# Patient Record
Sex: Male | Born: 1955
Health system: Southern US, Community
[De-identification: ages and names within clinical notes are randomized; demographics above are authoritative.]

## PROBLEM LIST (undated history)

## (undated) DIAGNOSIS — I10 Essential (primary) hypertension: Secondary | ICD-10-CM

## (undated) DIAGNOSIS — T7840XA Allergy, unspecified, initial encounter: Secondary | ICD-10-CM

## (undated) DIAGNOSIS — J189 Pneumonia, unspecified organism: Secondary | ICD-10-CM

## (undated) DIAGNOSIS — R1011 Right upper quadrant pain: Secondary | ICD-10-CM

## (undated) DIAGNOSIS — E785 Hyperlipidemia, unspecified: Secondary | ICD-10-CM

## (undated) DIAGNOSIS — J309 Allergic rhinitis, unspecified: Secondary | ICD-10-CM

## (undated) DIAGNOSIS — E669 Obesity, unspecified: Secondary | ICD-10-CM

## (undated) DIAGNOSIS — K219 Gastro-esophageal reflux disease without esophagitis: Secondary | ICD-10-CM

## (undated) DIAGNOSIS — M1990 Unspecified osteoarthritis, unspecified site: Secondary | ICD-10-CM

## (undated) DIAGNOSIS — J40 Bronchitis, not specified as acute or chronic: Secondary | ICD-10-CM

## (undated) DIAGNOSIS — E8881 Metabolic syndrome: Secondary | ICD-10-CM

## (undated) HISTORY — DX: Bronchitis, not specified as acute or chronic: J40

## (undated) HISTORY — DX: Obesity, unspecified: E66.9

## (undated) HISTORY — DX: Unspecified osteoarthritis, unspecified site: M19.90

## (undated) HISTORY — PX: ORTHOPEDIC SURGERY: SHX850

## (undated) HISTORY — DX: Gastro-esophageal reflux disease without esophagitis: K21.9

## (undated) HISTORY — DX: Hyperlipidemia, unspecified: E78.5

## (undated) HISTORY — DX: Right upper quadrant pain: R10.11

## (undated) HISTORY — PX: SPINE SURGERY: SHX786

## (undated) HISTORY — DX: Essential (primary) hypertension: I10

## (undated) HISTORY — DX: Metabolic syndrome: E88.81

## (undated) HISTORY — DX: Allergic rhinitis, unspecified: J30.9

## (undated) HISTORY — PX: APPENDECTOMY: SHX54

## (undated) HISTORY — PX: COLONOSCOPY: SHX174

## (undated) HISTORY — DX: Allergy, unspecified, initial encounter: T78.40XA

## (undated) HISTORY — DX: Metabolic syndrome: E88.810

---

## 1975-08-24 HISTORY — PX: ORTHOPEDIC SURGERY: SHX850

## 2006-05-09 ENCOUNTER — Ambulatory Visit: Payer: Self-pay | Admitting: Family Medicine

## 2006-06-21 ENCOUNTER — Ambulatory Visit: Payer: Self-pay | Admitting: Gastroenterology

## 2007-10-18 ENCOUNTER — Ambulatory Visit: Payer: Self-pay | Admitting: Internal Medicine

## 2012-01-05 ENCOUNTER — Ambulatory Visit: Payer: Self-pay | Admitting: Family Medicine

## 2012-04-12 DIAGNOSIS — E8881 Metabolic syndrome: Secondary | ICD-10-CM | POA: Insufficient documentation

## 2012-07-04 ENCOUNTER — Ambulatory Visit: Payer: Self-pay | Admitting: Family Medicine

## 2012-12-25 ENCOUNTER — Encounter: Payer: Self-pay | Admitting: *Deleted

## 2013-01-02 ENCOUNTER — Encounter: Payer: Self-pay | Admitting: Cardiovascular Disease

## 2013-01-02 ENCOUNTER — Ambulatory Visit (INDEPENDENT_AMBULATORY_CARE_PROVIDER_SITE_OTHER): Payer: BC Managed Care – PPO | Admitting: Cardiovascular Disease

## 2013-01-02 VITALS — BP 140/88 | HR 78 | Ht 72.0 in | Wt 214.2 lb

## 2013-01-02 DIAGNOSIS — E785 Hyperlipidemia, unspecified: Secondary | ICD-10-CM

## 2013-01-02 DIAGNOSIS — I10 Essential (primary) hypertension: Secondary | ICD-10-CM

## 2013-01-02 DIAGNOSIS — R079 Chest pain, unspecified: Secondary | ICD-10-CM

## 2013-01-02 NOTE — Progress Notes (Signed)
Primary care physician: Dr. Carlynn Purl  HPI  This is a 57 year old man who was referred for evaluation of chest pain and dyspnea. He has no previous cardiac history. He was diagnosed with hypertension recently and was started on lisinopril. This was switched to losartan due to rash. His blood pressure has improved since then. He reports that some of chest pain over the last few months described as aching and heartburn sensation radiating to the left shoulder. This partially improved with omeprazole. This mostly happens at rest and does not worsen with physical activities. He also reports worsening exertional dyspnea over the same time. No orthopnea, PND or lower extremity edema. He is a previous smoker. He has a strong family history of premature coronary artery disease. His mother had myocardial infarction at the age of 59 and subsequently underwent CABG twice. She died of heart disease at 17. His father had 5 cardiac stents. A younger brother had myocardial infarction at age of 71 recently.  Allergies  Allergen Reactions  . Lisinopril     Rash, pins and needles in arms     Current Outpatient Prescriptions on File Prior to Visit  Medication Sig Dispense Refill  . aspirin 81 MG tablet Take 81 mg by mouth daily.      . cyclobenzaprine (FLEXERIL) 10 MG tablet Take 10 mg by mouth every evening.      . fexofenadine-pseudoephedrine (ALLEGRA-D 12 HOUR) 60-120 MG per tablet Take 1 tablet by mouth every 12 (twelve) hours.      . Multiple Vitamin (MULTIVITAMIN) tablet Take 1 tablet by mouth daily.      Marland Kitchen omeprazole (PRILOSEC) 20 MG capsule Take 20 mg by mouth daily.       No current facility-administered medications on file prior to visit.     Past Medical History  Diagnosis Date  . Obesity, unspecified   . Esophageal reflux   . Allergic rhinitis, cause unspecified   . Osteoarthrosis, unspecified whether generalized or localized, unspecified site   . GERD (gastroesophageal reflux disease)   .  Hypertension      Past Surgical History  Procedure Laterality Date  . Orthopedic surgery       Family History  Problem Relation Age of Onset  . Heart disease Mother   . Heart attack Mother   . Heart disease Father   . Hypertension Father      History   Social History  . Marital Status: Married    Spouse Name: N/A    Number of Children: N/A  . Years of Education: N/A   Occupational History  . Not on file.   Social History Main Topics  . Smoking status: Former Smoker -- 1.00 packs/day for 15 years    Types: Cigarettes  . Smokeless tobacco: Not on file  . Alcohol Use: No  . Drug Use: No  . Sexually Active: Not on file   Other Topics Concern  . Not on file   Social History Narrative  . No narrative on file     ROS 10 point review of system was performed. It is negative other than what is mentioned in the history of present illness.  PHYSICAL EXAM   BP 140/88  Pulse 78  Ht 6' (1.829 m)  Wt 214 lb 4 oz (97.183 kg)  BMI 29.05 kg/m2 Constitutional: He is oriented to person, place, and time. He appears well-developed and well-nourished. No distress.  HENT: No nasal discharge.  Head: Normocephalic and atraumatic.  Eyes: Pupils are equal  and round. Right eye exhibits no discharge. Left eye exhibits no discharge.  Neck: Normal range of motion. Neck supple. No JVD present. No thyromegaly present.  Cardiovascular: Normal rate, regular rhythm, normal heart sounds and. Exam reveals no gallop and no friction rub. No murmur heard.  Pulmonary/Chest: Effort normal and breath sounds normal. No stridor. No respiratory distress. He has no wheezes. He has no rales. He exhibits no tenderness.  Abdominal: Soft. Bowel sounds are normal. He exhibits no distension. There is no tenderness. There is no rebound and no guarding.  Musculoskeletal: Normal range of motion. He exhibits no edema and no tenderness.  Neurological: He is alert and oriented to person, place, and time.  Coordination normal.  Skin: Skin is warm and dry. No rash noted. He is not diaphoretic. No erythema. No pallor.  Psychiatric: He has a normal mood and affect. His behavior is normal. Judgment and thought content normal.       EKG: Normal sinus rhythm with no significant ST or T wave changes.   ASSESSMENT AND PLAN

## 2013-01-02 NOTE — Assessment & Plan Note (Signed)
Some of his chest pain seems to be atypical and could be related to GERD. However, he had significant exertional dyspnea with worsening recently. His risk factors for coronary artery disease include hypertension, strong family history of premature CAD and previous tobacco use. Due to that, I recommend further evaluation with a treadmill nuclear stress test. I discussed with the patient the importance of lifestyle changes in order to decrease the chance of future coronary artery disease and cardiovascular events. We discussed the importance of controlling risk factors, healthy diet as well as regular exercise. I also explained to him that a normal stress test does not rule out atherosclerosis.  I asked him to cut down on carbohydrates and processed sugar especially sweet tea which he consumes large amount of. Given his risk factors and family history of premature CAD, I recommend continuing small dose aspirin. I will also request advanced lipid panel given his family history.

## 2013-01-02 NOTE — Assessment & Plan Note (Signed)
Blood pressure is well controlled on current medications. 

## 2013-01-02 NOTE — Patient Instructions (Addendum)
Your physician has requested that you have en exercise stress myoview. For further information please visit [email or web address] Please follow instruction sheet, as given.  Come back for fasting labs.   Follow up after tests

## 2013-01-03 ENCOUNTER — Ambulatory Visit (INDEPENDENT_AMBULATORY_CARE_PROVIDER_SITE_OTHER): Payer: BC Managed Care – PPO

## 2013-01-03 DIAGNOSIS — E785 Hyperlipidemia, unspecified: Secondary | ICD-10-CM

## 2013-01-03 NOTE — Addendum Note (Signed)
Addended by: Kendrick Fries on: 01/03/2013 09:39 AM   Modules accepted: Orders

## 2013-01-05 ENCOUNTER — Ambulatory Visit: Payer: Self-pay | Admitting: Cardiovascular Disease

## 2013-01-05 ENCOUNTER — Other Ambulatory Visit: Payer: Self-pay

## 2013-01-05 DIAGNOSIS — R079 Chest pain, unspecified: Secondary | ICD-10-CM

## 2013-01-05 LAB — LIPID PANEL
HDL: 43 mg/dL (ref >39)
Triglycerides: 109 mg/dL (ref 0–149)

## 2013-01-06 LAB — NMR, LIPOPROFILE
LDL Particle Number: 1720 nmol/L — ABNORMAL HIGH (ref <1000)
LDL Size: 20.5 nm — ABNORMAL LOW (ref >20.5)
LDLC SERPL CALC-MCNC: 141 mg/dL — ABNORMAL HIGH (ref <100)
LP-IR Score: 51 — ABNORMAL HIGH (ref <=45)

## 2013-01-12 ENCOUNTER — Ambulatory Visit: Payer: Self-pay | Admitting: Cardiovascular Disease

## 2013-01-12 ENCOUNTER — Ambulatory Visit (INDEPENDENT_AMBULATORY_CARE_PROVIDER_SITE_OTHER): Payer: BC Managed Care – PPO | Admitting: Cardiovascular Disease

## 2013-01-12 ENCOUNTER — Encounter: Payer: Self-pay | Admitting: Cardiovascular Disease

## 2013-01-12 VITALS — BP 112/82 | HR 60 | Ht 72.0 in | Wt 215.2 lb

## 2013-01-12 DIAGNOSIS — I10 Essential (primary) hypertension: Secondary | ICD-10-CM

## 2013-01-12 DIAGNOSIS — R079 Chest pain, unspecified: Secondary | ICD-10-CM

## 2013-01-12 DIAGNOSIS — R0789 Other chest pain: Secondary | ICD-10-CM

## 2013-01-12 DIAGNOSIS — E785 Hyperlipidemia, unspecified: Secondary | ICD-10-CM

## 2013-01-12 MED ORDER — LOSARTAN POTASSIUM 50 MG PO TABS: 50.0000 mg | ORAL_TABLET | Freq: Every day | ORAL | Status: DC

## 2013-01-12 MED ORDER — ATORVASTATIN CALCIUM 20 MG PO TABS: 20.0000 mg | ORAL_TABLET | Freq: Every day | ORAL | Status: DC

## 2013-01-12 MED ORDER — OMEPRAZOLE 20 MG PO CPDR: 20.0000 mg | DELAYED_RELEASE_CAPSULE | Freq: Every day | ORAL | Status: DC

## 2013-01-12 NOTE — Patient Instructions (Addendum)
Start Atorvastatin 20 mg at bedtime.  Recheck labs (fasting) in 6 weeks.  Follow up in 3 months.

## 2013-01-12 NOTE — Assessment & Plan Note (Signed)
His blood pressure is well controlled on current dose of losartan.

## 2013-01-12 NOTE — Progress Notes (Signed)
Primary care physician: Dr. Carlynn Purl  HPI  This is a 57 year old man who is here today for a followup visit regarding chest pain, dyspnea and hyperlipidemia. He has no previous cardiac history. He was diagnosed with hypertension recently and was started on lisinopril. This was switched to losartan due to rash. His blood pressure has improved since then. He was seen for atypical chest pain and heartburn. This partially improved with omeprazole.  He also reported worsening exertional dyspnea over the same time. No orthopnea, PND or lower extremity edema. He is a previous smoker. He has a strong family history of premature coronary artery disease. His mother had myocardial infarction at the age of 34 and subsequently underwent CABG twice. She died of heart disease at 43. His father had 5 cardiac stents. A younger brother had myocardial infarction at age of 70 recently. He underwent a treadmill nuclear stress test which showed no evidence of ischemia with normal ejection fraction. He was able to exercise for more than 9 minutes with no reproducible chest pain or EKG changes. He is overall feeling better and reports no recurrent chest pain.  Allergies  Allergen Reactions  . Lisinopril     Rash, pins and needles in arms     Current Outpatient Prescriptions on File Prior to Visit  Medication Sig Dispense Refill  . aspirin 81 MG tablet Take 81 mg by mouth daily.      . cyclobenzaprine (FLEXERIL) 10 MG tablet Take 10 mg by mouth as needed.       . fexofenadine-pseudoephedrine (ALLEGRA-D 12 HOUR) 60-120 MG per tablet Take 1 tablet by mouth every 12 (twelve) hours.      . fish oil-omega-3 fatty acids 1000 MG capsule Take 1 g by mouth daily.       Marland Kitchen glucosamine-chondroitin 500-400 MG tablet Take 1 tablet by mouth daily.       Marland Kitchen losartan (COZAAR) 50 MG tablet Take 50 mg by mouth daily.      . Multiple Vitamin (MULTIVITAMIN) tablet Take 1 tablet by mouth daily.      Marland Kitchen omeprazole (PRILOSEC) 20 MG capsule  Take 20 mg by mouth daily.       No current facility-administered medications on file prior to visit.     Past Medical History  Diagnosis Date  . Obesity, unspecified   . Esophageal reflux   . Allergic rhinitis, cause unspecified   . Osteoarthrosis, unspecified whether generalized or localized, unspecified site   . GERD (gastroesophageal reflux disease)   . Hypertension      Past Surgical History  Procedure Laterality Date  . Orthopedic surgery       Family History  Problem Relation Age of Onset  . Heart disease Mother   . Heart attack Mother   . Heart disease Father   . Hypertension Father      History   Social History  . Marital Status: Married    Spouse Name: N/A    Number of Children: N/A  . Years of Education: N/A   Occupational History  . Not on file.   Social History Main Topics  . Smoking status: Former Smoker -- 1.00 packs/day for 15 years    Types: Cigarettes  . Smokeless tobacco: Not on file  . Alcohol Use: No  . Drug Use: No  . Sexually Active: Not on file   Other Topics Concern  . Not on file   Social History Narrative  . No narrative on file  ROS 10 point review of system was performed. It is negative other than what is mentioned in the history of present illness.  PHYSICAL EXAM   BP 112/82  Pulse 60  Ht 6' (1.829 m)  Wt 215 lb 4 oz (97.637 kg)  BMI 29.19 kg/m2 Constitutional: He is oriented to person, place, and time. He appears well-developed and well-nourished. No distress.  HENT: No nasal discharge.  Head: Normocephalic and atraumatic.  Eyes: Pupils are equal and round. Right eye exhibits no discharge. Left eye exhibits no discharge.  Neck: Normal range of motion. Neck supple. No JVD present. No thyromegaly present.  Cardiovascular: Normal rate, regular rhythm, normal heart sounds and. Exam reveals no gallop and no friction rub. No murmur heard.  Pulmonary/Chest: Effort normal and breath sounds normal. No stridor. No  respiratory distress. He has no wheezes. He has no rales. He exhibits no tenderness.  Abdominal: Soft. Bowel sounds are normal. He exhibits no distension. There is no tenderness. There is no rebound and no guarding.  Musculoskeletal: Normal range of motion. He exhibits no edema and no tenderness.  Neurological: He is alert and oriented to person, place, and time. Coordination normal.  Skin: Skin is warm and dry. No rash noted. He is not diaphoretic. No erythema. No pallor.  Psychiatric: He has a normal mood and affect. His behavior is normal. Judgment and thought content normal.       EKG: Normal sinus rhythm with no significant ST or T wave changes.   ASSESSMENT AND PLAN

## 2013-01-12 NOTE — Assessment & Plan Note (Addendum)
His chest pain was likely due to GERD. Nuclear stress test showed no evidence of ischemia. Continue treatment with omeprazole for another month. Due to his risk factors and family history, I recommend that he stays on aspirin 81 mg once daily.

## 2013-01-12 NOTE — Assessment & Plan Note (Signed)
Lab Results  Component Value Date   CHOL 210* 01/03/2013   HDL 43 01/03/2013   LDLCALC 143* 01/03/2013   TRIG 109 01/03/2013   CHOLHDL 4.8 01/03/2013   I did more detailed cholesterol testing which showed an LDL particle number of 1720 with a small LDL particle number of 939 with a slightly small size LDL. All of this, but high risk for future cardiovascular events. Given his strong family history of premature coronary artery disease and based on this, I recommend starting treatment with atorvastatin 20 mg once. Recheck lipid and liver profile in 6 weeks. We also discussed lifestyle changes.

## 2013-01-25 ENCOUNTER — Ambulatory Visit: Payer: Self-pay | Admitting: Family Medicine

## 2013-02-27 ENCOUNTER — Ambulatory Visit (INDEPENDENT_AMBULATORY_CARE_PROVIDER_SITE_OTHER): Payer: BC Managed Care – PPO

## 2013-02-27 DIAGNOSIS — E785 Hyperlipidemia, unspecified: Secondary | ICD-10-CM

## 2013-02-28 LAB — LIPID PANEL
Chol/HDL Ratio: 3.2 ratio units (ref 0.0–5.0)
Triglycerides: 98 mg/dL (ref 0–149)

## 2013-03-01 LAB — NMR, LIPOPROFILE
Cholesterol: 140 mg/dL (ref <200)
LDL Size: 20.4 nm — ABNORMAL LOW (ref >20.5)
Small LDL Particle Number: 542 nmol/L — ABNORMAL HIGH (ref <=527)
Triglycerides by NMR: 108 mg/dL (ref <150)

## 2013-04-17 ENCOUNTER — Encounter: Payer: Self-pay | Admitting: Cardiovascular Disease

## 2013-04-17 ENCOUNTER — Ambulatory Visit (INDEPENDENT_AMBULATORY_CARE_PROVIDER_SITE_OTHER): Payer: BC Managed Care – PPO | Admitting: Cardiovascular Disease

## 2013-04-17 VITALS — BP 121/84 | HR 80 | Ht 72.0 in | Wt 217.2 lb

## 2013-04-17 DIAGNOSIS — E785 Hyperlipidemia, unspecified: Secondary | ICD-10-CM

## 2013-04-17 DIAGNOSIS — I10 Essential (primary) hypertension: Secondary | ICD-10-CM

## 2013-04-17 DIAGNOSIS — R079 Chest pain, unspecified: Secondary | ICD-10-CM

## 2013-04-17 DIAGNOSIS — R0789 Other chest pain: Secondary | ICD-10-CM

## 2013-04-17 LAB — PSA: PSA: NORMAL

## 2013-04-17 LAB — HEMOGLOBIN A1C: Hgb A1c MFr Bld: 5.9 % (ref 4.0–6.0)

## 2013-04-17 MED ORDER — OMEPRAZOLE 20 MG PO CPDR: 20.0000 mg | DELAYED_RELEASE_CAPSULE | Freq: Every day | ORAL | Status: DC

## 2013-04-17 NOTE — Progress Notes (Signed)
Primary care physician: Dr. Carlynn Purl  HPI  This is a 57 year old man who is here today for a followup visit regarding chest pain, hypertension and hyperlipidemia. He has no previous cardiac history. He does a family history of CAD. He was evaluated few months ago for atypical chest pain. He underwent a treadmill nuclear stress test which showed no evidence of ischemia with normal ejection fraction. He was able to exercise for more than 9 minutes with no reproducible chest pain or EKG changes. Chest pain improved with Omeprazole.  He was started on Atorvastatin for hyperlipidemia given his family history of CAD.  He reports brief episodes of chest pain recently at rest and not with physical activities. He has been under stress test after recent death of his father.      Allergies  Allergen Reactions  . Lisinopril     Rash, pins and needles in arms     Current Outpatient Prescriptions on File Prior to Visit  Medication Sig Dispense Refill  . aspirin 81 MG tablet Take 81 mg by mouth daily.      Marland Kitchen atorvastatin (LIPITOR) 20 MG tablet Take 1 tablet (20 mg total) by mouth daily.  30 tablet  6  . cyclobenzaprine (FLEXERIL) 10 MG tablet Take 10 mg by mouth as needed.       . fexofenadine-pseudoephedrine (ALLEGRA-D 12 HOUR) 60-120 MG per tablet Take 1 tablet by mouth every 12 (twelve) hours.      . fish oil-omega-3 fatty acids 1000 MG capsule Take 1 g by mouth daily.       Marland Kitchen glucosamine-chondroitin 500-400 MG tablet Take 1 tablet by mouth daily.       Marland Kitchen losartan (COZAAR) 50 MG tablet Take 1 tablet (50 mg total) by mouth daily.  30 tablet  6  . Multiple Vitamin (MULTIVITAMIN) tablet Take 1 tablet by mouth daily.      Marland Kitchen omeprazole (PRILOSEC) 20 MG capsule Take 1 capsule (20 mg total) by mouth daily.  30 capsule  0   No current facility-administered medications on file prior to visit.     Past Medical History  Diagnosis Date  . Obesity, unspecified   . Esophageal reflux   . Allergic  rhinitis, cause unspecified   . Osteoarthrosis, unspecified whether generalized or localized, unspecified site   . GERD (gastroesophageal reflux disease)   . Hypertension   . Hyperlipidemia      Past Surgical History  Procedure Laterality Date  . Orthopedic surgery       Family History  Problem Relation Age of Onset  . Heart disease Mother   . Heart attack Mother   . Heart disease Father   . Hypertension Father      History   Social History  . Marital Status: Married    Spouse Name: N/A    Number of Children: N/A  . Years of Education: N/A   Occupational History  . Not on file.   Social History Main Topics  . Smoking status: Former Smoker -- 1.00 packs/day for 15 years    Types: Cigarettes  . Smokeless tobacco: Not on file  . Alcohol Use: No  . Drug Use: No  . Sexual Activity: Not on file   Other Topics Concern  . Not on file   Social History Narrative  . No narrative on file      PHYSICAL EXAM   BP 121/84  Ht 6' (1.829 m)  Wt 217 lb 4 oz (98.544 kg)  BMI  29.46 kg/m2 Constitutional: He is oriented to person, place, and time. He appears well-developed and well-nourished. No distress.  HENT: No nasal discharge.  Head: Normocephalic and atraumatic.  Eyes: Pupils are equal and round. Right eye exhibits no discharge. Left eye exhibits no discharge.  Neck: Normal range of motion. Neck supple. No JVD present. No thyromegaly present.  Cardiovascular: Normal rate, regular rhythm, normal heart sounds and. Exam reveals no gallop and no friction rub. No murmur heard.  Pulmonary/Chest: Effort normal and breath sounds normal. No stridor. No respiratory distress. He has no wheezes. He has no rales. He exhibits no tenderness.  Abdominal: Soft. Bowel sounds are normal. He exhibits no distension. There is no tenderness. There is no rebound and no guarding.  Musculoskeletal: Normal range of motion. He exhibits no edema and no tenderness.  Neurological: He is alert and  oriented to person, place, and time. Coordination normal.  Skin: Skin is warm and dry. No rash noted. He is not diaphoretic. No erythema. No pallor.  Psychiatric: He has a normal mood and affect. His behavior is normal. Judgment and thought content normal.       EKG: Normal sinus rhythm with no significant ST or T wave changes.   ASSESSMENT AND PLAN

## 2013-04-17 NOTE — Assessment & Plan Note (Signed)
Lab Results  Component Value Date   CHOL 140 02/27/2013   HDL 43 02/27/2013   LDLCALC 74 02/27/2013   TRIG 98 02/27/2013   CHOLHDL 3.2 02/27/2013   Significant improvement after adding Atorvastatin which is well tolerated so far.

## 2013-04-17 NOTE — Assessment & Plan Note (Signed)
BP is well controlled on Losartan.

## 2013-04-17 NOTE — Assessment & Plan Note (Signed)
Few recurrent episodes at chest pain at rest. Seems to be triggered by anxiety. Recent negative stress test. I discussed the option of proceeding with cardiac cath for a definitive diagnosis. He will monitor his symptoms and let us know.

## 2013-04-17 NOTE — Patient Instructions (Addendum)
Continue same medications.  Follow up in 6 months.  

## 2013-04-18 ENCOUNTER — Other Ambulatory Visit: Payer: Self-pay

## 2013-04-18 DIAGNOSIS — Z79899 Other long term (current) drug therapy: Secondary | ICD-10-CM

## 2013-04-18 DIAGNOSIS — E785 Hyperlipidemia, unspecified: Secondary | ICD-10-CM

## 2013-04-19 ENCOUNTER — Ambulatory Visit (INDEPENDENT_AMBULATORY_CARE_PROVIDER_SITE_OTHER): Payer: BC Managed Care – PPO

## 2013-04-19 DIAGNOSIS — E785 Hyperlipidemia, unspecified: Secondary | ICD-10-CM

## 2013-04-19 DIAGNOSIS — Z79899 Other long term (current) drug therapy: Secondary | ICD-10-CM

## 2013-04-20 ENCOUNTER — Telehealth: Payer: Self-pay

## 2013-04-20 LAB — HEPATIC FUNCTION PANEL
Albumin: 4.5 g/dL (ref 3.5–5.5)
Alkaline Phosphatase: 67 IU/L (ref 39–117)
Bilirubin, Direct: 0.13 mg/dL (ref 0.00–0.40)
Total Bilirubin: 0.6 mg/dL (ref 0.0–1.2)
Total Protein: 6.3 g/dL (ref 6.0–8.5)

## 2013-04-20 NOTE — Telephone Encounter (Signed)
Message copied by Marilynne Halsted on Fri Apr 20, 2013  9:58 AM ------      Message from: Lorine Bears A      Created: Fri Apr 20, 2013  9:08 AM       Inform patient that liver labs were normal. ------

## 2013-04-20 NOTE — Telephone Encounter (Signed)
Lmom informing pt of normal lab results and to call with any questions or concerns.

## 2013-06-28 ENCOUNTER — Other Ambulatory Visit: Payer: Self-pay

## 2013-07-10 ENCOUNTER — Other Ambulatory Visit: Payer: Self-pay | Admitting: *Deleted

## 2013-07-10 MED ORDER — ATORVASTATIN CALCIUM 20 MG PO TABS: 20.0000 mg | ORAL_TABLET | Freq: Every day | ORAL | Status: DC

## 2013-07-10 NOTE — Telephone Encounter (Signed)
Requested Prescriptions   Signed Prescriptions Disp Refills  . atorvastatin (LIPITOR) 20 MG tablet 30 tablet 6    Sig: Take 1 tablet (20 mg total) by mouth daily.    Authorizing Provider: Antonieta Iba    Ordering User: Kendrick Fries

## 2013-10-18 ENCOUNTER — Ambulatory Visit: Payer: BC Managed Care – PPO | Admitting: Cardiovascular Disease

## 2013-10-22 ENCOUNTER — Encounter: Payer: Self-pay | Admitting: Cardiovascular Disease

## 2013-10-22 ENCOUNTER — Ambulatory Visit (INDEPENDENT_AMBULATORY_CARE_PROVIDER_SITE_OTHER): Payer: BC Managed Care – PPO | Admitting: Cardiovascular Disease

## 2013-10-22 VITALS — BP 143/88 | HR 60 | Ht 71.0 in | Wt 218.8 lb

## 2013-10-22 DIAGNOSIS — E785 Hyperlipidemia, unspecified: Secondary | ICD-10-CM

## 2013-10-22 DIAGNOSIS — I10 Essential (primary) hypertension: Secondary | ICD-10-CM

## 2013-10-22 MED ORDER — ATORVASTATIN CALCIUM 20 MG PO TABS: 20.0000 mg | ORAL_TABLET | Freq: Every day | ORAL | Status: DC

## 2013-10-22 MED ORDER — LOSARTAN POTASSIUM 50 MG PO TABS: 50.0000 mg | ORAL_TABLET | Freq: Every day | ORAL | Status: DC

## 2013-10-22 NOTE — Progress Notes (Signed)
Primary care physician: Dr. Ancil Boozer  HPI  This is a 58 year old man who is here today for a followup visit regarding chest pain, hypertension and hyperlipidemia. He has no previous cardiac history. He does have a family history of CAD. He was evaluated last year for atypical chest pain. He underwent a treadmill nuclear stress test which showed no evidence of ischemia with normal ejection fraction. He was able to exercise for more than 9 minutes with no reproducible chest pain or EKG changes. Chest pain improved with Omeprazole.  He was started on Atorvastatin for hyperlipidemia given his family history of CAD.  He reports no further episodes of chest pain. He is tolerating treatment with atorvastatin.     Allergies  Allergen Reactions  . Lisinopril     Rash, pins and needles in arms     Current Outpatient Prescriptions on File Prior to Visit  Medication Sig Dispense Refill  . aspirin 81 MG tablet Take 81 mg by mouth daily.      . cyclobenzaprine (FLEXERIL) 10 MG tablet Take 10 mg by mouth as needed.       . fexofenadine-pseudoephedrine (ALLEGRA-D 12 HOUR) 60-120 MG per tablet Take 1 tablet by mouth every 12 (twelve) hours.      . fish oil-omega-3 fatty acids 1000 MG capsule Take 1 g by mouth daily.       Marland Kitchen glucosamine-chondroitin 500-400 MG tablet Take 1 tablet by mouth daily.       . Multiple Vitamin (MULTIVITAMIN) tablet Take 1 tablet by mouth daily.      Marland Kitchen omeprazole (PRILOSEC) 20 MG capsule Take 1 capsule (20 mg total) by mouth daily.  30 capsule  6   No current facility-administered medications on file prior to visit.     Past Medical History  Diagnosis Date  . Obesity, unspecified   . Esophageal reflux   . Allergic rhinitis, cause unspecified   . Osteoarthrosis, unspecified whether generalized or localized, unspecified site   . GERD (gastroesophageal reflux disease)   . Hypertension   . Hyperlipidemia      Past Surgical History  Procedure Laterality Date  .  Orthopedic surgery       Family History  Problem Relation Age of Onset  . Heart disease Mother   . Heart attack Mother   . Heart disease Father   . Hypertension Father      History   Social History  . Marital Status: Married    Spouse Name: N/A    Number of Children: N/A  . Years of Education: N/A   Occupational History  . Not on file.   Social History Main Topics  . Smoking status: Former Smoker -- 1.00 packs/day for 15 years    Types: Cigarettes  . Smokeless tobacco: Not on file  . Alcohol Use: Yes     Comment: ocassional  . Drug Use: No  . Sexual Activity: Not on file   Other Topics Concern  . Not on file   Social History Narrative  . No narrative on file      PHYSICAL EXAM   BP 143/88  Pulse 60  Ht 5\' 11"  (1.803 m)  Wt 218 lb 12 oz (99.224 kg)  BMI 30.52 kg/m2 Constitutional: He is oriented to person, place, and time. He appears well-developed and well-nourished. No distress.  HENT: No nasal discharge.  Head: Normocephalic and atraumatic.  Eyes: Pupils are equal and round. Right eye exhibits no discharge. Left eye exhibits no discharge.  Neck:  Normal range of motion. Neck supple. No JVD present. No thyromegaly present.  Cardiovascular: Normal rate, regular rhythm, normal heart sounds and. Exam reveals no gallop and no friction rub. No murmur heard.  Pulmonary/Chest: Effort normal and breath sounds normal. No stridor. No respiratory distress. He has no wheezes. He has no rales. He exhibits no tenderness.  Abdominal: Soft. Bowel sounds are normal. He exhibits no distension. There is no tenderness. There is no rebound and no guarding.  Musculoskeletal: Normal range of motion. He exhibits no edema and no tenderness.  Neurological: He is alert and oriented to person, place, and time. Coordination normal.  Skin: Skin is warm and dry. No rash noted. He is not diaphoretic. No erythema. No pallor.  Psychiatric: He has a normal mood and affect. His behavior is  normal. Judgment and thought content normal.       EKG: Normal sinus rhythm with no significant ST or T wave changes.   ASSESSMENT AND PLAN

## 2013-10-22 NOTE — Assessment & Plan Note (Signed)
Lab Results  Component Value Date   CHOL 140 02/27/2013   HDL 43 02/27/2013   LDLCALC 74 02/27/2013   TRIG 98 02/27/2013   CHOLHDL 3.2 02/27/2013   Continue treatment with atorvastatin. HDL was noted to be low. I advised him to start an aerobic exercise program.

## 2013-10-22 NOTE — Assessment & Plan Note (Signed)
Blood pressure is reasonably controlled on losartan.

## 2013-10-22 NOTE — Patient Instructions (Signed)
Continue same medications.   Your physician wants you to follow-up in: 12 months.  You will receive a reminder letter in the mail two months in advance. If you don't receive a letter, please call our office to schedule the follow-up appointment.

## 2014-05-14 ENCOUNTER — Other Ambulatory Visit: Payer: Self-pay | Admitting: *Deleted

## 2014-05-14 MED ORDER — OMEPRAZOLE 20 MG PO CPDR: 20.0000 mg | DELAYED_RELEASE_CAPSULE | Freq: Every day | ORAL | Status: DC

## 2014-05-14 NOTE — Telephone Encounter (Signed)
Requested Prescriptions   Signed Prescriptions Disp Refills  . omeprazole (PRILOSEC) 20 MG capsule 30 capsule 3    Sig: Take 1 capsule (20 mg total) by mouth daily.    Authorizing Provider: Kathlyn Sacramento A    Ordering User: Britt Bottom

## 2014-09-09 ENCOUNTER — Other Ambulatory Visit: Payer: Self-pay | Admitting: *Deleted

## 2014-09-16 ENCOUNTER — Other Ambulatory Visit: Payer: Self-pay | Admitting: *Deleted

## 2014-09-16 MED ORDER — OMEPRAZOLE 20 MG PO CPDR: 20.0000 mg | DELAYED_RELEASE_CAPSULE | Freq: Every day | ORAL | Status: DC

## 2014-10-17 ENCOUNTER — Ambulatory Visit: Payer: Self-pay | Admitting: Gastroenterology

## 2014-10-17 LAB — HM COLONOSCOPY

## 2014-10-24 ENCOUNTER — Ambulatory Visit: Payer: BC Managed Care – PPO | Admitting: Cardiovascular Disease

## 2014-11-04 ENCOUNTER — Encounter: Payer: Self-pay | Admitting: Cardiovascular Disease

## 2014-11-04 ENCOUNTER — Ambulatory Visit (INDEPENDENT_AMBULATORY_CARE_PROVIDER_SITE_OTHER): Payer: BLUE CROSS/BLUE SHIELD | Admitting: Cardiovascular Disease

## 2014-11-04 VITALS — BP 130/82 | HR 73 | Ht 71.0 in | Wt 224.0 lb

## 2014-11-04 DIAGNOSIS — I159 Secondary hypertension, unspecified: Secondary | ICD-10-CM

## 2014-11-04 DIAGNOSIS — E785 Hyperlipidemia, unspecified: Secondary | ICD-10-CM

## 2014-11-04 MED ORDER — LOSARTAN POTASSIUM 50 MG PO TABS: 50.0000 mg | ORAL_TABLET | Freq: Every day | ORAL | Status: DC

## 2014-11-04 MED ORDER — ATORVASTATIN CALCIUM 20 MG PO TABS: 20.0000 mg | ORAL_TABLET | Freq: Every day | ORAL | Status: DC

## 2014-11-04 NOTE — Assessment & Plan Note (Signed)
Continue treatment with atorvastatin. I requested fasting lipid and liver profile. 

## 2014-11-04 NOTE — Patient Instructions (Signed)
Continue same medications.   Return for fasting labs.   Your physician wants you to follow-up in: 1 year.  You will receive a reminder letter in the mail two months in advance. If you don't receive a letter, please call our office to schedule the follow-up appointment.

## 2014-11-04 NOTE — Assessment & Plan Note (Signed)
Blood pressure is well-controlled on losartan.  

## 2014-11-04 NOTE — Progress Notes (Signed)
Primary care physician: Dr. Ancil Boozer  HPI  This is a 59 year old man who is here today for a followup visit regarding hypertension and hyperlipidemia. He has no previous cardiac history. He does have a family history of CAD. He was evaluated in 2014 for atypical chest pain. He underwent a treadmill nuclear stress test which showed no evidence of ischemia with normal ejection fraction. He was able to exercise for more than 9 minutes with no reproducible chest pain or EKG changes. Chest pain improved with Omeprazole.  He was started on Atorvastatin for hyperlipidemia given his family history of CAD.  He has been doing very well and denies any chest pain or shortness of breath.     Allergies  Allergen Reactions  . Lisinopril     Rash, pins and needles in arms     Current Outpatient Prescriptions on File Prior to Visit  Medication Sig Dispense Refill  . aspirin 81 MG tablet Take 81 mg by mouth daily.    . cyclobenzaprine (FLEXERIL) 10 MG tablet Take 10 mg by mouth as needed.     . etodolac (LODINE) 500 MG tablet Take 500 mg by mouth 2 (two) times daily.    . fexofenadine-pseudoephedrine (ALLEGRA-D 12 HOUR) 60-120 MG per tablet Take 1 tablet by mouth every 12 (twelve) hours.    . fish oil-omega-3 fatty acids 1000 MG capsule Take 1 g by mouth daily.     Marland Kitchen glucosamine-chondroitin 500-400 MG tablet Take 1 tablet by mouth daily.     Marland Kitchen HYDROcodone-acetaminophen (NORCO/VICODIN) 5-325 MG per tablet Take 1 tablet by mouth every 6 (six) hours as needed for moderate pain.    . Multiple Vitamin (MULTIVITAMIN) tablet Take 1 tablet by mouth daily.     No current facility-administered medications on file prior to visit.     Past Medical History  Diagnosis Date  . Obesity, unspecified   . Esophageal reflux   . Allergic rhinitis, cause unspecified   . Osteoarthrosis, unspecified whether generalized or localized, unspecified site   . GERD (gastroesophageal reflux disease)   . Hypertension   .  Hyperlipidemia      Past Surgical History  Procedure Laterality Date  . Orthopedic surgery    . Colonoscopy       Family History  Problem Relation Age of Onset  . Heart disease Mother   . Heart attack Mother   . Heart disease Father   . Hypertension Father      History   Social History  . Marital Status: Married    Spouse Name: N/A  . Number of Children: N/A  . Years of Education: N/A   Occupational History  . Not on file.   Social History Main Topics  . Smoking status: Former Smoker -- 1.00 packs/day for 15 years    Types: Cigarettes  . Smokeless tobacco: Not on file  . Alcohol Use: Yes     Comment: ocassional  . Drug Use: No  . Sexual Activity: Not on file   Other Topics Concern  . Not on file   Social History Narrative      PHYSICAL EXAM   BP 130/82 mmHg  Pulse 73  Ht 5\' 11"  (1.803 m)  Wt 224 lb (101.606 kg)  BMI 31.26 kg/m2 Constitutional: He is oriented to person, place, and time. He appears well-developed and well-nourished. No distress.  HENT: No nasal discharge.  Head: Normocephalic and atraumatic.  Eyes: Pupils are equal and round. Right eye exhibits no discharge. Left eye  exhibits no discharge.  Neck: Normal range of motion. Neck supple. No JVD present. No thyromegaly present.  Cardiovascular: Normal rate, regular rhythm, normal heart sounds and. Exam reveals no gallop and no friction rub. No murmur heard.  Pulmonary/Chest: Effort normal and breath sounds normal. No stridor. No respiratory distress. He has no wheezes. He has no rales. He exhibits no tenderness.  Abdominal: Soft. Bowel sounds are normal. He exhibits no distension. There is no tenderness. There is no rebound and no guarding.  Musculoskeletal: Normal range of motion. He exhibits no edema and no tenderness.  Neurological: He is alert and oriented to person, place, and time. Coordination normal.  Skin: Skin is warm and dry. No rash noted. He is not diaphoretic. No erythema. No  pallor.  Psychiatric: He has a normal mood and affect. His behavior is normal. Judgment and thought content normal.       EKG: Normal sinus rhythm with no significant ST or T wave changes.   ASSESSMENT AND PLAN

## 2014-11-07 ENCOUNTER — Other Ambulatory Visit: Payer: BLUE CROSS/BLUE SHIELD

## 2014-11-11 ENCOUNTER — Other Ambulatory Visit (INDEPENDENT_AMBULATORY_CARE_PROVIDER_SITE_OTHER): Payer: BLUE CROSS/BLUE SHIELD

## 2014-11-11 DIAGNOSIS — E785 Hyperlipidemia, unspecified: Secondary | ICD-10-CM

## 2014-11-11 DIAGNOSIS — I159 Secondary hypertension, unspecified: Secondary | ICD-10-CM

## 2014-11-13 LAB — LIPID PANEL
Chol/HDL Ratio: 3.1 ratio units (ref 0.0–5.0)
Cholesterol, Total: 146 mg/dL (ref 100–199)
HDL: 47 mg/dL (ref >39)
LDL Calculated: 77 mg/dL (ref 0–99)
TRIGLYCERIDES: 112 mg/dL (ref 0–149)
VLDL Cholesterol Cal: 22 mg/dL (ref 5–40)

## 2014-11-13 LAB — BASIC METABOLIC PANEL
BUN/Creatinine Ratio: 15 (ref 9–20)
BUN: 16 mg/dL (ref 6–24)
CO2: 22 mmol/L (ref 18–29)
CREATININE: 1.09 mg/dL (ref 0.76–1.27)
Calcium: 9.1 mg/dL (ref 8.7–10.2)
Chloride: 104 mmol/L (ref 97–108)
GFR, EST AFRICAN AMERICAN: 85 mL/min/{1.73_m2} (ref >59)
GFR, EST NON AFRICAN AMERICAN: 74 mL/min/{1.73_m2} (ref >59)
GLUCOSE: 92 mg/dL (ref 65–99)
Potassium: 5.1 mmol/L (ref 3.5–5.2)
Sodium: 144 mmol/L (ref 134–144)

## 2014-11-13 LAB — HEPATIC FUNCTION PANEL
ALK PHOS: 68 IU/L (ref 39–117)
ALT: 18 IU/L (ref 0–44)
AST: 23 IU/L (ref 0–40)
Albumin: 4.7 g/dL (ref 3.5–5.5)
Bilirubin Total: 0.5 mg/dL (ref 0.0–1.2)
Bilirubin, Direct: 0.15 mg/dL (ref 0.00–0.40)
TOTAL PROTEIN: 6.7 g/dL (ref 6.0–8.5)

## 2015-03-06 HISTORY — PX: SKIN CANCER DESTRUCTION: SHX778

## 2015-03-17 ENCOUNTER — Encounter: Payer: Self-pay | Admitting: Family Medicine

## 2015-03-17 DIAGNOSIS — E669 Obesity, unspecified: Secondary | ICD-10-CM | POA: Insufficient documentation

## 2015-03-17 DIAGNOSIS — M1990 Unspecified osteoarthritis, unspecified site: Secondary | ICD-10-CM | POA: Insufficient documentation

## 2015-03-17 DIAGNOSIS — J302 Other seasonal allergic rhinitis: Secondary | ICD-10-CM | POA: Insufficient documentation

## 2015-03-17 DIAGNOSIS — Z8249 Family history of ischemic heart disease and other diseases of the circulatory system: Secondary | ICD-10-CM | POA: Insufficient documentation

## 2015-03-17 DIAGNOSIS — J3089 Other allergic rhinitis: Secondary | ICD-10-CM

## 2015-03-17 DIAGNOSIS — K219 Gastro-esophageal reflux disease without esophagitis: Secondary | ICD-10-CM | POA: Insufficient documentation

## 2015-03-28 ENCOUNTER — Encounter: Payer: Self-pay | Admitting: Family Medicine

## 2015-05-22 ENCOUNTER — Telehealth: Payer: Self-pay | Admitting: Family Medicine

## 2015-05-22 DIAGNOSIS — E8881 Metabolic syndrome: Secondary | ICD-10-CM

## 2015-05-22 DIAGNOSIS — I10 Essential (primary) hypertension: Secondary | ICD-10-CM

## 2015-05-22 DIAGNOSIS — Z125 Encounter for screening for malignant neoplasm of prostate: Secondary | ICD-10-CM

## 2015-05-22 DIAGNOSIS — E785 Hyperlipidemia, unspecified: Secondary | ICD-10-CM

## 2015-05-22 NOTE — Telephone Encounter (Signed)
Patient has annual cpe scheduled for 05-26-15 @ 11:15. Would like to know if you are able to print him a lab slip before his appointment. Please return call once complete.

## 2015-05-22 NOTE — Telephone Encounter (Signed)
Ready for pick up. Please notify patient

## 2015-05-26 ENCOUNTER — Other Ambulatory Visit: Payer: Self-pay | Admitting: Family Medicine

## 2015-05-26 ENCOUNTER — Encounter: Payer: Self-pay | Admitting: Family Medicine

## 2015-05-26 ENCOUNTER — Ambulatory Visit (INDEPENDENT_AMBULATORY_CARE_PROVIDER_SITE_OTHER): Payer: BLUE CROSS/BLUE SHIELD | Admitting: Family Medicine

## 2015-05-26 VITALS — BP 136/72 | HR 87 | Temp 98.6°F | Resp 14 | Ht 70.0 in | Wt 224.7 lb

## 2015-05-26 DIAGNOSIS — Z85828 Personal history of other malignant neoplasm of skin: Secondary | ICD-10-CM | POA: Insufficient documentation

## 2015-05-26 DIAGNOSIS — Z23 Encounter for immunization: Secondary | ICD-10-CM

## 2015-05-26 DIAGNOSIS — E785 Hyperlipidemia, unspecified: Secondary | ICD-10-CM

## 2015-05-26 DIAGNOSIS — F439 Reaction to severe stress, unspecified: Secondary | ICD-10-CM

## 2015-05-26 DIAGNOSIS — Z0000 Encounter for general adult medical examination without abnormal findings: Secondary | ICD-10-CM | POA: Diagnosis not present

## 2015-05-26 DIAGNOSIS — I10 Essential (primary) hypertension: Secondary | ICD-10-CM

## 2015-05-26 DIAGNOSIS — Z658 Other specified problems related to psychosocial circumstances: Secondary | ICD-10-CM

## 2015-05-26 DIAGNOSIS — K219 Gastro-esophageal reflux disease without esophagitis: Secondary | ICD-10-CM | POA: Insufficient documentation

## 2015-05-26 DIAGNOSIS — E669 Obesity, unspecified: Secondary | ICD-10-CM | POA: Diagnosis not present

## 2015-05-26 NOTE — Progress Notes (Signed)
Name: Richard Mueller   MRN: [id number]    DOB: 12/27/1955   Date:05/26/2015       Progress Note  Subjective  Chief Complaint  Chief Complaint  Patient presents with  . Annual Exam    HPI  Male exam: he has been under stress lately. Work has been more stressful, had cancer removed from his hand, grand-daughters and daughter moved back in with them. During the Summer, he was working outdoors in the heat, and felt weak and shaky, his skin was hot. Wife got fans on him and more fluids and he felt better. Did not go to [facility] - advised to call 911. Explained it could have been hypoglycemia, heat stroke or MI.  He did not have chest pain, but felt short of breath. He felt tired the following day  Hyperlipidemia: taking Atorvastatin, labs done this am and results are pending. He denies myalgia  HTN: taking Losartan, bp was at goal when last seen, but bp is elevated today  Plantar Fascitis: left foot, wearing insoles, but did not get sandals yet, still has severe pain on left foot when he first gets up  GERD: stopped Omeprazole, taking Zantac qhs and is doing well, no episodes of chest pain since.       Patient Active Problem List   Diagnosis Date Noted  . History of squamous cell carcinoma of skin 05/26/2015  . GERD without esophagitis 05/26/2015  . Family history of cardiac disorder 03/17/2015  . Gastro-esophageal reflux disease without esophagitis 03/17/2015  . Obesity (BMI 30-39.9) 03/17/2015  . Osteoarthrosis 03/17/2015  . Perennial allergic rhinitis with seasonal variation 03/17/2015  . Hyperlipidemia   . Hypertension   . Dysmetabolic syndrome 13/24/4010    Past Surgical History  Procedure Laterality Date  . Orthopedic surgery    . Colonoscopy    . Skin cancer destruction Left 03/06/2015    Hand- Dr. Phillip Heal    Family History  Problem Relation Age of Onset  . Heart disease Mother   . Heart attack Mother   . COPD Mother   . Heart disease Father   . Hypertension Father    . COPD Father   . Cancer Father      Prostate and Skin  . COPD Sister   . Depression Daughter     1  . Heart attack Brother 54    2014    Social History   Social History  . Marital Status: Married    Spouse Name: N/A  . Number of Children: N/A  . Years of Education: N/A   Occupational History  . Not on file.   Social History Main Topics  . Smoking status: Former Smoker -- 1.00 packs/day for 15 years    Types: Cigarettes    Start date: 08/24/1987    Quit date: 08/26/2002  . Smokeless tobacco: Never Used  . Alcohol Use: 0.0 oz/week    0 Standard drinks or equivalent per week     Comment: ocassional  . Drug Use: No  . Sexual Activity:    Partners: Female   Other Topics Concern  . Not on file   Social History Narrative     Current outpatient prescriptions:  .  aspirin 81 MG tablet, Take 81 mg by mouth daily., Disp: , Rfl:  .  atorvastatin (LIPITOR) 20 MG tablet, Take 1 tablet (20 mg total) by mouth daily., Disp: 30 tablet, Rfl: 11 .  fexofenadine-pseudoephedrine (ALLEGRA-D 12 HOUR) 60-120 MG per tablet, Take 1 tablet by mouth  every 12 (twelve) hours., Disp: , Rfl:  .  fish oil-omega-3 fatty acids 1000 MG capsule, Take 1 g by mouth daily. , Disp: , Rfl:  .  glucosamine-chondroitin 500-400 MG tablet, Take 1 tablet by mouth daily. , Disp: , Rfl:  .  losartan (COZAAR) 50 MG tablet, Take 1 tablet (50 mg total) by mouth daily., Disp: 30 tablet, Rfl: 11 .  Multiple Vitamin (MULTIVITAMIN) tablet, Take 1 tablet by mouth daily., Disp: , Rfl:  .  ranitidine (ZANTAC) 150 MG tablet, Take 150 mg by mouth at bedtime., Disp: , Rfl:   Allergies  Allergen Reactions  . Ace Inhibitors     rash  . Lisinopril     Rash, pins and needles in arms     ROS  Constitutional: Negative for fever or weight change.  Respiratory: Negative for cough and shortness of breath.   Cardiovascular: Negative for chest pain or palpitations.  Gastrointestinal: Negative for abdominal pain, no bowel  changes.  Musculoskeletal: Negative for gait problem or joint swelling.  Skin: Negative for rash.  Neurological: Negative for dizziness or headache.  No other specific complaints in a complete review of systems (except as listed in HPI above).  Objective  Filed Vitals:   05/26/15 1142  BP: 142/88  Pulse: 87  Temp: 98.6 F (37 C)  TempSrc: Oral  Resp: 14  Height: 5\' 10"  (1.778 m)  Weight: 224 lb 11.2 oz (101.923 kg)  SpO2: 97%    Body mass index is 32.24 kg/(m^2).  Physical Exam   Constitutional: Patient appears well-developed and well-nourished. No distress.  HENT: Head: Normocephalic and atraumatic. Ears: B TMs ok, no erythema or effusion; Nose: Nose normal. Mouth/Throat: Oropharynx is clear and moist. No oropharyngeal exudate.  Eyes: Conjunctivae and EOM are normal. Pupils are equal, round, and reactive to light. No scleral icterus.  Neck: Normal range of motion. Neck supple. No JVD present. No thyromegaly present.  Cardiovascular: Normal rate, regular rhythm and normal heart sounds.  No murmur heard. No BLE edema. Pulmonary/Chest: Effort normal and breath sounds normal. No respiratory distress. Abdominal: Soft. Bowel sounds are normal, no distension. There is no tenderness. no masses MALE GENITALIA: Normal descended testes bilaterally, no masses palpated, no hernias, no lesions, no discharge RECTAL: Prostate normal size and consistency, no rectal masses or hemorrhoids Musculoskeletal: Normal range of motion, no joint effusions. No gross deformities Neurological: he is alert and oriented to person, place, and time. No cranial nerve deficit. Coordination, balance, strength, speech and gait are normal.  Skin: Skin is warm and dry. No rash noted. No erythema.  Psychiatric: Patient has a normal mood and affect. behavior is normal. Judgment and thought content normal.  PHQ2/9: Depression screen PHQ 2/9 05/26/2015  Decreased Interest 0  Down, Depressed, Hopeless 0  PHQ - 2  Score 0   Fall Risk: Fall Risk  05/26/2015  Falls in the past year? No    Functional Status Survey: Is the patient deaf or have difficulty hearing?: Yes (bilateral hearing slightly lose) Does the patient have difficulty seeing, even when wearing glasses/contacts?: Yes (glasses) Does the patient have difficulty concentrating, remembering, or making decisions?: No Does the patient have difficulty walking or climbing stairs?: No Does the patient have difficulty dressing or bathing?: No Does the patient have difficulty doing errands alone such as visiting a doctor's office or shopping?: No    Assessment & Plan  1. Encounter for routine history and physical exam for male  Discussed current USPTF guidelines during CPE -  he had PSA done already, colonoscopy up to date, immunizations up to date  Discussed importance of 150 minutes of physical activity weekly, eat two servings of fish weekly, eat one serving of tree nuts ( cashews, pistachios, pecans, almonds.Marland Kitchen) every other day, eat 6 servings of fruit/vegetables daily and drink plenty of water and avoid sweet beverages.   2. Obesity (BMI 30-39.9)  Discussed all optiosns for weight loss medications including Belviq, Qsymia, Saxenda and Contrave. Discussed risk and benefits of each of them. Discussed with the patient the risk posed by an increased BMI. Discussed importance of portion control, calorie counting and at least 150 minutes of physical activity weekly. Avoid sweet beverages and drink more water. Eat at least 6 servings of fruit and vegetables daily   3. Stress  He states he had a bad year, but does not want medications at this time  4. Dyslipidemia  Lab results pending  5. Essential hypertension  bp elevated when he first got here but improved before he left  6. Gastro-esophageal reflux disease without esophagitis   continue prn medication   7. Needs flu shot  - Flu Vaccine QUAD 36+ mos IM

## 2015-05-27 ENCOUNTER — Encounter: Payer: Self-pay | Admitting: Family Medicine

## 2015-05-27 LAB — CBC WITH DIFFERENTIAL/PLATELET
BASOS ABS: 0 10*3/uL (ref 0.0–0.2)
Basos: 1 %
EOS (ABSOLUTE): 0.1 10*3/uL (ref 0.0–0.4)
Eos: 2 %
Hematocrit: 45.2 % (ref 37.5–51.0)
Hemoglobin: 14.8 g/dL (ref 12.6–17.7)
Immature Grans (Abs): 0 10*3/uL (ref 0.0–0.1)
Immature Granulocytes: 0 %
LYMPHS ABS: 1.6 10*3/uL (ref 0.7–3.1)
Lymphs: 33 %
MCH: 30 pg (ref 26.6–33.0)
MCHC: 32.7 g/dL (ref 31.5–35.7)
MCV: 92 fL (ref 79–97)
MONOCYTES: 11 %
MONOS ABS: 0.5 10*3/uL (ref 0.1–0.9)
Neutrophils Absolute: 2.5 10*3/uL (ref 1.4–7.0)
Neutrophils: 53 %
Platelets: 193 10*3/uL (ref 150–379)
RBC: 4.94 x10E6/uL (ref 4.14–5.80)
RDW: 13.4 % (ref 12.3–15.4)
WBC: 4.7 10*3/uL (ref 3.4–10.8)

## 2015-05-27 LAB — LIPID PANEL
CHOL/HDL RATIO: 3.4 ratio (ref 0.0–5.0)
Cholesterol, Total: 147 mg/dL (ref 100–199)
HDL: 43 mg/dL (ref >39)
LDL CALC: 81 mg/dL (ref 0–99)
TRIGLYCERIDES: 114 mg/dL (ref 0–149)
VLDL Cholesterol Cal: 23 mg/dL (ref 5–40)

## 2015-05-27 LAB — COMPREHENSIVE METABOLIC PANEL
ALBUMIN: 4.7 g/dL (ref 3.5–5.5)
ALK PHOS: 69 IU/L (ref 39–117)
ALT: 18 IU/L (ref 0–44)
AST: 20 IU/L (ref 0–40)
Albumin/Globulin Ratio: 2.2 (ref 1.1–2.5)
BUN / CREAT RATIO: 17 (ref 9–20)
BUN: 19 mg/dL (ref 6–24)
Bilirubin Total: 0.4 mg/dL (ref 0.0–1.2)
CHLORIDE: 100 mmol/L (ref 97–108)
CO2: 24 mmol/L (ref 18–29)
Calcium: 9.6 mg/dL (ref 8.7–10.2)
Creatinine, Ser: 1.15 mg/dL (ref 0.76–1.27)
GFR calc Af Amer: 80 mL/min/{1.73_m2} (ref >59)
GFR calc non Af Amer: 69 mL/min/{1.73_m2} (ref >59)
GLUCOSE: 96 mg/dL (ref 65–99)
Globulin, Total: 2.1 g/dL (ref 1.5–4.5)
Potassium: 5 mmol/L (ref 3.5–5.2)
Sodium: 139 mmol/L (ref 134–144)
Total Protein: 6.8 g/dL (ref 6.0–8.5)

## 2015-05-27 LAB — PSA: Prostate Specific Ag, Serum: 1.3 ng/mL (ref 0.0–4.0)

## 2015-05-27 LAB — TSH: TSH: 1.12 u[IU]/mL (ref 0.450–4.500)

## 2015-05-27 LAB — HEMOGLOBIN A1C
ESTIMATED AVERAGE GLUCOSE: 123 mg/dL
HEMOGLOBIN A1C: 5.9 % — AB (ref 4.8–5.6)

## 2015-05-28 NOTE — Telephone Encounter (Signed)
Pt.notified

## 2015-05-29 NOTE — Progress Notes (Signed)
Patient notified

## 2015-06-05 LAB — SPECIMEN STATUS REPORT

## 2015-10-06 ENCOUNTER — Other Ambulatory Visit: Payer: Self-pay

## 2015-10-06 DIAGNOSIS — E785 Hyperlipidemia, unspecified: Secondary | ICD-10-CM

## 2015-10-06 MED ORDER — ATORVASTATIN CALCIUM 20 MG PO TABS
20.0000 mg | ORAL_TABLET | Freq: Every day | ORAL | Status: DC
Start: 2015-10-06 — End: 2016-02-26

## 2015-10-06 MED ORDER — LOSARTAN POTASSIUM 50 MG PO TABS: 50.0000 mg | ORAL_TABLET | Freq: Every day | ORAL | Status: DC

## 2015-10-06 NOTE — Telephone Encounter (Signed)
Refill sent for Atorvastatin 

## 2015-10-06 NOTE — Telephone Encounter (Signed)
Refill sent for Losartan  

## 2015-10-08 ENCOUNTER — Encounter: Payer: Self-pay | Admitting: Family Medicine

## 2015-10-08 ENCOUNTER — Ambulatory Visit (INDEPENDENT_AMBULATORY_CARE_PROVIDER_SITE_OTHER): Payer: 59 | Admitting: Family Medicine

## 2015-10-08 ENCOUNTER — Telehealth: Payer: Self-pay | Admitting: Family Medicine

## 2015-10-08 ENCOUNTER — Ambulatory Visit
Admission: RE | Admit: 2015-10-08 | Discharge: 2015-10-08 | Disposition: A | Discharge status: Home / Self Care | Payer: 59 | Source: Ambulatory Visit | Attending: Family Medicine | Admitting: Family Medicine

## 2015-10-08 VITALS — BP 128/70 | HR 85 | Temp 98.3°F | Resp 18 | Ht 70.0 in | Wt 220.0 lb

## 2015-10-08 DIAGNOSIS — R059 Cough, unspecified: Secondary | ICD-10-CM

## 2015-10-08 DIAGNOSIS — R0781 Pleurodynia: Secondary | ICD-10-CM

## 2015-10-08 DIAGNOSIS — R05 Cough: Secondary | ICD-10-CM

## 2015-10-08 MED ORDER — AZITHROMYCIN 250 MG PO TABS: ORAL_TABLET | ORAL | Status: DC

## 2015-10-08 MED ORDER — DEXTROSE 5 % IV SOLN: 1.0000 g | INTRAVENOUS | Status: DC

## 2015-10-08 MED ORDER — HYDROCOD POLST-CPM POLST ER 10-8 MG/5ML PO SUER: 5.0000 mL | Freq: Two times a day (BID) | ORAL | Status: DC | PRN

## 2015-10-08 MED ORDER — CEFTRIAXONE SODIUM 1 G IJ SOLR
1.0000 g | Freq: Once | INTRAMUSCULAR | Status: AC
  Administered 2015-10-08: 1 g via INTRAMUSCULAR

## 2015-10-08 MED ORDER — LIDOCAINE HCL (PF) 1 % IJ SOLN
0.9000 mL | Freq: Once | INTRAMUSCULAR | Status: AC
  Administered 2015-10-08: 0.9 mL

## 2015-10-08 NOTE — Progress Notes (Signed)
Name: Richard Mueller   MRN: [id number]    DOB: [birth year]   Date:10/08/2015       Progress Note  Subjective  Chief Complaint  Chief Complaint  Patient presents with  . URI    Onset-yesterday, dry and hacking cough, describes feeling his esophagus having a burning sensation, fever of 101 at home yesterday and symptoms are worsen. Patient has tried Tyelnol and Delysm DM and cough drops with no relief. Patient states when standing in the shower this morning as the water hit his back described it as needles hitting him. Also very fatigue and came home from work early yesterday.     HPI  Cough and Pleuritic Chest pain: he had cold symptoms for the past week and over the past 24 hours he developed severe fatigue, pleuritic chest pain, a dry cough, mild SOB and high fever. He has a history of pneumonia in the past. He used to smoke but quit in 2004. Talking otc medication without much improvement of symptoms. No sick contacts. Mild change in appetite   Patient Active Problem List   Diagnosis Date Noted  . History of squamous cell carcinoma of skin 05/26/2015  . GERD without esophagitis 05/26/2015  . Family history of cardiac disorder 03/17/2015  . Gastro-esophageal reflux disease without esophagitis 03/17/2015  . Obesity (BMI 30-39.9) 03/17/2015  . Osteoarthrosis 03/17/2015  . Perennial allergic rhinitis with seasonal variation 03/17/2015  . Hyperlipidemia   . Hypertension   . Dysmetabolic syndrome [id number]    Past Surgical History  Procedure Laterality Date  . Orthopedic surgery    . Colonoscopy    . Skin cancer destruction Left 03/06/2015    Hand- Dr. Phillip Heal    Family History  Problem Relation Age of Onset  . Heart disease Mother   . Heart attack Mother   . COPD Mother   . Heart disease Father   . Hypertension Father   . COPD Father   . Cancer Father      Prostate and Skin  . COPD Sister   . Depression Daughter     1  . Heart attack Brother 54    2014    Social  History   Social History  . Marital Status: Married    Spouse Name: N/A  . Number of Children: N/A  . Years of Education: N/A   Occupational History  . Not on file.   Social History Main Topics  . Smoking status: Former Smoker -- 1.00 packs/day for 15 years    Types: Cigarettes    Start date: 08/24/1987    Quit date: 08/26/2002  . Smokeless tobacco: Never Used  . Alcohol Use: 0.0 oz/week    0 Standard drinks or equivalent per week     Comment: ocassional  . Drug Use: No  . Sexual Activity:    Partners: Female   Other Topics Concern  . Not on file   Social History Narrative     Current outpatient prescriptions:  .  aspirin 81 MG tablet, Take 81 mg by mouth daily., Disp: , Rfl:  .  atorvastatin (LIPITOR) 20 MG tablet, Take 1 tablet (20 mg total) by mouth daily., Disp: 90 tablet, Rfl: 3 .  fish oil-omega-3 fatty acids 1000 MG capsule, Take 1 g by mouth daily. , Disp: , Rfl:  .  glucosamine-chondroitin 500-400 MG tablet, Take 1 tablet by mouth daily. , Disp: , Rfl:  .  losartan (COZAAR) 50 MG tablet, Take 1 tablet (50 mg total) by mouth  daily., Disp: 90 tablet, Rfl: 3 .  Multiple Vitamin (MULTIVITAMIN) tablet, Take 1 tablet by mouth daily., Disp: , Rfl:  .  ranitidine (ZANTAC) 150 MG tablet, Take 150 mg by mouth at bedtime., Disp: , Rfl:  .  azithromycin (ZITHROMAX) 250 MG tablet, Take 2 first day and one daily after that, Disp: 6 tablet, Rfl: 0 .  chlorpheniramine-HYDROcodone (TUSSIONEX PENNKINETIC ER) 10-8 MG/5ML SUER, Take 5 mLs by mouth every 12 (twelve) hours as needed., Disp: 140 mL, Rfl: 0 .  fexofenadine-pseudoephedrine (ALLEGRA-D 12 HOUR) 60-120 MG per tablet, Take 1 tablet by mouth every 12 (twelve) hours. Reported on 10/08/2015, Disp: , Rfl:   Current facility-administered medications:  .  cefTRIAXone (ROCEPHIN) 1 g in dextrose 5 % 50 mL IVPB, 1 g, Intravenous, Q24H, Steele Sizer, MD  Allergies  Allergen Reactions  . Ace Inhibitors     rash  . Lisinopril      Rash, pins and needles in arms     ROS  Ten systems reviewed and is negative except as mentioned in HPI   Objective  Filed Vitals:   10/08/15 0959  BP: 128/70  Pulse: 85  Temp: 98.3 F (36.8 C)  TempSrc: Oral  Resp: 18  Height: 5\' 10"  (1.778 m)  Weight: 220 lb (99.791 kg)  SpO2: 97%    Body mass index is 31.57 kg/(m^2).  Physical Exam  Constitutional: Patient appears well-developed and well-nourished. Obese.  No distress.  HEENT: head atraumatic, normocephalic, pupils equal and reactive to light, ears TM,  neck supple, throat within normal limits Cardiovascular: Normal rate, regular rhythm and normal heart sounds.  No murmur heard. No BLE edema. Pulmonary/Chest: Effort normal and breath sounds normal. No respiratory distress. Abdominal: Soft.  There is no tenderness. Psychiatric: Patient has a normal mood and affect. behavior is normal. Judgment and thought content normal.  PHQ2/9: Depression screen [facility] 2/9 10/08/2015 05/26/2015  Decreased Interest 0 0  Down, Depressed, Hopeless 0 0  PHQ - 2 Score 0 0    Fall Risk: Fall Risk  10/08/2015 05/26/2015  Falls in the past year? No No     Functional Status Survey: Is the patient deaf or have difficulty hearing?: No Does the patient have difficulty seeing, even when wearing glasses/contacts?: No Does the patient have difficulty concentrating, remembering, or making decisions?: No Does the patient have difficulty walking or climbing stairs?: No Does the patient have difficulty dressing or bathing?: No Does the patient have difficulty doing errands alone such as visiting a doctor's office or shopping?: No    Assessment & Plan  1. Cough  Likely CAP - atypical, we will treat  - cefTRIAXone (ROCEPHIN) 1 g in dextrose 5 % 50 mL IVPB; Inject 1 g into the vein daily. - azithromycin (ZITHROMAX) 250 MG tablet; Take 2 first day and one daily after that  Dispense: 6 tablet; Refill: 0 - CBC with Differential/Platelet - Basic  metabolic panel; Future - DG Chest 2 View; Future - chlorpheniramine-HYDROcodone (TUSSIONEX PENNKINETIC ER) 10-8 MG/5ML SUER; Take 5 mLs by mouth every 12 (twelve) hours as needed.  Dispense: 140 mL; Refill: 0  2. Pleuritic chest pain  - cefTRIAXone (ROCEPHIN) 1 g in dextrose 5 % 50 mL IVPB; Inject 1 g into the vein daily. - azithromycin (ZITHROMAX) 250 MG tablet; Take 2 first day and one daily after that  Dispense: 6 tablet; Refill: 0 - CBC with Differential/Platelet - Basic metabolic panel; Future - DG Chest 2 View; Future

## 2015-10-08 NOTE — Telephone Encounter (Signed)
Z-Pack prescription was sent to wrong pharmacy. Please send to [facility] in [place]

## 2015-10-09 ENCOUNTER — Telehealth: Payer: Self-pay | Admitting: Family Medicine

## 2015-10-09 LAB — CBC WITH DIFFERENTIAL/PLATELET
BASOS ABS: 0 10*3/uL (ref 0.0–0.2)
Basos: 1 %
EOS (ABSOLUTE): 0.1 10*3/uL (ref 0.0–0.4)
EOS: 2 %
HEMOGLOBIN: 14.6 g/dL (ref 12.6–17.7)
Hematocrit: 42.6 % (ref 37.5–51.0)
IMMATURE GRANULOCYTES: 0 %
Immature Grans (Abs): 0 10*3/uL (ref 0.0–0.1)
LYMPHS ABS: 1.6 10*3/uL (ref 0.7–3.1)
Lymphs: 27 %
MCH: 30.6 pg (ref 26.6–33.0)
MCHC: 34.3 g/dL (ref 31.5–35.7)
MCV: 89 fL (ref 79–97)
MONOCYTES: 23 %
Monocytes Absolute: 1.4 10*3/uL — ABNORMAL HIGH (ref 0.1–0.9)
NEUTROS PCT: 47 %
Neutrophils Absolute: 2.8 10*3/uL (ref 1.4–7.0)
Platelets: 184 10*3/uL (ref 150–379)
RBC: 4.77 x10E6/uL (ref 4.14–5.80)
RDW: 13.4 % (ref 12.3–15.4)
WBC: 5.9 10*3/uL (ref 3.4–10.8)

## 2015-10-09 MED ORDER — VALACYCLOVIR HCL 1 G PO TABS: 1000.0000 mg | ORAL_TABLET | Freq: Three times a day (TID) | ORAL | Status: DC

## 2015-10-09 NOTE — Telephone Encounter (Signed)
Likely shingles. I will start him on valtrex now

## 2015-10-09 NOTE — Telephone Encounter (Signed)
Patient's wife was informed of the new rx that was sent in and was encourage to keep his appt tomorrow.

## 2015-10-09 NOTE — Telephone Encounter (Signed)
Patient was seen on 10-08-15. Had mentioned to Dr Ancil Boozer about him feeling like pens and needles sticking him in his back on chest. When he woke up this morning he had developed a rash on chest and back. Patient has follow up appointment for this Friday but would like to know if he need to be seen today to get the prescription or if he should just wait until Friday or if you are able to prescribe something without him coming in

## 2015-10-10 ENCOUNTER — Encounter: Payer: Self-pay | Admitting: Family Medicine

## 2015-10-10 ENCOUNTER — Ambulatory Visit (INDEPENDENT_AMBULATORY_CARE_PROVIDER_SITE_OTHER): Payer: 59 | Admitting: Family Medicine

## 2015-10-10 VITALS — BP 128/70 | HR 71 | Temp 98.0°F | Resp 16 | Ht 70.0 in | Wt 223.1 lb

## 2015-10-10 DIAGNOSIS — R21 Rash and other nonspecific skin eruption: Secondary | ICD-10-CM

## 2015-10-10 DIAGNOSIS — R062 Wheezing: Secondary | ICD-10-CM | POA: Diagnosis not present

## 2015-10-10 DIAGNOSIS — R059 Cough, unspecified: Secondary | ICD-10-CM

## 2015-10-10 DIAGNOSIS — R05 Cough: Secondary | ICD-10-CM | POA: Diagnosis not present

## 2015-10-10 DIAGNOSIS — R202 Paresthesia of skin: Secondary | ICD-10-CM | POA: Diagnosis not present

## 2015-10-10 DIAGNOSIS — J40 Bronchitis, not specified as acute or chronic: Secondary | ICD-10-CM | POA: Diagnosis not present

## 2015-10-10 MED ORDER — FLUTICASONE FUROATE-VILANTEROL 100-25 MCG/INH IN AEPB: 1.0000 | INHALATION_SPRAY | Freq: Every day | RESPIRATORY_TRACT | Status: DC

## 2015-10-10 NOTE — Progress Notes (Signed)
Name: Richard Mueller   MRN: [id number]    DOB: [birth year]   Date:10/10/2015       Progress Note  Subjective  Chief Complaint  Chief Complaint  Patient presents with  . Cough    2 day F/U, Patient states cough medications has helped his cough and sleeps. Patient states now he is wheezing.     HPI  Cough and Pleuritic Chest pain: he had cold symptoms for the past week and over the past 36 hours he developed severe fatigue, pleuritic chest pain, a dry cough, mild SOB and high fever, he also has noticed tingling sensation on upper back and anterior chest  - he called yesterday because of a rash and we thought it was shingles and called in Valtrex.  He has a history of pneumonia in the past. He used to smoke but quit in 2004. Talking otc medication without much improvement of symptoms. No sick contacts. CXR and CBC were normal, appetite has improved.   Patient Active Problem List   Diagnosis Date Noted  . History of squamous cell carcinoma of skin 05/26/2015  . GERD without esophagitis 05/26/2015  . Family history of cardiac disorder 03/17/2015  . Gastro-esophageal reflux disease without esophagitis 03/17/2015  . Obesity (BMI 30-39.9) 03/17/2015  . Osteoarthrosis 03/17/2015  . Perennial allergic rhinitis with seasonal variation 03/17/2015  . Hyperlipidemia   . Hypertension   . Dysmetabolic syndrome [id number]    Past Surgical History  Procedure Laterality Date  . Orthopedic surgery    . Colonoscopy    . Skin cancer destruction Left 03/06/2015    Hand- Dr. Phillip Heal    Family History  Problem Relation Age of Onset  . Heart disease Mother   . Heart attack Mother   . COPD Mother   . Heart disease Father   . Hypertension Father   . COPD Father   . Cancer Father      Prostate and Skin  . COPD Sister   . Depression Daughter     1  . Heart attack Brother 54    2014    Social History   Social History  . Marital Status: Married    Spouse Name: N/A  . Number of Children: N/A   . Years of Education: N/A   Occupational History  . Not on file.   Social History Main Topics  . Smoking status: Former Smoker -- 1.00 packs/day for 15 years    Types: Cigarettes    Start date: 08/24/1987    Quit date: 08/26/2002  . Smokeless tobacco: Never Used  . Alcohol Use: 0.0 oz/week    0 Standard drinks or equivalent per week     Comment: ocassional  . Drug Use: No  . Sexual Activity:    Partners: Female   Other Topics Concern  . Not on file   Social History Narrative     Current outpatient prescriptions:  .  aspirin 81 MG tablet, Take 81 mg by mouth daily., Disp: , Rfl:  .  atorvastatin (LIPITOR) 20 MG tablet, Take 1 tablet (20 mg total) by mouth daily., Disp: 90 tablet, Rfl: 3 .  azithromycin (ZITHROMAX) 250 MG tablet, Take 2 first day and one daily after that, Disp: 6 tablet, Rfl: 0 .  chlorpheniramine-HYDROcodone (TUSSIONEX PENNKINETIC ER) 10-8 MG/5ML SUER, Take 5 mLs by mouth every 12 (twelve) hours as needed., Disp: 140 mL, Rfl: 0 .  fexofenadine-pseudoephedrine (ALLEGRA-D 12 HOUR) 60-120 MG per tablet, Take 1 tablet by mouth every 12 (twelve)  hours. Reported on 10/08/2015, Disp: , Rfl:  .  fish oil-omega-3 fatty acids 1000 MG capsule, Take 1 g by mouth daily. , Disp: , Rfl:  .  glucosamine-chondroitin 500-400 MG tablet, Take 1 tablet by mouth daily. , Disp: , Rfl:  .  losartan (COZAAR) 50 MG tablet, Take 1 tablet (50 mg total) by mouth daily., Disp: 90 tablet, Rfl: 3 .  Multiple Vitamin (MULTIVITAMIN) tablet, Take 1 tablet by mouth daily., Disp: , Rfl:  .  ranitidine (ZANTAC) 150 MG tablet, Take 150 mg by mouth at bedtime., Disp: , Rfl:  .  valACYclovir (VALTREX) 1000 MG tablet, Take 1 tablet (1,000 mg total) by mouth 3 (three) times daily., Disp: 30 tablet, Rfl: 0  Allergies  Allergen Reactions  . Ace Inhibitors     rash  . Lisinopril     Rash, pins and needles in arms     ROS  Ten systems reviewed and is negative except as mentioned in HPI    Objective  Filed Vitals:   10/10/15 1136  BP: 128/70  Pulse: 71  Temp: 98 F (36.7 C)  TempSrc: Oral  Resp: 16  Height: 5\' 10"  (1.778 m)  Weight: 223 lb 1.6 oz (101.197 kg)  SpO2: 95%    Body mass index is 32.01 kg/(m^2).  Physical Exam  Constitutional: Patient appears well-developed and well-nourished. Obese  No distress.  HEENT: head atraumatic, normocephalic, pupils equal and reactive to light,  neck supple, throat within normal limits Cardiovascular: Normal rate, regular rhythm and normal heart sounds.  No murmur heard. No BLE edema. Pulmonary/Chest: Effort normal and breath sounds normal. No respiratory distress. Abdominal: Soft.  There is no tenderness. Psychiatric: Patient has a normal mood and affect. behavior is normal. Judgment and thought content normal. Rash: erythematous small papules on anterior chest, under breast, likely candida. Rash is not like shingles, may stop Valtrex  Recent Results (from the past 2160 hour(s))  CBC with Differential/Platelet     Status: Abnormal   Collection Time: 10/08/15 11:14 AM  Result Value Ref Range   WBC 5.9 3.4 - 10.8 x10E3/uL   RBC 4.77 4.14 - 5.80 x10E6/uL   Hemoglobin 14.6 12.6 - 17.7 g/dL   Hematocrit 42.6 37.5 - 51.0 %   MCV 89 79 - 97 fL   MCH 30.6 26.6 - 33.0 pg   MCHC 34.3 31.5 - 35.7 g/dL   RDW 13.4 12.3 - 15.4 %   Platelets 184 150 - 379 x10E3/uL   Neutrophils 47 %   Lymphs 27 %   Monocytes 23 %   Eos 2 %   Basos 1 %   Neutrophils Absolute 2.8 1.4 - 7.0 x10E3/uL   Lymphocytes Absolute 1.6 0.7 - 3.1 x10E3/uL   Monocytes Absolute 1.4 (H) 0.1 - 0.9 x10E3/uL   EOS (ABSOLUTE) 0.1 0.0 - 0.4 x10E3/uL   Basophils Absolute 0.0 0.0 - 0.2 x10E3/uL   Immature Granulocytes 0 %   Immature Grans (Abs) 0.0 0.0 - 0.1 x10E3/uL   Hematology Comments: Note:     Comment: Verified by microscopic examination.     PHQ2/9: Depression screen [facility] 2/9 10/08/2015 05/26/2015  Decreased Interest 0 0  Down, Depressed, Hopeless 0  0  PHQ - 2 Score 0 0     Fall Risk: Fall Risk  10/08/2015 05/26/2015  Falls in the past year? No No    Assessment & Plan  1. Cough  Continue medication   2. Tingling of skin  Unknown etiology , may be secondary  to virus , call back if no resolution for referral to neurologist.   3. Wheezing  - fluticasone furoate-vilanterol (BREO ELLIPTA) 100-25 MCG/INH AEPB; Inhale 1 puff into the lungs daily.  Dispense: 60 each; Refill: 0  4. Bronchitis  - fluticasone furoate-vilanterol (BREO ELLIPTA) 100-25 MCG/INH AEPB; Inhale 1 puff into the lungs daily.  Dispense: 60 each; Refill: 0  5. Rash  Likely yeast, he will try otc Clotrimazole otc

## 2015-11-28 ENCOUNTER — Encounter: Payer: Self-pay | Admitting: Cardiovascular Disease

## 2015-11-28 ENCOUNTER — Ambulatory Visit (INDEPENDENT_AMBULATORY_CARE_PROVIDER_SITE_OTHER): Payer: 59 | Admitting: Cardiovascular Disease

## 2015-11-28 VITALS — BP 124/68 | HR 72 | Ht 71.0 in | Wt 223.8 lb

## 2015-11-28 DIAGNOSIS — I10 Essential (primary) hypertension: Secondary | ICD-10-CM | POA: Diagnosis not present

## 2015-11-28 NOTE — Progress Notes (Signed)
Cardiology Office Note   Date:  11/28/2015   ID:  Richard Mueller, DOB 05/08/1956, MRN [id number]  PCP:  Loistine Chance, MD  Cardiologist:   Kathlyn Sacramento, MD   Chief Complaint  Patient presents with  . other    "doing well." Meds reviewed by the patient verbally.       History of Present Illness: Richard Mueller is a 60 y.o. male who presents for a followup visit regarding hypertension and hyperlipidemia. He has no previous cardiac history. He does have a family history of CAD. He was evaluated in 2014 for atypical chest pain. He underwent a treadmill nuclear stress test which showed no evidence of ischemia with normal ejection fraction.  He has been doing very well and denies any chest pain or shortness of breath.  He reports no side effects with medications. He stays busy at work but does not exercise on a regular basis.  Past Medical History  Diagnosis Date  . Obesity, unspecified   . Esophageal reflux   . Allergic rhinitis, cause unspecified   . Osteoarthrosis, unspecified whether generalized or localized, unspecified site   . GERD (gastroesophageal reflux disease)   . Hypertension   . Hyperlipidemia   . Metabolic syndrome   . Bronchitis   . Osteoarthritis   . Allergy   . Right upper quadrant pain     Past Surgical History  Procedure Laterality Date  . Orthopedic surgery    . Colonoscopy    . Skin cancer destruction Left 03/06/2015    Hand- Dr. Phillip Heal     Current Outpatient Prescriptions  Medication Sig Dispense Refill  . aspirin 81 MG tablet Take 81 mg by mouth daily.    Marland Kitchen atorvastatin (LIPITOR) 20 MG tablet Take 1 tablet (20 mg total) by mouth daily. 90 tablet 3  . fexofenadine-pseudoephedrine (ALLEGRA-D 12 HOUR) 60-120 MG per tablet Take 1 tablet by mouth every 12 (twelve) hours. Reported on 10/08/2015    . fish oil-omega-3 fatty acids 1000 MG capsule Take 1 g by mouth daily.     Marland Kitchen glucosamine-chondroitin 500-400 MG tablet Take 1 tablet by mouth daily.      Marland Kitchen losartan (COZAAR) 50 MG tablet Take 1 tablet (50 mg total) by mouth daily. 90 tablet 3  . Multiple Vitamin (MULTIVITAMIN) tablet Take 1 tablet by mouth daily.    . ranitidine (ZANTAC) 150 MG tablet Take 150 mg by mouth at bedtime.     No current facility-administered medications for this visit.    Allergies:   Ace inhibitors and Lisinopril    Social History:  The patient  reports that he quit smoking about 13 years ago. His smoking use included Cigarettes. He started smoking about 28 years ago. He has a 15 pack-year smoking history. He has never used smokeless tobacco. He reports that he drinks alcohol. He reports that he does not use illicit drugs.   Family History:  The patient's family history includes COPD in his father, mother, and sister; Cancer in his father; Depression in his daughter; Heart attack in his mother; Heart attack (age of onset: 87) in his brother; Heart disease in his father and mother; Hypertension in his father.    ROS:  Please see the history of present illness.   Otherwise, review of systems are positive for none.   All other systems are reviewed and negative.    PHYSICAL EXAM: VS:  BP 124/68 mmHg  Pulse 72  Ht 5\' 11"  (1.803 m)  Wt 223  lb 12 oz (101.492 kg)  BMI 31.22 kg/m2  SpO2 97% , BMI Body mass index is 31.22 kg/(m^2). GEN: Well nourished, well developed, in no acute distress HEENT: normal Neck: no JVD, carotid bruits, or masses Cardiac: RRR; no murmurs, rubs, or gallops,no edema  Respiratory:  clear to auscultation bilaterally, normal work of breathing GI: soft, nontender, nondistended, + BS MS: no deformity or atrophy Skin: warm and dry, no rash Neuro:  Strength and sensation are intact Psych: euthymic mood, full affect   EKG:  EKG is ordered today. The ekg ordered today demonstrates normal sinus rhythm with nonspecific T wave changes with motion artifact.   Recent Labs: 05/26/2015: ALT 18; BUN 19; Creatinine, Ser 1.15; Potassium 5.0;  Sodium 139; TSH 1.120 10/08/2015: Platelets 184    Lipid Panel    Component Value Date/Time   CHOL 147 05/26/2015 0816   CHOL 140 02/27/2013 1609   TRIG 114 05/26/2015 0816   TRIG 108 02/27/2013 1609   HDL 43 05/26/2015 0816   HDL 42 02/27/2013 1609   CHOLHDL 3.4 05/26/2015 0816   LDLCALC 81 05/26/2015 0816   LDLCALC 76 02/27/2013 1609      Wt Readings from Last 3 Encounters:  11/28/15 223 lb 12 oz (101.492 kg)  10/10/15 223 lb 1.6 oz (101.197 kg)  10/08/15 220 lb (99.791 kg)      Other studies Reviewed: Additional studies/ records that were reviewed today include: Recent labs. Review of the above records demonstrates: Hemoglobin A1c was 5.9   ASSESSMENT AND PLAN:  1.  Hyperlipidemia: Continue treatment with atorvastatin. Most recent LDL was 81. I discussed with him the importance of starting on an exercise program.  2. Essential hypertension: Blood pressure is well controlled on current medications.  3. Family history of coronary artery disease: The patient currently has no anginal symptoms. EKG today is with motion artifact but no significant ischemia. Continue to monitor.    Disposition:   FU with me in 1 year  Signed,  Kathlyn Sacramento, MD  11/28/2015 2:03 PM    [place]

## 2015-11-28 NOTE — Patient Instructions (Signed)
Medication Instructions: Continue same medications.   Labwork: None.   Procedures/Testing: None.   Follow-Up: 1 year with Dr. Deshanda Molitor.   Any Additional Special Instructions Will Be Listed Below (If Applicable).     If you need a refill on your cardiac medications before your next appointment, please call your pharmacy.   

## 2015-12-16 ENCOUNTER — Ambulatory Visit (INDEPENDENT_AMBULATORY_CARE_PROVIDER_SITE_OTHER): Payer: 59

## 2015-12-16 DIAGNOSIS — Z23 Encounter for immunization: Secondary | ICD-10-CM

## 2016-02-04 ENCOUNTER — Emergency Department
Admission: EM | Admit: 2016-02-04 | Discharge: 2016-02-04 | Disposition: A | Discharge status: Home / Self Care | Payer: 59 | Attending: Emergency Medicine | Admitting: Emergency Medicine

## 2016-02-04 ENCOUNTER — Encounter: Payer: Self-pay | Admitting: Emergency Medicine

## 2016-02-04 ENCOUNTER — Telehealth: Payer: Self-pay

## 2016-02-04 ENCOUNTER — Emergency Department: Payer: 59

## 2016-02-04 DIAGNOSIS — Z7982 Long term (current) use of aspirin: Secondary | ICD-10-CM | POA: Diagnosis not present

## 2016-02-04 DIAGNOSIS — Z79899 Other long term (current) drug therapy: Secondary | ICD-10-CM | POA: Diagnosis not present

## 2016-02-04 DIAGNOSIS — R609 Edema, unspecified: Secondary | ICD-10-CM

## 2016-02-04 DIAGNOSIS — Z86711 Personal history of pulmonary embolism: Secondary | ICD-10-CM | POA: Diagnosis not present

## 2016-02-04 DIAGNOSIS — I10 Essential (primary) hypertension: Secondary | ICD-10-CM | POA: Diagnosis not present

## 2016-02-04 DIAGNOSIS — R600 Localized edema: Secondary | ICD-10-CM | POA: Insufficient documentation

## 2016-02-04 DIAGNOSIS — Z8679 Personal history of other diseases of the circulatory system: Secondary | ICD-10-CM | POA: Diagnosis not present

## 2016-02-04 DIAGNOSIS — E785 Hyperlipidemia, unspecified: Secondary | ICD-10-CM | POA: Insufficient documentation

## 2016-02-04 DIAGNOSIS — Z87891 Personal history of nicotine dependence: Secondary | ICD-10-CM | POA: Diagnosis not present

## 2016-02-04 DIAGNOSIS — R2241 Localized swelling, mass and lump, right lower limb: Secondary | ICD-10-CM | POA: Diagnosis present

## 2016-02-04 DIAGNOSIS — M1990 Unspecified osteoarthritis, unspecified site: Secondary | ICD-10-CM | POA: Insufficient documentation

## 2016-02-04 LAB — CBC WITH DIFFERENTIAL/PLATELET
BASOS ABS: 0 10*3/uL (ref 0–0.1)
Basophils Relative: 1 %
EOS PCT: 3 %
Eosinophils Absolute: 0.1 10*3/uL (ref 0–0.7)
HCT: 41.2 % (ref 40.0–52.0)
HEMOGLOBIN: 13.9 g/dL (ref 13.0–18.0)
Lymphocytes Relative: 30 %
Lymphs Abs: 1.6 10*3/uL (ref 1.0–3.6)
MCH: 30.3 pg (ref 26.0–34.0)
MCHC: 33.7 g/dL (ref 32.0–36.0)
MCV: 89.7 fL (ref 80.0–100.0)
Monocytes Absolute: 0.6 10*3/uL (ref 0.2–1.0)
Monocytes Relative: 11 %
NEUTROS PCT: 55 %
Neutro Abs: 3 10*3/uL (ref 1.4–6.5)
PLATELETS: 178 10*3/uL (ref 150–440)
RBC: 4.6 MIL/uL (ref 4.40–5.90)
RDW: 13.8 % (ref 11.5–14.5)
WBC: 5.4 10*3/uL (ref 3.8–10.6)

## 2016-02-04 LAB — BASIC METABOLIC PANEL
ANION GAP: 8 (ref 5–15)
BUN: 16 mg/dL (ref 6–20)
CALCIUM: 9 mg/dL (ref 8.9–10.3)
CO2: 25 mmol/L (ref 22–32)
CREATININE: 1.03 mg/dL (ref 0.61–1.24)
Chloride: 106 mmol/L (ref 101–111)
GFR calc non Af Amer: >60 mL/min (ref >60)
Glucose, Bld: 101 mg/dL — ABNORMAL HIGH (ref 65–99)
Potassium: 4.2 mmol/L (ref 3.5–5.1)
SODIUM: 139 mmol/L (ref 135–145)

## 2016-02-04 LAB — BRAIN NATRIURETIC PEPTIDE: B Natriuretic Peptide: 16 pg/mL (ref 0.0–100.0)

## 2016-02-04 MED ORDER — COLCHICINE 0.6 MG PO TABS: 1.2000 mg | ORAL_TABLET | Freq: Once | ORAL | Status: DC

## 2016-02-04 MED ORDER — PREDNISONE 20 MG PO TABS: 60.0000 mg | ORAL_TABLET | Freq: Once | ORAL | Status: DC

## 2016-02-04 MED ORDER — FUROSEMIDE 20 MG PO TABS
20.0000 mg | ORAL_TABLET | Freq: Every day | ORAL | Status: DC
Start: 2016-02-04 — End: 2016-02-09

## 2016-02-04 MED ORDER — CLINDAMYCIN HCL 150 MG PO CAPS: 300.0000 mg | ORAL_CAPSULE | Freq: Once | ORAL | Status: DC

## 2016-02-04 NOTE — Telephone Encounter (Signed)
This patient's wife called stating they just got back from a very long trip and his leg was very painful. When asked about his sx she stated that his lower leg was red, warm to the touch and swollen from the knee down to his foot. I strongly encouraged her to go to the nearest ER to r/o DVT based upon his sx and then i said I would inform Dr. Ancil Boozer.  She said ok and thanks.

## 2016-02-04 NOTE — Telephone Encounter (Signed)
Please review ED notes.  I tried to contact this patient to see how she was doing but there was no answer. A message was left for for him to give [place] a call when he got the chance.

## 2016-02-04 NOTE — Discharge Instructions (Signed)
You were evaluated for right leg swelling, and although no certain cause was found, and your exam and evaluation are reassuring in the emergency department today.  I have recommended trying Lasix for 3 days to see if this helps relieve the swelling. Follow up with your primary care doctor next week for reevaluation.  Return to the emergency department for any worsening condition quitting worsening pain, skin rash, fever, or any other symptoms concerning to you.   Peripheral Edema You have swelling in your legs (peripheral edema). This swelling is due to excess accumulation of salt and water in your body. Edema may be a sign of heart, kidney or liver disease, or a side effect of a medication. It may also be due to problems in the leg veins. Elevating your legs and using special support stockings may be very helpful, if the cause of the swelling is due to poor venous circulation. Avoid long periods of standing, whatever the cause. Treatment of edema depends on identifying the cause. Chips, pretzels, pickles and other salty foods should be avoided. Restricting salt in your diet is almost always needed. Water pills (diuretics) are often used to remove the excess salt and water from your body via urine. These medicines prevent the kidney from reabsorbing sodium. This increases urine flow. Diuretic treatment may also result in lowering of potassium levels in your body. Potassium supplements may be needed if you have to use diuretics daily. Daily weights can help you keep track of your progress in clearing your edema. You should call your caregiver for follow up care as recommended. SEEK IMMEDIATE MEDICAL CARE IF:   You have increased swelling, pain, redness, or heat in your legs.  You develop shortness of breath, especially when lying down.  You develop chest or abdominal pain, weakness, or fainting.  You have a fever.   This information is not intended to replace advice given to you by your health  care provider. Make sure you discuss any questions you have with your health care provider.   Document Released: 09/16/2004 Document Revised: 11/01/2011 Document Reviewed: 02/19/2015 Elsevier Interactive Patient Education [employer].

## 2016-02-04 NOTE — Telephone Encounter (Signed)
Agree. Please call them later today to see how he is doing. Thank you

## 2016-02-04 NOTE — ED Provider Notes (Signed)
[facility] Emergency Department Provider Note   ____________________________________________  Time seen:  I have reviewed the triage vital signs and the triage nursing note.  HISTORY  Chief Complaint Claudication   Historian Patient  HPI Richard Mueller is a 60 y.o. male without any history of cardiac disease, pulmonary embolism, is here for evaluation of right lower sure he swelling since Friday. He noticed it in his foot and ankle up to his mid calf. He is having some tightness and pain which is mild to moderate in his right calf. No skin redness. No fevers. He did recently fly to [place] and have significant amount driving as well.  Denies chest pain or shortness of breath or palpitations.    Past Medical History  Diagnosis Date  . Obesity, unspecified   . Esophageal reflux   . Allergic rhinitis, cause unspecified   . Osteoarthrosis, unspecified whether generalized or localized, unspecified site   . GERD (gastroesophageal reflux disease)   . Hypertension   . Hyperlipidemia   . Metabolic syndrome   . Bronchitis   . Osteoarthritis   . Allergy   . Right upper quadrant pain     Patient Active Problem List   Diagnosis Date Noted  . History of squamous cell carcinoma of skin 05/26/2015  . GERD without esophagitis 05/26/2015  . Family history of cardiac disorder 03/17/2015  . Gastro-esophageal reflux disease without esophagitis 03/17/2015  . Obesity (BMI 30-39.9) 03/17/2015  . Osteoarthrosis 03/17/2015  . Perennial allergic rhinitis with seasonal variation 03/17/2015  . Hyperlipidemia   . Hypertension   . Dysmetabolic syndrome [id number]    Past Surgical History  Procedure Laterality Date  . Orthopedic surgery    . Colonoscopy    . Skin cancer destruction Left 03/06/2015    Hand- Dr. Phillip Heal    Current Outpatient Rx  Name  Route  Sig  Dispense  Refill  . aspirin 81 MG tablet   Oral   Take 81 mg by mouth daily.         Marland Kitchen  atorvastatin (LIPITOR) 20 MG tablet   Oral   Take 1 tablet (20 mg total) by mouth daily.   90 tablet   3   . fexofenadine-pseudoephedrine (ALLEGRA-D 12 HOUR) 60-120 MG per tablet   Oral   Take 1 tablet by mouth every 12 (twelve) hours. Reported on 10/08/2015         . fish oil-omega-3 fatty acids 1000 MG capsule   Oral   Take 1 g by mouth daily.          . furosemide (LASIX) 20 MG tablet   Oral   Take 1 tablet (20 mg total) by mouth daily.   3 tablet   0   . glucosamine-chondroitin 500-400 MG tablet   Oral   Take 1 tablet by mouth daily.          Marland Kitchen losartan (COZAAR) 50 MG tablet   Oral   Take 1 tablet (50 mg total) by mouth daily.   90 tablet   3   . Multiple Vitamin (MULTIVITAMIN) tablet   Oral   Take 1 tablet by mouth daily.         . ranitidine (ZANTAC) 150 MG tablet   Oral   Take 150 mg by mouth at bedtime.           Allergies Ace inhibitors and Lisinopril  Family History  Problem Relation Age of Onset  . Heart disease Mother   . Heart  attack Mother   . COPD Mother   . Heart disease Father   . Hypertension Father   . COPD Father   . Cancer Father      Prostate and Skin  . COPD Sister   . Depression Daughter     1  . Heart attack Brother 54    2014    Social History Social History  Substance Use Topics  . Smoking status: Former Smoker -- 1.00 packs/day for 15 years    Types: Cigarettes    Start date: 08/24/1987    Quit date: 08/26/2002  . Smokeless tobacco: Never Used  . Alcohol Use: 0.0 oz/week    0 Standard drinks or equivalent per week     Comment: ocassional    Review of Systems  Constitutional: Negative for fever. Eyes: Negative for visual changes. ENT: Negative for sore throat. Cardiovascular: Negative for chest pain. Respiratory: Negative for shortness of breath. Gastrointestinal: Negative for abdominal pain, vomiting and diarrhea. Genitourinary: Negative for dysuria. Musculoskeletal: Negative for back pain. Skin:  Negative for rash. Neurological: Negative for headache. 10 point Review of Systems otherwise negative ____________________________________________   PHYSICAL EXAM:  VITAL SIGNS: ED Triage Vitals  Enc Vitals Group     BP 02/04/16 0901 111/61 mmHg     Pulse Rate 02/04/16 0901 72     Resp 02/04/16 0901 18     Temp 02/04/16 0901 97.9 F (36.6 C)     Temp Source 02/04/16 0901 Oral     SpO2 02/04/16 0901 96 %     Weight 02/04/16 0901 223 lb (101.152 kg)     Height 02/04/16 0901 5\' 11"  (1.803 m)     Head Cir --      Peak Flow --      Pain Score 02/04/16 0902 7     Pain Loc --      Pain Edu? --      Excl. in [place]? --      Constitutional: Alert and oriented. Well appearing and in no distress. HEENT   Head: Normocephalic and atraumatic.      Eyes: Conjunctivae are normal. PERRL. Normal extraocular movements.      Ears:         Nose: No congestion/rhinnorhea.   Mouth/Throat: Mucous membranes are moist.   Neck: No stridor. Cardiovascular/Chest: Normal rate, regular rhythm.  No murmurs, rubs, or gallops. Respiratory: Normal respiratory effort without tachypnea nor retractions. Breath sounds are clear and equal bilaterally. No wheezes/rales/rhonchi. Gastrointestinal:  Genitourinary/rectal:Deferred Musculoskeletal: Right lower extremity 1+ pitting edema midshin to foot, some tenderness to palpation. Left lower extremity is normal. A cellulitis or skin rash patient. Neurologic:  Normal speech and language. No gross or focal neurologic deficits are appreciated. Skin:  Skin is warm, dry and intact. No rash noted. Psychiatric: Mood and affect are normal. Speech and behavior are normal. Patient exhibits appropriate insight and judgment.  ____________________________________________   EKG I, Lisa Roca, MD, the attending physician have personally viewed and interpreted all ECGs.  None ____________________________________________  LABS (pertinent positives/negatives)  Labs  Reviewed  BASIC METABOLIC PANEL - Abnormal; Notable for the following:    Glucose, Bld 101 (*)    All other components within normal limits  CBC WITH DIFFERENTIAL/PLATELET  BRAIN NATRIURETIC PEPTIDE    ____________________________________________  RADIOLOGY All Xrays were viewed by me. Imaging interpreted by Radiologist.  Ultrasound right lower shotty: No evidence of right lower DVT __________________________________________  PROCEDURES  Procedure(s) performed: None  Critical Care performed: None  ____________________________________________   ED COURSE / ASSESSMENT AND PLAN  Pertinent labs & imaging results that were available during my care of the patient were reviewed by me and considered in my medical decision making (see chart for details).   Patient has traveling risk factor for possible DVT with a complaint of right lower sure he swelling and pain, however his ultrasound is negative.  He's not having symptoms of overt congestive heart failure. I did discuss a few days of Lasix to help with the lower extremity edema, after checking laboratory studies and his kidney function is good.  Primary clinic follow-up with his primary care physician.  Skin is not consistent with cellulitis.     CONSULTATIONS:   None   Patient / Family / Caregiver informed of clinical course, medical decision-making process, and agree with plan.   I discussed return precautions, follow-up instructions, and discharged instructions with patient and/or family.   ___________________________________________   FINAL CLINICAL IMPRESSION(S) / ED DIAGNOSES   Final diagnoses:  Peripheral edema              Note: This dictation was prepared with Dragon dictation. Any transcriptional errors that result from this process are unintentional   Lisa Roca, MD 02/05/16 1112

## 2016-02-04 NOTE — ED Notes (Signed)
Pt presents to ED with reports of right lower leg swelling and pain when walking. Pt denies injury. Pt reports recent travel to [place], pt states flew to [place], drove 3000 miles while on vacation and flew back to [place] this past Saturday. Pt with strong pedal pulses to right foot.

## 2016-02-04 NOTE — ED Notes (Signed)
Patient transported to Ultrasound 

## 2016-02-09 ENCOUNTER — Ambulatory Visit (INDEPENDENT_AMBULATORY_CARE_PROVIDER_SITE_OTHER): Payer: 59 | Admitting: Family Medicine

## 2016-02-09 ENCOUNTER — Encounter: Payer: Self-pay | Admitting: Family Medicine

## 2016-02-09 VITALS — BP 118/72 | HR 76 | Temp 97.9°F | Resp 16 | Wt 226.6 lb

## 2016-02-09 DIAGNOSIS — R600 Localized edema: Secondary | ICD-10-CM

## 2016-02-09 DIAGNOSIS — M6588 Other synovitis and tenosynovitis, other site: Secondary | ICD-10-CM

## 2016-02-09 DIAGNOSIS — M7750 Other enthesopathy of unspecified foot: Secondary | ICD-10-CM

## 2016-02-09 DIAGNOSIS — M25571 Pain in right ankle and joints of right foot: Secondary | ICD-10-CM

## 2016-02-09 MED ORDER — DICLOFENAC SODIUM 75 MG PO TBEC: 75.0000 mg | DELAYED_RELEASE_TABLET | Freq: Two times a day (BID) | ORAL | Status: DC

## 2016-02-09 NOTE — Progress Notes (Signed)
Name: Richard Mueller   MRN: [id number]    DOB: [birth year]   Date:02/09/2016       Progress Note  Subjective  Chief Complaint  Chief Complaint  Patient presents with  . Follow-up    patient is here for his ER f/u. patient recently took a very long trip that resulted in right lower leg pain with tightness and redness.    HPI  Right foot pain: they were travelling in [place] and he drove daily to a total of 3000 miles in 16 days days. He states he develop pain when he started to fly home. He did not use cruise control much. He went to [facility] for evaluation of DVT and it was negative.    Patient Active Problem List   Diagnosis Date Noted  . History of squamous cell carcinoma of skin 05/26/2015  . GERD without esophagitis 05/26/2015  . Family history of cardiac disorder 03/17/2015  . Gastro-esophageal reflux disease without esophagitis 03/17/2015  . Obesity (BMI 30-39.9) 03/17/2015  . Osteoarthrosis 03/17/2015  . Perennial allergic rhinitis with seasonal variation 03/17/2015  . Hyperlipidemia   . Hypertension   . Dysmetabolic syndrome 87/56/4332    Past Surgical History  Procedure Laterality Date  . Orthopedic surgery    . Colonoscopy    . Skin cancer destruction Left 03/06/2015    Hand- Dr. Phillip Heal    Family History  Problem Relation Age of Onset  . Heart disease Mother   . Heart attack Mother   . COPD Mother   . Heart disease Father   . Hypertension Father   . COPD Father   . Cancer Father      Prostate and Skin  . COPD Sister   . Depression Daughter     1  . Heart attack Brother 54    2014    Social History   Social History  . Marital Status: Married    Spouse Name: N/A  . Number of Children: N/A  . Years of Education: N/A   Occupational History  . Not on file.   Social History Main Topics  . Smoking status: Former Smoker -- 1.00 packs/day for 15 years    Types: Cigarettes    Start date: 08/24/1987    Quit date: 08/26/2002  . Smokeless tobacco: Never  Used  . Alcohol Use: 0.0 oz/week    0 Standard drinks or equivalent per week     Comment: ocassional  . Drug Use: No  . Sexual Activity:    Partners: Female   Other Topics Concern  . Not on file   Social History Narrative     Current outpatient prescriptions:  .  aspirin 81 MG tablet, Take 81 mg by mouth daily., Disp: , Rfl:  .  atorvastatin (LIPITOR) 20 MG tablet, Take 1 tablet (20 mg total) by mouth daily., Disp: 90 tablet, Rfl: 3 .  fexofenadine (ALLEGRA) 180 MG tablet, Take 180 mg by mouth daily., Disp: , Rfl:  .  glucosamine-chondroitin 500-400 MG tablet, Take 1 tablet by mouth daily. , Disp: , Rfl:  .  losartan (COZAAR) 50 MG tablet, Take 1 tablet (50 mg total) by mouth daily., Disp: 90 tablet, Rfl: 3 .  Multiple Vitamin (MULTIVITAMIN) tablet, Take 1 tablet by mouth daily., Disp: , Rfl:  .  ranitidine (ZANTAC) 150 MG tablet, Take 150 mg by mouth at bedtime., Disp: , Rfl:  .  diclofenac (VOLTAREN) 75 MG EC tablet, Take 1 tablet (75 mg total) by mouth 2 (two) times  daily., Disp: 60 tablet, Rfl: 0 .  fish oil-omega-3 fatty acids 1000 MG capsule, Take 1 g by mouth daily. , Disp: , Rfl:   Allergies  Allergen Reactions  . Lisinopril     Rash, pins and needles in arms     ROS  Ten systems reviewed and is negative except as mentioned in HPI   Objective  Filed Vitals:   02/09/16 1346  BP: 118/72  Pulse: 76  Temp: 97.9 F (36.6 C)  TempSrc: Oral  Resp: 16  Weight: 226 lb 9.6 oz (102.785 kg)  SpO2: 96%    Body mass index is 31.62 kg/(m^2).  Physical Exam  Constitutional: Patient appears well-developed and well-nourished.  No distress.  HEENT: head atraumatic, normocephalic, pupils equal and reactive to light,  neck supple, throat within normal limits Cardiovascular: Normal rate, regular rhythm and normal heart sounds.  No murmur heard. No BLE edema. Pulmonary/Chest: Effort normal and breath sounds normal. No respiratory distress. Abdominal: Soft.  There is no  tenderness. Psychiatric: Patient has a normal mood and affect. behavior is normal. Judgment and thought content normal. Muscular Skeletal: pain during dorsiflex of the right foot, edema, normal plantar flexion, no redness or increase in warmth  Recent Results (from the past 2160 hour(s))  Brain natriuretic peptide     Status: None   Collection Time: 02/04/16 10:35 AM  Result Value Ref Range   B Natriuretic Peptide 16.0 0.0 - 100.0 pg/mL  Basic metabolic panel     Status: Abnormal   Collection Time: 02/04/16 10:35 AM  Result Value Ref Range   Sodium 139 135 - 145 mmol/L   Potassium 4.2 3.5 - 5.1 mmol/L   Chloride 106 101 - 111 mmol/L   CO2 25 22 - 32 mmol/L   Glucose, Bld 101 (H) 65 - 99 mg/dL   BUN 16 6 - 20 mg/dL   Creatinine, Ser 8.20 0.61 - 1.24 mg/dL   Calcium 9.0 8.9 - 22.7 mg/dL   GFR calc non Af Amer >60 >60 mL/min   GFR calc Af Amer >60 >60 mL/min    Comment: (NOTE) The eGFR has been calculated using the CKD EPI equation. This calculation has not been validated in all clinical situations. eGFR's persistently <60 mL/min signify possible Chronic Kidney Disease.    Anion gap 8 5 - 15  CBC with Differential     Status: None   Collection Time: 02/04/16 10:35 AM  Result Value Ref Range   WBC 5.4 3.8 - 10.6 K/uL   RBC 4.60 4.40 - 5.90 MIL/uL   Hemoglobin 13.9 13.0 - 18.0 g/dL   HCT 45.8 90.1 - 08.9 %   MCV 89.7 80.0 - 100.0 fL   MCH 30.3 26.0 - 34.0 pg   MCHC 33.7 32.0 - 36.0 g/dL   RDW 93.2 21.9 - 09.1 %   Platelets 178 150 - 440 K/uL   Neutrophils Relative % 55 %   Neutro Abs 3.0 1.4 - 6.5 K/uL   Lymphocytes Relative 30 %   Lymphs Abs 1.6 1.0 - 3.6 K/uL   Monocytes Relative 11 %   Monocytes Absolute 0.6 0.2 - 1.0 K/uL   Eosinophils Relative 3 %   Eosinophils Absolute 0.1 0 - 0.7 K/uL   Basophils Relative 1 %   Basophils Absolute 0.0 0 - 0.1 K/uL    PHQ2/9: Depression screen [facility] 2/9 02/09/2016 10/08/2015 05/26/2015  Decreased Interest 0 0 0  Down, Depressed,  Hopeless 0 0 0  PHQ - 2 Score  0 0 0    Fall Risk: Fall Risk  02/09/2016 10/08/2015 05/26/2015  Falls in the past year? No No No    Functional Status Survey: Is the patient deaf or have difficulty hearing?: Yes (patient stated that he cannot hear from behind him.) Does the patient have difficulty seeing, even when wearing glasses/contacts?: Yes (sometimes with up close items) Does the patient have difficulty concentrating, remembering, or making decisions?: No Does the patient have difficulty walking or climbing stairs?: Yes (due to leg discomfort) Does the patient have difficulty dressing or bathing?: No Does the patient have difficulty doing errands alone such as visiting a doctor's office or shopping?: No   Assessment & Plan  1. Right ankle pain  - diclofenac (VOLTAREN) 75 MG EC tablet; Take 1 tablet (75 mg total) by mouth 2 (two) times daily.  Dispense: 60 tablet; Refill: 0  2. Leg edema, right  - diclofenac (VOLTAREN) 75 MG EC tablet; Take 1 tablet (75 mg total) by mouth 2 (two) times daily.  Dispense: 60 tablet; Refill: 0  3. Tendinitis of ankle or foot  - diclofenac (VOLTAREN) 75 MG EC tablet; Take 1 tablet (75 mg total) by mouth 2 (two) times daily.  Dispense: 60 tablet; Refill: 0

## 2016-02-26 ENCOUNTER — Other Ambulatory Visit: Payer: Self-pay | Admitting: Family Medicine

## 2016-02-26 DIAGNOSIS — E785 Hyperlipidemia, unspecified: Secondary | ICD-10-CM

## 2016-02-26 NOTE — Telephone Encounter (Signed)
Butch Penny (wife) is asking that you change husband pharmacy to [facility]. She is asking that you please send his atorvastatin and losartan to employee pharmacy. She will call the mail order and cancel the order so she will not be charged. Thank you

## 2016-02-26 NOTE — Telephone Encounter (Signed)
Refill request was sent to Dr. Krichna Sowles for approval and submission.  

## 2016-02-27 MED ORDER — LOSARTAN POTASSIUM 50 MG PO TABS: 50.0000 mg | ORAL_TABLET | Freq: Every day | ORAL | Status: DC

## 2016-02-27 MED ORDER — ATORVASTATIN CALCIUM 20 MG PO TABS: 20.0000 mg | ORAL_TABLET | Freq: Every day | ORAL | Status: DC

## 2016-06-16 ENCOUNTER — Telehealth: Payer: Self-pay | Admitting: Family Medicine

## 2016-06-16 DIAGNOSIS — E785 Hyperlipidemia, unspecified: Secondary | ICD-10-CM

## 2016-06-16 NOTE — Telephone Encounter (Signed)
PT DAUGHTER GIVING HIM THE MESSAGE.

## 2016-06-16 NOTE — Telephone Encounter (Signed)
Patient requesting refill of Atorvastatin and Losartan to [facility].

## 2016-06-22 ENCOUNTER — Encounter: Payer: Self-pay | Admitting: Family Medicine

## 2016-06-22 ENCOUNTER — Ambulatory Visit (INDEPENDENT_AMBULATORY_CARE_PROVIDER_SITE_OTHER): Payer: 59 | Admitting: Family Medicine

## 2016-06-22 VITALS — BP 118/72 | HR 76 | Temp 97.9°F | Ht 71.0 in | Wt 219.4 lb

## 2016-06-22 DIAGNOSIS — Z125 Encounter for screening for malignant neoplasm of prostate: Secondary | ICD-10-CM | POA: Diagnosis not present

## 2016-06-22 DIAGNOSIS — E8881 Metabolic syndrome: Secondary | ICD-10-CM | POA: Diagnosis not present

## 2016-06-22 DIAGNOSIS — Z23 Encounter for immunization: Secondary | ICD-10-CM | POA: Diagnosis not present

## 2016-06-22 DIAGNOSIS — K219 Gastro-esophageal reflux disease without esophagitis: Secondary | ICD-10-CM | POA: Diagnosis not present

## 2016-06-22 DIAGNOSIS — E782 Mixed hyperlipidemia: Secondary | ICD-10-CM

## 2016-06-22 DIAGNOSIS — I10 Essential (primary) hypertension: Secondary | ICD-10-CM | POA: Diagnosis not present

## 2016-06-22 DIAGNOSIS — Z79899 Other long term (current) drug therapy: Secondary | ICD-10-CM

## 2016-06-22 LAB — POCT GLYCOSYLATED HEMOGLOBIN (HGB A1C): Hemoglobin A1C: 5.9

## 2016-06-22 MED ORDER — ATORVASTATIN CALCIUM 20 MG PO TABS: 20.0000 mg | ORAL_TABLET | Freq: Every day | ORAL | 1 refills | Status: DC

## 2016-06-22 MED ORDER — LOSARTAN POTASSIUM 50 MG PO TABS: 50.0000 mg | ORAL_TABLET | Freq: Every day | ORAL | 1 refills | Status: DC

## 2016-06-22 NOTE — Progress Notes (Signed)
Name: Richard Mueller   MRN: [id number]    DOB: 1956-02-28   Date:06/22/2016       Progress Note  Subjective  Chief Complaint  Chief Complaint  Patient presents with  . Medication Refill    atorvastatin and losartan    HPI  Hyperlipidemia: taking Atorvastatin, due for labs.  He denies myalgia or chest pain   HTN: taking Losartan, bp was at goal when last seen, but is at goal, no chest pain or palpitation   Plantar Fascitis: left foot, wearing insoles, band is doing well since seen by podiatrist.   GERD: stopped medications, he denies heartburn, he has decreased caffeine intake, no indigestion  Metabolic Syndrome: he has been eating healthier, cutting down on sweet tea, down to one glass daily, he is active outdoors during the summer and now that is cold he is exercising on his treadmill three times a week for 45 minutes. No polyphagia, polydipsia or polyuria  Patient Active Problem List   Diagnosis Date Noted  . History of squamous cell carcinoma of skin 05/26/2015  . GERD without esophagitis 05/26/2015  . Family history of cardiac disorder 03/17/2015  . Gastro-esophageal reflux disease without esophagitis 03/17/2015  . Obesity (BMI 30-39.9) 03/17/2015  . Osteoarthrosis 03/17/2015  . Perennial allergic rhinitis with seasonal variation 03/17/2015  . Hyperlipidemia   . Hypertension   . Dysmetabolic syndrome [id number]    Past Surgical History:  Procedure Laterality Date  . COLONOSCOPY    . ORTHOPEDIC SURGERY    . SKIN CANCER DESTRUCTION Left 03/06/2015   Hand- Dr. Phillip Heal    Family History  Problem Relation Age of Onset  . Heart disease Mother   . Heart attack Mother   . COPD Mother   . Heart disease Father   . Hypertension Father   . COPD Father   . Cancer Father      Prostate and Skin  . COPD Sister   . Depression Daughter     1  . Heart attack Brother 54    2014    Social History   Social History  . Marital status: Married    Spouse name: N/A  .  Number of children: N/A  . Years of education: N/A   Occupational History  . Not on file.   Social History Main Topics  . Smoking status: Former Smoker    Packs/day: 1.00    Years: 15.00    Types: Cigarettes    Start date: 08/24/1987    Quit date: 08/26/2002  . Smokeless tobacco: Never Used  . Alcohol use 0.0 oz/week     Comment: ocassional  . Drug use: No  . Sexual activity: Yes    Partners: Female   Other Topics Concern  . Not on file   Social History Narrative  . No narrative on file     Current Outpatient Prescriptions:  .  aspirin 81 MG tablet, Take 81 mg by mouth daily., Disp: , Rfl:  .  atorvastatin (LIPITOR) 20 MG tablet, Take 1 tablet (20 mg total) by mouth daily., Disp: 90 tablet, Rfl: 1 .  fexofenadine (ALLEGRA) 180 MG tablet, Take 180 mg by mouth daily., Disp: , Rfl:  .  fish oil-omega-3 fatty acids 1000 MG capsule, Take 1 g by mouth daily. , Disp: , Rfl:  .  losartan (COZAAR) 50 MG tablet, Take 1 tablet (50 mg total) by mouth daily., Disp: 90 tablet, Rfl: 1 .  Multiple Vitamin (MULTIVITAMIN) tablet, Take 1 tablet by mouth daily.,  Disp: , Rfl:   Allergies  Allergen Reactions  . Lisinopril     Rash, pins and needles in arms     ROS  Constitutional: Negative for fever or significant  weight change.  Respiratory: Negative for cough and shortness of breath.   Cardiovascular: Negative for chest pain or palpitations.  Gastrointestinal: Negative for abdominal pain, no bowel changes.  Musculoskeletal: Negative for gait problem or joint swelling.  Skin: Negative for rash.  Neurological: Negative for dizziness or headache.  No other specific complaints in a complete review of systems (except as listed in HPI above).  Objective  Vitals:   06/22/16 1531  BP: 118/72  Pulse: 76  Temp: 97.9 F (36.6 C)  SpO2: 98%  Weight: 219 lb 6.4 oz (99.5 kg)  Height: 5\' 11"  (1.803 m)    Body mass index is 30.6 kg/m.  Physical Exam  Constitutional: Patient appears  well-developed and well-nourished. Obese No distress.  HEENT: head atraumatic, normocephalic, pupils equal and reactive to light,  neck supple, throat within normal limits Cardiovascular: Normal rate, regular rhythm and normal heart sounds.  No murmur heard. No BLE edema. Pulmonary/Chest: Effort normal and breath sounds normal. No respiratory distress. Abdominal: Soft.  There is no tenderness. Psychiatric: Patient has a normal mood and affect. behavior is normal. Judgment and thought content normal.  PHQ2/9: Depression screen [facility] 2/9 06/22/2016 02/09/2016 10/08/2015 05/26/2015  Decreased Interest 0 0 0 0  Down, Depressed, Hopeless 0 0 0 0  PHQ - 2 Score 0 0 0 0    Fall Risk: Fall Risk  06/22/2016 02/09/2016 10/08/2015 05/26/2015  Falls in the past year? No No No No    Functional Status Survey: Is the patient deaf or have difficulty hearing?: No Does the patient have difficulty seeing, even when wearing glasses/contacts?: Yes (glasses) Does the patient have difficulty concentrating, remembering, or making decisions?: No Does the patient have difficulty walking or climbing stairs?: No Does the patient have difficulty dressing or bathing?: No Does the patient have difficulty doing errands alone such as visiting a doctor's office or shopping?: No    Assessment & Plan  1. Essential hypertension  At goal - losartan (COZAAR) 50 MG tablet; Take 1 tablet (50 mg total) by mouth daily.  Dispense: 90 tablet; Refill: 1  2. Dysmetabolic syndrome  He is going to dietician and is learning about diet  - Insulin, Fasting  3. GERD without esophagitis  Doing well, off medication   4. Mixed hyperlipidemia  - atorvastatin (LIPITOR) 20 MG tablet; Take 1 tablet (20 mg total) by mouth daily.  Dispense: 90 tablet; Refill: 1 - Lipid panel  5. Long-term use of high-risk medication  - COMPLETE METABOLIC PANEL WITH GFR  6. Prostate cancer screening  - PSA   7. Needs flu shot  -flu shot

## 2016-07-02 ENCOUNTER — Encounter: Payer: Self-pay | Admitting: Family Medicine

## 2016-07-02 LAB — COMPLETE METABOLIC PANEL WITH GFR
ALBUMIN: 4.5 g/dL (ref 3.6–5.1)
ALK PHOS: 55 U/L (ref 40–115)
ALT: 21 U/L (ref 9–46)
AST: 20 U/L (ref 10–35)
BILIRUBIN TOTAL: 0.5 mg/dL (ref 0.2–1.2)
BUN: 18 mg/dL (ref 7–25)
CALCIUM: 9.3 mg/dL (ref 8.6–10.3)
CO2: 26 mmol/L (ref 20–31)
CREATININE: 1.11 mg/dL (ref 0.70–1.25)
Chloride: 101 mmol/L (ref 98–110)
GFR, Est African American: 83 mL/min (ref >=60)
GFR, Est Non African American: 72 mL/min (ref >=60)
GLUCOSE: 95 mg/dL (ref 65–99)
Potassium: 5 mmol/L (ref 3.5–5.3)
SODIUM: 136 mmol/L (ref 135–146)
TOTAL PROTEIN: 6.9 g/dL (ref 6.1–8.1)

## 2016-07-02 LAB — INSULIN, FASTING: Insulin fasting, serum: 6.3 u[IU]/mL (ref 2.0–19.6)

## 2016-07-02 LAB — LIPID PANEL
CHOL/HDL RATIO: 3.2 ratio (ref <5.0)
CHOLESTEROL: 151 mg/dL (ref <200)
HDL: 47 mg/dL (ref >40)
LDL Cholesterol: 85 mg/dL
Triglycerides: 93 mg/dL (ref <150)
VLDL: 19 mg/dL (ref <30)

## 2016-07-02 LAB — PSA: PSA: 1.2 ng/mL (ref <=4.0)

## 2016-07-05 ENCOUNTER — Other Ambulatory Visit: Payer: Self-pay | Admitting: Family Medicine

## 2016-07-05 MED ORDER — AZITHROMYCIN 250 MG PO TABS: ORAL_TABLET | ORAL | 0 refills | Status: DC

## 2016-07-05 NOTE — Telephone Encounter (Signed)
Rx request was sent to Dr. Steele Sizer for approval and submission.

## 2016-07-05 NOTE — Telephone Encounter (Signed)
Patient stated that he would like a Z-pack sent into the [facility].  Pt stated that he is having issues with his sinus.  Please call once complete.

## 2016-08-31 DIAGNOSIS — L570 Actinic keratosis: Secondary | ICD-10-CM | POA: Diagnosis not present

## 2016-09-21 ENCOUNTER — Ambulatory Visit (INDEPENDENT_AMBULATORY_CARE_PROVIDER_SITE_OTHER): Payer: 59 | Admitting: Family Medicine

## 2016-09-21 ENCOUNTER — Encounter: Payer: Self-pay | Admitting: Family Medicine

## 2016-09-21 VITALS — BP 122/70 | HR 70 | Temp 98.2°F | Resp 16 | Ht 68.25 in | Wt 211.6 lb

## 2016-09-21 DIAGNOSIS — E8881 Metabolic syndrome: Secondary | ICD-10-CM

## 2016-09-21 DIAGNOSIS — I10 Essential (primary) hypertension: Secondary | ICD-10-CM

## 2016-09-21 DIAGNOSIS — Z23 Encounter for immunization: Secondary | ICD-10-CM | POA: Diagnosis not present

## 2016-09-21 DIAGNOSIS — E782 Mixed hyperlipidemia: Secondary | ICD-10-CM | POA: Diagnosis not present

## 2016-09-21 DIAGNOSIS — Z0000 Encounter for general adult medical examination without abnormal findings: Secondary | ICD-10-CM | POA: Diagnosis not present

## 2016-09-21 MED ORDER — LOSARTAN POTASSIUM 50 MG PO TABS
50.0000 mg | ORAL_TABLET | Freq: Every day | ORAL | 1 refills | Status: DC
Start: 2016-09-21 — End: 2016-12-20

## 2016-09-21 MED ORDER — ATORVASTATIN CALCIUM 20 MG PO TABS: 20.0000 mg | ORAL_TABLET | Freq: Every day | ORAL | 1 refills | Status: DC

## 2016-09-21 NOTE — Progress Notes (Signed)
Name: Richard Mueller   MRN: [id number]    DOB: February 10, 1956   Date:09/21/2016       Progress Note  Subjective  Chief Complaint  Chief Complaint  Patient presents with  . Annual Exam    patient is active. He does the treadmill 30 mins/ 3 times per week. patient sleeps about 7-7.5 hrs per day. patient has changed his lifestyle after attending a diabetic class. He has decreased his snacks & has eliminated eating out a lot.    HPI  Male Exam: he is sexually active, denies ED or lack of libido. He has changed his life style and is losing weight. He denies chest pain or decrease in exercise tolerance.   Hyperlipidemia: taking Atorvastatin, reviewed labs with patient.   He denies myalgia or chest pain   HTN: taking Losartan, bp was at goal , no chest pain or palpitation   Metabolic Syndrome: he has been [facility] the [facility] style center, lost 8 lbs since last visit, no longer skipping breakfast, eating lunch, counting calories - max of 2200 daily, and exercising. He started it Nov 2017. He denies polyphagia, polydipsia and polyuria  IPSS Questionnaire (AUA-7): Over the past month.   1)  How often have you had a sensation of not emptying your bladder completely after you finish urinating?  0 - Not at all  2)  How often have you had to urinate again less than two hours after you finished urinating? 1 - Less than 1 time in 5  3)  How often have you found you stopped and started again several times when you urinated?  0 - Not at all  4) How difficult have you found it to postpone urination?  0 - Not at all  5) How often have you had a weak urinary stream?  0 - Not at all  6) How often have you had to push or strain to begin urination?  0 - Not at all  7) How many times did you most typically get up to urinate from the time you went to bed until the time you got up in the morning?  1 - 1 time  Total score:  0-7 mildly symptomatic   8-19 moderately symptomatic   20-35 severely symptomatic       Patient Active Problem List   Diagnosis Date Noted  . History of squamous cell carcinoma of skin 05/26/2015  . GERD without esophagitis 05/26/2015  . Family history of cardiac disorder 03/17/2015  . Gastro-esophageal reflux disease without esophagitis 03/17/2015  . Obesity (BMI 30-39.9) 03/17/2015  . Osteoarthrosis 03/17/2015  . Perennial allergic rhinitis with seasonal variation 03/17/2015  . Hyperlipidemia   . Hypertension   . Dysmetabolic syndrome [id number]    Past Surgical History:  Procedure Laterality Date  . COLONOSCOPY    . ORTHOPEDIC SURGERY    . SKIN CANCER DESTRUCTION Left 03/06/2015   Hand- Dr. Phillip Heal    Family History  Problem Relation Age of Onset  . Heart disease Mother   . Heart attack Mother   . COPD Mother   . Heart disease Father   . Hypertension Father   . COPD Father   . Cancer Father      Prostate and Skin  . COPD Sister   . Depression Daughter     1  . Heart attack Brother 54    2014    Social History   Social History  . Marital status: Married    Spouse name: N/A  .  Number of children: N/A  . Years of education: N/A   Occupational History  . Not on file.   Social History Main Topics  . Smoking status: Former Smoker    Packs/day: 1.00    Years: 15.00    Types: Cigarettes    Start date: 08/24/1987    Quit date: 08/26/2002  . Smokeless tobacco: Never Used  . Alcohol use 0.0 oz/week     Comment: ocassional  . Drug use: No  . Sexual activity: Yes    Partners: Female   Other Topics Concern  . Not on file   Social History Narrative  . No narrative on file     Current Outpatient Prescriptions:  .  Calcium 200 MG TABS, Take by mouth., Disp: , Rfl:  .  aspirin 81 MG tablet, Take 81 mg by mouth daily., Disp: , Rfl:  .  atorvastatin (LIPITOR) 20 MG tablet, Take 1 tablet (20 mg total) by mouth daily., Disp: 90 tablet, Rfl: 1 .  fexofenadine (ALLEGRA) 180 MG tablet, Take 180 mg by mouth daily., Disp: , Rfl:  .  fish  oil-omega-3 fatty acids 1000 MG capsule, Take 1 g by mouth daily. , Disp: , Rfl:  .  losartan (COZAAR) 50 MG tablet, Take 1 tablet (50 mg total) by mouth daily., Disp: 90 tablet, Rfl: 1 .  Multiple Vitamin (MULTIVITAMIN) tablet, Take 1 tablet by mouth daily., Disp: , Rfl:   Allergies  Allergen Reactions  . Lisinopril     Rash, pins and needles in arms     ROS  Constitutional: Negative for fever, positive for  weight change.  Respiratory: Negative for cough and shortness of breath.   Cardiovascular: Negative for chest pain or palpitations.  Gastrointestinal: Negative for abdominal pain, no bowel changes.  Musculoskeletal: Negative for gait problem or joint swelling.  Skin: Negative for rash.  Neurological: Negative for dizziness or headache.  No other specific complaints in a complete review of systems (except as listed in HPI above).  Objective  Vitals:   09/21/16 0836  BP: 122/70  Pulse: 70  Resp: 16  Temp: 98.2 F (36.8 C)  TempSrc: Oral  SpO2: 98%  Weight: 211 lb 9.6 oz (96 kg)  Height: 5' 8.25" (1.734 m)    Body mass index is 31.94 kg/m.  Physical Exam  Constitutional: Patient appears well-developed and obese No distress.  HENT: Head: Normocephalic and atraumatic. Ears: B TMs ok, no erythema or effusion; Nose: Nose normal. Mouth/Throat: Oropharynx is clear and moist. No oropharyngeal exudate.  Eyes: Conjunctivae and EOM are normal. Pupils are equal, round, and reactive to light. No scleral icterus.  Neck: Normal range of motion. Neck supple. No JVD present. No thyromegaly present.  Cardiovascular: Normal rate, regular rhythm and normal heart sounds.  No murmur heard. No BLE edema. Pulmonary/Chest: Effort normal and breath sounds normal. No respiratory distress. Abdominal: Soft. Bowel sounds are normal, no distension. There is no tenderness. no masses MALE GENITALIA: Normal descended testes bilaterally, no masses palpated, no hernias, no lesions, no  discharge RECTAL: Prostate normal size and consistency, no rectal masses or hemorrhoids Musculoskeletal: Normal range of motion, no joint effusions. No gross deformities Neurological: he is alert and oriented to person, place, and time. No cranial nerve deficit. Coordination, balance, strength, speech and gait are normal.  Skin: Skin is warm and dry. No rash noted. No erythema.  Psychiatric: Patient has a normal mood and affect. behavior is normal. Judgment and thought content normal.  Recent Results (from  the past 2160 hour(s))  Lipid panel     Status: None   Collection Time: 07/01/16  8:04 AM  Result Value Ref Range   Cholesterol 151 <200 mg/dL    Comment: ** Please note change in reference range(s). **      Triglycerides 93 <150 mg/dL    Comment: ** Please note change in reference range(s). **      HDL 47 >40 mg/dL    Comment: ** Please note change in reference range(s). **      Total CHOL/HDL Ratio 3.2 <5.0 Ratio   VLDL 19 <30 mg/dL   LDL Cholesterol 85 mg/dL    Comment: ** Please note change in reference range(s). **     COMPLETE METABOLIC PANEL WITH GFR     Status: None   Collection Time: 07/01/16  8:04 AM  Result Value Ref Range   Sodium 136 135 - 146 mmol/L   Potassium 5.0 3.5 - 5.3 mmol/L   Chloride 101 98 - 110 mmol/L   CO2 26 20 - 31 mmol/L   Glucose, Bld 95 65 - 99 mg/dL   BUN 18 7 - 25 mg/dL   Creat 1.11 0.70 - 1.25 mg/dL    Comment:   For patients > or = 61 years of age: The upper reference limit for Creatinine is approximately 13% higher for people identified as African-American.      Total Bilirubin 0.5 0.2 - 1.2 mg/dL   Alkaline Phosphatase 55 40 - 115 U/L   AST 20 10 - 35 U/L   ALT 21 9 - 46 U/L   Total Protein 6.9 6.1 - 8.1 g/dL   Albumin 4.5 3.6 - 5.1 g/dL   Calcium 9.3 8.6 - 10.3 mg/dL   GFR, Est African American 83 >=60 mL/min   GFR, Est Non African American 72 >=60 mL/min  PSA     Status: None   Collection Time: 07/01/16  8:04 AM  Result  Value Ref Range   PSA 1.2 <=4.0 ng/mL    Comment:   The total PSA value from this assay system is standardized against the WHO standard. The test result will be approximately 20% lower when compared to the equimolar-standardized total PSA ([employer] Coul[employer]). Comparison of serial PSA results should be interpreted with this fact in mind.   This test was performed using the Siemens chemiluminescent method. Values obtained from different assay methods cannot be used interchangeably. PSA levels, regardless of value, should not be interpreted as absolute evidence of the presence or absence of disease.     Insulin, Fasting     Status: None   Collection Time: 07/01/16  8:04 AM  Result Value Ref Range   Insulin fasting, serum 6.3 2.0 - 19.6 uIU/mL    Comment:   This insulin assay shows strong cross-reactivity for some insulin analogs (lispro, aspart, and glargine) and much lower cross-reactivity with others (detemir, glulisine).   Stimulated Insulin reference intervals were established using the Siemens Immulite assay. These values are provided for general guidance only.       PHQ2/9: Depression screen [facility] 2/9 06/22/2016 02/09/2016 10/08/2015 05/26/2015  Decreased Interest 0 0 0 0  Down, Depressed, Hopeless 0 0 0 0  PHQ - 2 Score 0 0 0 0     Fall Risk: Fall Risk  06/22/2016 02/09/2016 10/08/2015 05/26/2015  Falls in the past year? No No No No      Assessment & Plan  1. Encounter for routine history and physical exam for male  Discussed importance of 150 minutes of physical activity weekly, eat two servings of fish weekly, eat one serving of tree nuts ( cashews, pistachios, pecans, almonds.Marland Kitchen) every other day, eat 6 servings of fruit/vegetables daily and drink plenty of water and avoid sweet beverages.   2. Mixed hyperlipidemia  - atorvastatin (LIPITOR) 20 MG tablet; Take 1 tablet (20 mg total) by mouth daily.  Dispense: 90 tablet; Refill: 1  3. Essential hypertension  -  losartan (COZAAR) 50 MG tablet; Take 1 tablet (50 mg total) by mouth daily.  Dispense: 90 tablet; Refill: 1  4. Dysmetabolic syndrome  He has been [facility] the [facility] center and is doing well, losing weight, eating three meals a day.   5. Need for Streptococcus pneumoniae vaccination  - Pneumococcal polysaccharide vaccine 23-valent greater than or equal to 2yo subcutaneous/IM

## 2016-11-30 ENCOUNTER — Ambulatory Visit: Payer: 59 | Admitting: Cardiovascular Disease

## 2016-12-15 DIAGNOSIS — Z872 Personal history of diseases of the skin and subcutaneous tissue: Secondary | ICD-10-CM | POA: Diagnosis not present

## 2016-12-15 DIAGNOSIS — Z859 Personal history of malignant neoplasm, unspecified: Secondary | ICD-10-CM | POA: Diagnosis not present

## 2016-12-15 DIAGNOSIS — L570 Actinic keratosis: Secondary | ICD-10-CM | POA: Diagnosis not present

## 2016-12-15 DIAGNOSIS — Z85828 Personal history of other malignant neoplasm of skin: Secondary | ICD-10-CM | POA: Diagnosis not present

## 2016-12-16 ENCOUNTER — Ambulatory Visit (INDEPENDENT_AMBULATORY_CARE_PROVIDER_SITE_OTHER): Payer: 59 | Admitting: Cardiovascular Disease

## 2016-12-16 ENCOUNTER — Encounter: Payer: Self-pay | Admitting: Cardiovascular Disease

## 2016-12-16 VITALS — BP 120/72 | HR 66 | Ht 68.0 in | Wt 205.0 lb

## 2016-12-16 DIAGNOSIS — I10 Essential (primary) hypertension: Secondary | ICD-10-CM | POA: Diagnosis not present

## 2016-12-16 DIAGNOSIS — E782 Mixed hyperlipidemia: Secondary | ICD-10-CM | POA: Diagnosis not present

## 2016-12-16 NOTE — Progress Notes (Signed)
Cardiology Office Note   Date:  12/16/2016   ID:  Richard Mueller, DOB Jun 02, 1956, MRN [id number]  PCP:  Loistine Chance, MD  Cardiologist:   Kathlyn Sacramento, MD   Chief Complaint  Patient presents with  . other    23mo f/u. Pt states he is doing well.  Reviewed meds with pt verbally.      History of Present Illness: Richard Mueller is a 61 y.o. male who presents for a followup visit regarding hypertension and hyperlipidemia. He has no previous cardiac history. He does have a family history of CAD. He was evaluated in 2014 for atypical chest pain. He underwent a treadmill nuclear stress test which showed no evidence of ischemia with normal ejection fraction.  He has been doing very well and denies any chest pain or shortness of breath.  He has improved his diet significantly over the last year and lost 20 pounds. He is feeling much better overall with improved stamina.  Past Medical History:  Diagnosis Date  . Allergic rhinitis, cause unspecified   . Allergy   . Bronchitis   . Esophageal reflux   . GERD (gastroesophageal reflux disease)   . Hyperlipidemia   . Hypertension   . Metabolic syndrome   . Obesity, unspecified   . Osteoarthritis   . Osteoarthrosis, unspecified whether generalized or localized, unspecified site   . Right upper quadrant pain     Past Surgical History:  Procedure Laterality Date  . COLONOSCOPY    . ORTHOPEDIC SURGERY    . SKIN CANCER DESTRUCTION Left 03/06/2015   Hand- Dr. Phillip Heal     Current Outpatient Prescriptions  Medication Sig Dispense Refill  . aspirin 81 MG tablet Take 81 mg by mouth daily.    Marland Kitchen atorvastatin (LIPITOR) 20 MG tablet Take 1 tablet (20 mg total) by mouth daily. 90 tablet 1  . Calcium 200 MG TABS Take by mouth.    . fexofenadine (ALLEGRA) 180 MG tablet Take 180 mg by mouth daily.    Marland Kitchen losartan (COZAAR) 50 MG tablet Take 1 tablet (50 mg total) by mouth daily. 90 tablet 1  . Multiple Vitamin (MULTIVITAMIN) tablet Take 1  tablet by mouth daily.     No current facility-administered medications for this visit.     Allergies:   Lisinopril    Social History:  The patient  reports that he quit smoking about 14 years ago. His smoking use included Cigarettes. He started smoking about 29 years ago. He has a 15.00 pack-year smoking history. He has never used smokeless tobacco. He reports that he drinks alcohol. He reports that he does not use drugs.   Family History:  The patient's family history includes COPD in his father, mother, and sister; Cancer in his father; Depression in his daughter; Heart attack in his mother; Heart attack (age of onset: 69) in his brother; Heart disease in his father and mother; Hypertension in his father.    ROS:  Please see the history of present illness.   Otherwise, review of systems are positive for none.   All other systems are reviewed and negative.    PHYSICAL EXAM: VS:  BP 120/72 (BP Location: Left Arm, Patient Position: Sitting, Cuff Size: Normal)   Pulse 66   Ht 5\' 8"  (1.727 m)   Wt 205 lb (93 kg)   BMI 31.17 kg/m  , BMI Body mass index is 31.17 kg/m. GEN: Well nourished, well developed, in no acute distress  HEENT: normal  Neck:  no JVD, carotid bruits, or masses Cardiac: RRR; no murmurs, rubs, or gallops,no edema  Respiratory:  clear to auscultation bilaterally, normal work of breathing GI: soft, nontender, nondistended, + BS MS: no deformity or atrophy  Skin: warm and dry, no rash Neuro:  Strength and sensation are intact Psych: euthymic mood, full affect   EKG:  EKG is ordered today. The ekg ordered today demonstrates normal sinus rhythm with nonspecific T wave changes with motion artifact. One PVC.   Recent Labs: 02/04/2016: B Natriuretic Peptide 16.0; Hemoglobin 13.9; Platelets 178 07/01/2016: ALT 21; BUN 18; Creat 1.11; Potassium 5.0; Sodium 136    Lipid Panel    Component Value Date/Time   CHOL 151 07/01/2016 0804   CHOL 147 05/26/2015 0816   TRIG  93 07/01/2016 0804   TRIG 108 02/27/2013 1609   HDL 47 07/01/2016 0804   HDL 43 05/26/2015 0816   HDL 42 02/27/2013 1609   CHOLHDL 3.2 07/01/2016 0804   VLDL 19 07/01/2016 0804   LDLCALC 85 07/01/2016 0804   LDLCALC 81 05/26/2015 0816   LDLCALC 76 02/27/2013 1609      Wt Readings from Last 3 Encounters:  12/16/16 205 lb (93 kg)  09/21/16 211 lb 9.6 oz (96 kg)  06/22/16 219 lb 6.4 oz (99.5 kg)      Other studies Reviewed: Additional studies/ records that were reviewed today include: Recent labs. Review of the above records demonstrates: Listed above.   ASSESSMENT AND PLAN:  1.  Hyperlipidemia: Continue treatment with atorvastatin.  I reviewed most recent lipid profile in November which showed an LDL of 85, HDL of 47 and triglycerides 93.  I discussed with him the option of proceeding with coronary calcium score to determine the intensity of statin treatment. He wants to think about this.  2. Essential hypertension: Blood pressure is well controlled on current medications.   Disposition:   FU with me in 1 year  Signed,  Kathlyn Sacramento, MD  12/16/2016 12:20 PM    [facility]

## 2016-12-16 NOTE — Patient Instructions (Signed)
Medication Instructions: Continue same medications.   Labwork: None.   Procedures/Testing: None.   Follow-Up: 1 year with Dr. Kari Montero.   Any Additional Special Instructions Will Be Listed Below (If Applicable).     If you need a refill on your cardiac medications before your next appointment, please call your pharmacy.   

## 2016-12-20 ENCOUNTER — Ambulatory Visit (INDEPENDENT_AMBULATORY_CARE_PROVIDER_SITE_OTHER): Payer: 59 | Admitting: Family Medicine

## 2016-12-20 ENCOUNTER — Encounter: Payer: Self-pay | Admitting: Family Medicine

## 2016-12-20 VITALS — BP 122/68 | HR 68 | Temp 97.6°F | Resp 14 | Ht 68.0 in | Wt 204.9 lb

## 2016-12-20 DIAGNOSIS — I493 Ventricular premature depolarization: Secondary | ICD-10-CM | POA: Diagnosis not present

## 2016-12-20 DIAGNOSIS — E669 Obesity, unspecified: Secondary | ICD-10-CM

## 2016-12-20 DIAGNOSIS — K219 Gastro-esophageal reflux disease without esophagitis: Secondary | ICD-10-CM | POA: Diagnosis not present

## 2016-12-20 DIAGNOSIS — M7541 Impingement syndrome of right shoulder: Secondary | ICD-10-CM

## 2016-12-20 DIAGNOSIS — E782 Mixed hyperlipidemia: Secondary | ICD-10-CM | POA: Diagnosis not present

## 2016-12-20 DIAGNOSIS — E8881 Metabolic syndrome: Secondary | ICD-10-CM

## 2016-12-20 DIAGNOSIS — R634 Abnormal weight loss: Secondary | ICD-10-CM | POA: Diagnosis not present

## 2016-12-20 DIAGNOSIS — I10 Essential (primary) hypertension: Secondary | ICD-10-CM

## 2016-12-20 MED ORDER — ATORVASTATIN CALCIUM 20 MG PO TABS: 20.0000 mg | ORAL_TABLET | Freq: Every day | ORAL | 1 refills | Status: DC

## 2016-12-20 MED ORDER — LOSARTAN POTASSIUM 50 MG PO TABS: 50.0000 mg | ORAL_TABLET | Freq: Every day | ORAL | 1 refills | Status: DC

## 2016-12-20 NOTE — Progress Notes (Signed)
Name: Richard Mueller   MRN: [id number]    DOB: Jan 23, 1956   Date:12/20/2016       Progress Note  Subjective  Chief Complaint  Chief Complaint  Patient presents with  . Follow-up    patient is here for his 6 month f/u  . Hyperlipidemia    well controlled  . Hypertension    well controlled  . Gastroesophageal Reflux    well controlled  . metabolic syndrome    patient has lost about 20lbs since last visit.  . Shoulder Pain    patient thinks he has done something to his right rotator cuff. intermittent pain for weeks. hard to raise his arm at times.    HPI  Hyperlipidemia: taking Atorvastatin, reviewed labs with patient.  He denies myalgia or chest pain. Feeling well  HTN: taking Losartan, bp is at goal , no chest pain or palpitation. Sees Dr. Fletcher Anon - had stress test in 2014 because of atypical chest pain and it was normal, on EKG done by Dr. Fletcher Anon there was PVC sinus rhythm   Metabolic Syndrome: he has been going to the life style center, lost 20 lbs over the past 6 months. He went to diabetic teaching class, he is no longer skipping breakfast, eating lunch, counting calories - max of 2200 daily, and exercising. He started it Nov 2017. He denies polyphagia, polydipsia and polyuria. He is feeling well.   Right shoulder: he has intermittent pain on right shoulder, that causes difficulty raising his right shoulder. He states that last week he picked up a rock that weight just a couple of pounds and threw it to the side of his property and it caused the pain to flare. Pain is only with certain movements. Pain when present it is sharp, it affects his sleep when he rolls over to his right shoulder during the night.   Patient Active Problem List   Diagnosis Date Noted  . History of squamous cell carcinoma of skin 05/26/2015  . GERD without esophagitis 05/26/2015  . Family history of cardiac disorder 03/17/2015  . Gastro-esophageal reflux disease without esophagitis 03/17/2015  . Obesity  (BMI 30-39.9) 03/17/2015  . Osteoarthrosis 03/17/2015  . Perennial allergic rhinitis with seasonal variation 03/17/2015  . Hyperlipidemia   . Hypertension   . Dysmetabolic syndrome 17/61/6073    Past Surgical History:  Procedure Laterality Date  . COLONOSCOPY    . ORTHOPEDIC SURGERY    . SKIN CANCER DESTRUCTION Left 03/06/2015   Hand- Dr. Phillip Heal    Family History  Problem Relation Age of Onset  . Heart disease Mother   . Heart attack Mother   . COPD Mother   . Heart disease Father   . Hypertension Father   . COPD Father   . Cancer Father      Prostate and Skin  . COPD Sister   . Depression Daughter     1  . Heart attack Brother 54    2014    Social History   Social History  . Marital status: Married    Spouse name: N/A  . Number of children: N/A  . Years of education: N/A   Occupational History  . Not on file.   Social History Main Topics  . Smoking status: Former Smoker    Packs/day: 1.00    Years: 15.00    Types: Cigarettes    Start date: 08/24/1987    Quit date: 08/26/2002  . Smokeless tobacco: Never Used  . Alcohol use 0.0 oz/week  Comment: ocassional  . Drug use: No  . Sexual activity: Yes    Partners: Female    Birth control/ protection: None   Other Topics Concern  . Not on file   Social History Narrative  . No narrative on file     Current Outpatient Prescriptions:  .  Ascorbic Acid (VITAMIN C PO), Take by mouth., Disp: , Rfl:  .  aspirin 81 MG tablet, Take 81 mg by mouth daily., Disp: , Rfl:  .  atorvastatin (LIPITOR) 20 MG tablet, Take 1 tablet (20 mg total) by mouth daily., Disp: 90 tablet, Rfl: 1 .  Calcium 200 MG TABS, Take by mouth., Disp: , Rfl:  .  fexofenadine (ALLEGRA) 180 MG tablet, Take 180 mg by mouth daily., Disp: , Rfl:  .  IRON PO, Take by mouth., Disp: , Rfl:  .  losartan (COZAAR) 50 MG tablet, Take 1 tablet (50 mg total) by mouth daily., Disp: 90 tablet, Rfl: 1 .  Multiple Vitamin (MULTIVITAMIN) tablet, Take 1 tablet  by mouth daily., Disp: , Rfl:   Allergies  Allergen Reactions  . Lisinopril     Rash, pins and needles in arms     ROS  Constitutional: Negative for fever, positive for  weight change.  Respiratory: Negative for cough and shortness of breath.   Cardiovascular: Negative for chest pain or palpitations.  Gastrointestinal: Negative for abdominal pain, no bowel changes.  Musculoskeletal: Negative for gait problem or joint swelling.  Skin: Negative for rash.  Neurological: Negative for dizziness or headache.  No other specific complaints in a complete review of systems (except as listed in HPI above).  Objective  Vitals:   12/20/16 0752  BP: 122/68  Pulse: 68  Resp: 14  Temp: 97.6 F (36.4 C)  TempSrc: Oral  SpO2: 97%  Weight: 204 lb 14.4 oz (92.9 kg)  Height: 5\' 8"  (1.727 m)    Body mass index is 31.15 kg/m.  Physical Exam  Constitutional: Patient appears well-developed and well-nourished. Obese  No distress.  HEENT: head atraumatic, normocephalic, pupils equal and reactive to light,  neck supple, throat within normal limits Cardiovascular: Normal rate, regular rhythm and normal heart sounds.  No murmur heard. No BLE edema. Pulmonary/Chest: Effort normal and breath sounds normal. No respiratory distress. Abdominal: Soft.  There is no tenderness. Psychiatric: Patient has a normal mood and affect. behavior is normal. Judgment and thought content normal. Muscular Skeletal: pain during abduction or right shoulder, normal empty can sign, he has pain during palpation of lateral right shoulder.    PHQ2/9: Depression screen [facility] 2/9 12/20/2016 06/22/2016 02/09/2016 10/08/2015 05/26/2015  Decreased Interest 0 0 0 0 0  Down, Depressed, Hopeless 0 0 0 0 0  PHQ - 2 Score 0 0 0 0 0     Fall Risk: Fall Risk  12/20/2016 06/22/2016 02/09/2016 10/08/2015 05/26/2015  Falls in the past year? No No No No No     Functional Status Survey: Is the patient deaf or have difficulty hearing?:  No Does the patient have difficulty seeing, even when wearing glasses/contacts?: No Does the patient have difficulty concentrating, remembering, or making decisions?: No Does the patient have difficulty walking or climbing stairs?: No Does the patient have difficulty dressing or bathing?: No Does the patient have difficulty doing errands alone such as visiting a doctor's office or shopping?: No    Assessment & Plan  1. Mixed hyperlipidemia  - atorvastatin (LIPITOR) 20 MG tablet; Take 1 tablet (20 mg total) by mouth  daily.  Dispense: 90 tablet; Refill: 1  2. Essential hypertension  - losartan (COZAAR) 50 MG tablet; Take 1 tablet (50 mg total) by mouth daily.  Dispense: 90 tablet; Refill: 1  3. Dysmetabolic syndrome  Doing well , losing weight, changed his diet  4. GERD without esophagitis  Doing well without medication   5. Obesity (BMI 30-39.9)  Lost 20 lbs, with life style modification   6. Shoulder impingement, right  He does not want injection or NSAID's discussed PT, but he prefers home exercises. I will print out a form for him.   7. Weight loss  Doing well, secondary to life style modification

## 2016-12-20 NOTE — Patient Instructions (Signed)
Shoulder Exercises Ask your health care provider which exercises are safe for you. Do exercises exactly as told by your health care provider and adjust them as directed. It is normal to feel mild stretching, pulling, tightness, or discomfort as you do these exercises, but you should stop right away if you feel sudden pain or your pain gets worse.Do not begin these exercises until told by your health care provider. RANGE OF MOTION EXERCISES  These exercises warm up your muscles and joints and improve the movement and flexibility of your shoulder. These exercises also help to relieve pain, numbness, and tingling. These exercises involve stretching your injured shoulder directly. Exercise A: Pendulum   1. Stand near a wall or a surface that you can hold onto for balance. 2. Bend at the waist and let your left / right arm hang straight down. Use your other arm to support you. Keep your back straight and do not lock your knees. 3. Relax your left / right arm and shoulder muscles, and move your hips and your trunk so your left / right arm swings freely. Your arm should swing because of the motion of your body, not because you are using your arm or shoulder muscles. 4. Keep moving your body so your arm swings in the following directions, as told by your health care provider:  Side to side.  Forward and backward.  In clockwise and counterclockwise circles. 5. Continue each motion for __________ seconds, or for as long as told by your health care provider. 6. Slowly return to the starting position. Repeat __________ times. Complete this exercise __________ times a day. Exercise B:Flexion, Standing   1. Stand and hold a broomstick, a cane, or a similar object. Place your hands a little more than shoulder-width apart on the object. Your left / right hand should be palm-up, and your other hand should be palm-down. 2. Keep your elbow straight and keep your shoulder muscles relaxed. Push the stick down  with your healthy arm to raise your left / right arm in front of your body, and then over your head until you feel a stretch in your shoulder.  Avoid shrugging your shoulder while you raise your arm. Keep your shoulder blade tucked down toward the middle of your back. 3. Hold for __________ seconds. 4. Slowly return to the starting position. Repeat __________ times. Complete this exercise __________ times a day. Exercise C: Abduction, Standing  1. Stand and hold a broomstick, a cane, or a similar object. Place your hands a little more than shoulder-width apart on the object. Your left / right hand should be palm-up, and your other hand should be palm-down. 2. While keeping your elbow straight and your shoulder muscles relaxed, push the stick across your body toward your left / right side. Raise your left / right arm to the side of your body and then over your head until you feel a stretch in your shoulder.  Do not raise your arm above shoulder height, unless your health care provider tells you to do that.  Avoid shrugging your shoulder while you raise your arm. Keep your shoulder blade tucked down toward the middle of your back. 3. Hold for __________ seconds. 4. Slowly return to the starting position. Repeat __________ times. Complete this exercise __________ times a day. Exercise D:Internal Rotation   1. Place your left / right hand behind your back, palm-up. 2. Use your other hand to dangle an exercise band, a towel, or a similar object over your   shoulder. Grasp the band with your left / right hand so you are holding onto both ends. 3. Gently pull up on the band until you feel a stretch in the front of your left / right shoulder.  Avoid shrugging your shoulder while you raise your arm. Keep your shoulder blade tucked down toward the middle of your back. 4. Hold for __________ seconds. 5. Release the stretch by letting go of the band and lowering your hands. Repeat __________ times.  Complete this exercise __________ times a day. STRETCHING EXERCISES  These exercises warm up your muscles and joints and improve the movement and flexibility of your shoulder. These exercises also help to relieve pain, numbness, and tingling. These exercises are done using your healthy shoulder to help stretch the muscles of your injured shoulder. Exercise E: Corner Stretch (External Rotation and Abduction)   1. Stand in a doorway with one of your feet slightly in front of the other. This is called a staggered stance. If you cannot reach your forearms to the door frame, stand facing a corner of a room. 2. Choose one of the following positions as told by your health care provider:  Place your hands and forearms on the door frame above your head.  Place your hands and forearms on the door frame at the height of your head.  Place your hands on the door frame at the height of your elbows. 3. Slowly move your weight onto your front foot until you feel a stretch across your chest and in the front of your shoulders. Keep your head and chest upright and keep your abdominal muscles tight. 4. Hold for __________ seconds. 5. To release the stretch, shift your weight to your back foot. Repeat __________ times. Complete this stretch __________ times a day. Exercise F:Extension, Standing  1. Stand and hold a broomstick, a cane, or a similar object behind your back.  Your hands should be a little wider than shoulder-width apart.  Your palms should face away from your back. 2. Keeping your elbows straight and keeping your shoulder muscles relaxed, move the stick away from your body until you feel a stretch in your shoulder.  Avoid shrugging your shoulders while you move the stick. Keep your shoulder blade tucked down toward the middle of your back. 3. Hold for __________ seconds. 4. Slowly return to the starting position. Repeat __________ times. Complete this exercise __________ times a  day. STRENGTHENING EXERCISES  These exercises build strength and endurance in your shoulder. Endurance is the ability to use your muscles for a long time, even after they get tired. Exercise G:External Rotation   1. Sit in a stable chair without armrests. 2. Secure an exercise band at elbow height on your left / right side. 3. Place a soft object, such as a folded towel or a small pillow, between your left / right upper arm and your body to move your elbow a few inches away (about 10 cm) from your side. 4. Hold the end of the band so it is tight and there is no slack. 5. Keeping your elbow pressed against the soft object, move your left / right forearm out, away from your abdomen. Keep your body steady so only your forearm moves. 6. Hold for __________ seconds. 7. Slowly return to the starting position. Repeat __________ times. Complete this exercise __________ times a day. Exercise H:Shoulder Abduction   1. Sit in a stable chair without armrests, or stand. 2. Hold a __________ weight in your left /   right hand, or hold an exercise band with both hands. 3. Start with your arms straight down and your left / right palm facing in, toward your body. 4. Slowly lift your left / right hand out to your side. Do not lift your hand above shoulder height unless your health care provider tells you that this is safe.  Keep your arms straight.  Avoid shrugging your shoulder while you do this movement. Keep your shoulder blade tucked down toward the middle of your back. 5. Hold for __________ seconds. 6. Slowly lower your arm, and return to the starting position. Repeat __________ times. Complete this exercise __________ times a day. Exercise I:Shoulder Extension  1. Sit in a stable chair without armrests, or stand. 2. Secure an exercise band to a stable object in front of you where it is at shoulder height. 3. Hold one end of the exercise band in each hand. Your palms should face each  other. 4. Straighten your elbows and lift your hands up to shoulder height. 5. Step back, away from the secured end of the exercise band, until the band is tight and there is no slack. 6. Squeeze your shoulder blades together as you pull your hands down to the sides of your thighs. Stop when your hands are straight down by your sides. Do not let your hands go behind your body. 7. Hold for __________ seconds. 8. Slowly return to the starting position. Repeat __________ times. Complete this exercise __________ times a day. Exercise J:Standing Shoulder Row  1. Sit in a stable chair without armrests, or stand. 2. Secure an exercise band to a stable object in front of you so it is at waist height. 3. Hold one end of the exercise band in each hand. Your palms should be in a thumbs-up position. 4. Bend each of your elbows to an "L" shape (about 90 degrees) and keep your upper arms at your sides. 5. Step back until the band is tight and there is no slack. 6. Slowly pull your elbows back behind you. 7. Hold for __________ seconds. 8. Slowly return to the starting position. Repeat __________ times. Complete this exercise __________ times a day. Exercise K:Shoulder Press-Ups   1. Sit in a stable chair that has armrests. Sit upright, with your feet flat on the floor. 2. Put your hands on the armrests so your elbows are bent and your fingers are pointing forward. Your hands should be about even with the sides of your body. 3. Push down on the armrests and use your arms to lift yourself off of the chair. Straighten your elbows and lift yourself up as much as you comfortably can.  Move your shoulder blades down, and avoid letting your shoulders move up toward your ears.  Keep your feet on the ground. As you get stronger, your feet should support less of your body weight as you lift yourself up. 4. Hold for __________ seconds. 5. Slowly lower yourself back into the chair. Repeat __________ times.  Complete this exercise __________ times a day. Exercise L: Wall Push-Ups   1. Stand so you are facing a stable wall. Your feet should be about one arm-length away from the wall. 2. Lean forward and place your palms on the wall at shoulder height. 3. Keep your feet flat on the floor as you bend your elbows and lean forward toward the wall. 4. Hold for __________ seconds. 5. Straighten your elbows to push yourself back to the starting position. Repeat __________ times. Complete   this exercise __________ times a day. This information is not intended to replace advice given to you by your health care provider. Make sure you discuss any questions you have with your health care provider. Document Released: 06/23/2005 Document Revised: 05/03/2016 Document Reviewed: 04/20/2015 [employer] Interactive Patient Education  2017 [employer]  

## 2017-04-05 ENCOUNTER — Ambulatory Visit (INDEPENDENT_AMBULATORY_CARE_PROVIDER_SITE_OTHER): Payer: 59 | Admitting: Family Medicine

## 2017-04-05 ENCOUNTER — Encounter: Payer: Self-pay | Admitting: Family Medicine

## 2017-04-05 VITALS — BP 118/68 | HR 65 | Temp 97.5°F | Resp 16 | Ht 68.0 in | Wt 204.4 lb

## 2017-04-05 DIAGNOSIS — C44629 Squamous cell carcinoma of skin of left upper limb, including shoulder: Secondary | ICD-10-CM | POA: Diagnosis not present

## 2017-04-05 DIAGNOSIS — M7541 Impingement syndrome of right shoulder: Secondary | ICD-10-CM

## 2017-04-05 DIAGNOSIS — L821 Other seborrheic keratosis: Secondary | ICD-10-CM | POA: Diagnosis not present

## 2017-04-05 DIAGNOSIS — D485 Neoplasm of uncertain behavior of skin: Secondary | ICD-10-CM | POA: Diagnosis not present

## 2017-04-05 DIAGNOSIS — L570 Actinic keratosis: Secondary | ICD-10-CM | POA: Diagnosis not present

## 2017-04-05 NOTE — Progress Notes (Signed)
Name: Richard Mueller   MRN: [id number]    DOB: 03-20-1956   Date:04/05/2017       Progress Note  Subjective  Chief Complaint  Chief Complaint  Patient presents with  . Shoulder Pain    rt pt stated that it feels like metal on metal    HPI  Right shoulder: he has intermittent pain on right shoulder for the past year, however a few months ago he picked up a rock that weight just a couple of pounds and threw it to the side of his property and it caused the pain to flare. Pain is only with certain movements. Pain is usually aching, triggered by certain movements and around 6/10, but at times it can be sharp and more intense,it affects his sleep when he rolls over to his right shoulder during the night. He tried some home exercises and symptoms improved some, however during Fall he will be cutting wood and is afraid that it will making it much worse.    Patient Active Problem List   Diagnosis Date Noted  . PVC (premature ventricular contraction) 12/20/2016  . History of squamous cell carcinoma of skin 05/26/2015  . GERD without esophagitis 05/26/2015  . Family history of cardiac disorder 03/17/2015  . Gastro-esophageal reflux disease without esophagitis 03/17/2015  . Obesity (BMI 30-39.9) 03/17/2015  . Osteoarthrosis 03/17/2015  . Perennial allergic rhinitis with seasonal variation 03/17/2015  . Hyperlipidemia   . Hypertension   . Dysmetabolic syndrome 62/95/2841    Past Surgical History:  Procedure Laterality Date  . COLONOSCOPY    . ORTHOPEDIC SURGERY    . SKIN CANCER DESTRUCTION Left 03/06/2015   Hand- Dr. Phillip Heal    Family History  Problem Relation Age of Onset  . Heart disease Mother   . Heart attack Mother   . COPD Mother   . Heart disease Father   . Hypertension Father   . COPD Father   . Cancer Father         Prostate and Skin  . COPD Sister   . Depression Daughter        1  . Heart attack Brother 54       2014    Social History   Social History  . Marital  status: Married    Spouse name: N/A  . Number of children: N/A  . Years of education: N/A   Occupational History  . Not on file.   Social History Main Topics  . Smoking status: Former Smoker    Packs/day: 1.00    Years: 15.00    Types: Cigarettes    Start date: 08/24/1987    Quit date: 08/26/2002  . Smokeless tobacco: Never Used  . Alcohol use 0.0 oz/week     Comment: ocassional  . Drug use: No  . Sexual activity: Yes    Partners: Female    Birth control/ protection: None   Other Topics Concern  . Not on file   Social History Narrative  . No narrative on file     Current Outpatient Prescriptions:  .  Ascorbic Acid (VITAMIN C PO), Take by mouth., Disp: , Rfl:  .  aspirin 81 MG tablet, Take 81 mg by mouth daily., Disp: , Rfl:  .  atorvastatin (LIPITOR) 20 MG tablet, Take 1 tablet (20 mg total) by mouth daily., Disp: 90 tablet, Rfl: 1 .  Calcium 200 MG TABS, Take by mouth., Disp: , Rfl:  .  fexofenadine (ALLEGRA) 180 MG tablet, Take 180 mg  by mouth daily., Disp: , Rfl:  .  IRON PO, Take by mouth., Disp: , Rfl:  .  losartan (COZAAR) 50 MG tablet, Take 1 tablet (50 mg total) by mouth daily., Disp: 90 tablet, Rfl: 1 .  Multiple Vitamin (MULTIVITAMIN) tablet, Take 1 tablet by mouth daily., Disp: , Rfl:   Allergies  Allergen Reactions  . Lisinopril     Rash, pins and needles in arms     ROS  Constitutional: Negative for fever or weight change.  Respiratory: Negative for cough and shortness of breath.   Cardiovascular: Negative for chest pain or palpitations.  Gastrointestinal: Negative for abdominal pain, no bowel changes.  Musculoskeletal: Negative for gait problem or joint swelling.  Skin: Negative for rash.  Neurological: Negative for dizziness or headache.  No other specific complaints in a complete review of systems (except as listed in HPI above).  Objective  Vitals:   04/05/17 1033  BP: 118/68  Pulse: 65  Resp: 16  Temp: (!) 97.5 F (36.4 C)  SpO2: 99%   Weight: 204 lb 6 oz (92.7 kg)  Height: 5\' 8"  (1.727 m)    Body mass index is 31.08 kg/m.  Physical Exam  Constitutional: Patient appears well-developed and well-nourished. Obese  No distress.  HEENT: head atraumatic, normocephalic, pupils equal and reactive to light,neck supple, throat within normal limits Cardiovascular: Normal rate, regular rhythm and normal heart sounds.  No murmur heard. No BLE edema. Pulmonary/Chest: Effort normal and breath sounds normal. No respiratory distress. Abdominal: Soft.  There is no tenderness. Psychiatric: Patient has a normal mood and affect. behavior is normal. Judgment and thought content normal. Muscular Skeletal; pain with abduction and internal rotation of right shoulder, pain during palpation of right acromio-clavicular joint  PHQ2/9: Depression screen [employer] 2/9 12/20/2016 06/22/2016 02/09/2016 10/08/2015 05/26/2015  Decreased Interest 0 0 0 0 0  Down, Depressed, Hopeless 0 0 0 0 0  PHQ - 2 Score 0 0 0 0 0     Fall Risk: Fall Risk  12/20/2016 06/22/2016 02/09/2016 10/08/2015 05/26/2015  Falls in the past year? No No No No No    Assessment & Plan  1. Shoulder impingement, right  - Ambulatory referral to Orthopedic Surgery

## 2017-04-06 DIAGNOSIS — M7541 Impingement syndrome of right shoulder: Secondary | ICD-10-CM | POA: Diagnosis not present

## 2017-04-21 DIAGNOSIS — C44629 Squamous cell carcinoma of skin of left upper limb, including shoulder: Secondary | ICD-10-CM | POA: Diagnosis not present

## 2017-04-26 DIAGNOSIS — L0390 Cellulitis, unspecified: Secondary | ICD-10-CM | POA: Diagnosis not present

## 2017-04-28 DIAGNOSIS — L570 Actinic keratosis: Secondary | ICD-10-CM | POA: Diagnosis not present

## 2017-04-28 DIAGNOSIS — L0390 Cellulitis, unspecified: Secondary | ICD-10-CM | POA: Diagnosis not present

## 2017-04-28 DIAGNOSIS — L821 Other seborrheic keratosis: Secondary | ICD-10-CM | POA: Diagnosis not present

## 2017-05-19 DIAGNOSIS — M7541 Impingement syndrome of right shoulder: Secondary | ICD-10-CM | POA: Diagnosis not present

## 2017-06-01 DIAGNOSIS — R898 Other abnormal findings in specimens from other organs, systems and tissues: Secondary | ICD-10-CM | POA: Diagnosis not present

## 2017-06-01 DIAGNOSIS — L570 Actinic keratosis: Secondary | ICD-10-CM | POA: Diagnosis not present

## 2017-06-14 DIAGNOSIS — M7541 Impingement syndrome of right shoulder: Secondary | ICD-10-CM | POA: Diagnosis not present

## 2017-06-15 ENCOUNTER — Other Ambulatory Visit: Payer: Self-pay | Admitting: Family Medicine

## 2017-06-15 DIAGNOSIS — I10 Essential (primary) hypertension: Secondary | ICD-10-CM

## 2017-06-15 DIAGNOSIS — E782 Mixed hyperlipidemia: Secondary | ICD-10-CM

## 2017-06-21 ENCOUNTER — Ambulatory Visit: Payer: 59 | Admitting: Family Medicine

## 2017-06-21 ENCOUNTER — Ambulatory Visit (INDEPENDENT_AMBULATORY_CARE_PROVIDER_SITE_OTHER): Payer: 59

## 2017-06-21 DIAGNOSIS — Z23 Encounter for immunization: Secondary | ICD-10-CM

## 2017-08-24 DIAGNOSIS — L570 Actinic keratosis: Secondary | ICD-10-CM | POA: Diagnosis not present

## 2017-09-05 DIAGNOSIS — H9209 Otalgia, unspecified ear: Secondary | ICD-10-CM | POA: Diagnosis not present

## 2017-09-05 DIAGNOSIS — H6123 Impacted cerumen, bilateral: Secondary | ICD-10-CM | POA: Diagnosis not present

## 2017-10-19 ENCOUNTER — Telehealth: Payer: Self-pay | Admitting: Family Medicine

## 2017-10-19 NOTE — Telephone Encounter (Unsigned)
Copied from [place] [id number] [id number]. Topic: Quick Communication - See Telephone Encounter >> Oct 19, 2017  8:35 AM Hewitt Shorts wrote: CRM for notification. See Telephone encounter for: pt is calling to check on the status of a insurance for from [place] and to see if it is completed and ready for the patient   10/19/17.  Best number [phone]

## 2017-10-21 NOTE — Telephone Encounter (Signed)
Patient states the forms are for [place] for [employer] and have been received by [place]. They have received all of our notes and Dr. Mack Guise. Christella Scheuermann is just awaiting Dr. Fletcher Anon notes and wondered could you helped speed them up on this process any? I suggest to call them to check on the status.

## 2017-10-21 NOTE — Telephone Encounter (Signed)
Where is the form? Not sure what he needs. I have not seen anything with his name on it

## 2017-10-26 ENCOUNTER — Telehealth: Payer: Self-pay | Admitting: Cardiovascular Disease

## 2017-10-26 NOTE — Telephone Encounter (Signed)
Received medical record request from patient for [employer]   Sent to [employer] via interoffice

## 2017-11-15 ENCOUNTER — Ambulatory Visit: Payer: 59 | Admitting: Family Medicine

## 2017-11-15 ENCOUNTER — Encounter: Payer: Self-pay | Admitting: Family Medicine

## 2017-11-15 VITALS — BP 124/68 | HR 68 | Temp 98.1°F | Resp 18 | Ht 68.0 in | Wt 209.7 lb

## 2017-11-15 DIAGNOSIS — M21932 Unspecified acquired deformity of left forearm: Secondary | ICD-10-CM

## 2017-11-15 DIAGNOSIS — M25522 Pain in left elbow: Secondary | ICD-10-CM | POA: Diagnosis not present

## 2017-11-15 DIAGNOSIS — M79632 Pain in left forearm: Secondary | ICD-10-CM

## 2017-11-15 NOTE — Patient Instructions (Signed)
Please go to [facility] at 3pm today 11/15/2017 for further evaluation.

## 2017-11-15 NOTE — Progress Notes (Signed)
Name: Richard Mueller   MRN: [id number]    DOB: 10/08/1955   Date:11/15/2017       Progress Note  Subjective  Chief Complaint  Chief Complaint  Patient presents with  . Recurrent Skin Infections    on left arm for 1 week, pain that radiates to hand    HPI  Pt presents with concern for lump on the LEFT anterior forearm just distal to the antecubital foss x1 week.  Was taking down some fence wire and reaching up for about 4-5 hours last week, that night/next morning he noticed a lump had formed. He notes pain with supination, pronation, flexion, and worst pain on extension against resistance.  No redness or heat, no exudate or bleeding, no acute trauma aside from possible overuse as described above, no numbness or tingling.  He notes as the day goes on the area becomes more swollen.  Does endorse weakened grip and flexion.  Patient Active Problem List   Diagnosis Date Noted  . PVC (premature ventricular contraction) 12/20/2016  . History of squamous cell carcinoma of skin 05/26/2015  . GERD without esophagitis 05/26/2015  . Family history of cardiac disorder 03/17/2015  . Gastro-esophageal reflux disease without esophagitis 03/17/2015  . Obesity (BMI 30-39.9) 03/17/2015  . Osteoarthrosis 03/17/2015  . Perennial allergic rhinitis with seasonal variation 03/17/2015  . Hyperlipidemia   . Hypertension   . Dysmetabolic syndrome 75/64/3329    Social History   Tobacco Use  . Smoking status: Former Smoker    Packs/day: 1.00    Years: 15.00    Pack years: 15.00    Types: Cigarettes    Start date: 08/24/1987    Last attempt to quit: 08/26/2002    Years since quitting: 15.2  . Smokeless tobacco: Never Used  Substance Use Topics  . Alcohol use: Yes    Alcohol/week: 0.0 oz    Comment: ocassional     Current Outpatient Medications:  .  Ascorbic Acid (VITAMIN C PO), Take by mouth., Disp: , Rfl:  .  aspirin 81 MG tablet, Take 81 mg by mouth daily., Disp: , Rfl:  .  atorvastatin (LIPITOR)  20 MG tablet, TAKE 1 TABLET BY MOUTH EVERY DAY, Disp: 90 tablet, Rfl: 1 .  Calcium 200 MG TABS, Take by mouth., Disp: , Rfl:  .  fexofenadine (ALLEGRA) 180 MG tablet, Take 180 mg by mouth daily., Disp: , Rfl:  .  IRON PO, Take by mouth., Disp: , Rfl:  .  losartan (COZAAR) 50 MG tablet, TAKE 1 TABLET BY MOUTH EVERY DAY, Disp: 90 tablet, Rfl: 1 .  Multiple Vitamin (MULTIVITAMIN) tablet, Take 1 tablet by mouth daily., Disp: , Rfl:   Allergies  Allergen Reactions  . Lisinopril     Rash, pins and needles in arms    ROS  Constitutional: Negative for fever or weight change.  Respiratory: Negative for cough and shortness of breath.   Cardiovascular: Negative for chest pain or palpitations.  Gastrointestinal: Negative for abdominal pain, no bowel changes.  Musculoskeletal: See HPI Skin: Negative for rash.  Neurological: Negative for dizziness or headache.  No other specific complaints in a complete review of systems (except as listed in HPI above).  Objective  Vitals:   11/15/17 0800  BP: 124/68  Pulse: 68  Resp: 18  Temp: 98.1 F (36.7 C)  TempSrc: Oral  SpO2: 98%  Weight: 209 lb 11.2 oz (95.1 kg)  Height: 5\' 8"  (1.727 m)   Body mass index is 31.88 kg/m.  Nursing  Note and Vital Signs reviewed.  Physical Exam  Constitutional: Patient appears well-developed and well-nourished. Obese No distress.  HEENT: head atraumatic, normocephalic Cardiovascular: Normal rate, regular rhythm, S1/S2 present.  No murmur or rub heard. No BLE edema. Pulmonary/Chest: Effort normal and breath sounds clear. No respiratory distress or retractions. Psychiatric: Patient has a normal mood and affect. behavior is normal. Judgment and thought content normal. Musculoskeletal: 3in diameter soft raised lesion just below the LEFT antecubital fossa is present and tender, non-erythematous.  Normal range of motion - however, there is pain with pronation, supination, flexion, and extension - pain with extension  radiated into the distal forearm and the proximal bicep. Weakened grip, weakened strength on extension/flexion against resistance present on LEFT upper extremity.  No other weakness or gross deformities present. Neurological: he is alert and oriented to person, place, and time. No cranial nerve deficit. Coordination, balance, speech and gait are normal.  Strength as above, otherwise WNL. Skin: Skin is warm and dry. No rash noted. No erythema.  Psychiatric: Patient has a normal mood and affect. behavior is normal. Judgment and thought content normal.   No results found for this or any previous visit (from the past 72 hour(s)).  Assessment & Plan  1. Forearm deformity, left - Ambulatory referral to Orthopedic Surgery 2. Left forearm pain - Ambulatory referral to Orthopedic Surgery  [facility] appointment made for 3pm today. Advised may take advil/tylenol PRN until appointment to help with pain.

## 2017-12-10 ENCOUNTER — Other Ambulatory Visit: Payer: Self-pay | Admitting: Family Medicine

## 2017-12-10 DIAGNOSIS — I10 Essential (primary) hypertension: Secondary | ICD-10-CM

## 2017-12-10 DIAGNOSIS — E782 Mixed hyperlipidemia: Secondary | ICD-10-CM

## 2017-12-14 DIAGNOSIS — D1722 Benign lipomatous neoplasm of skin and subcutaneous tissue of left arm: Secondary | ICD-10-CM | POA: Diagnosis not present

## 2017-12-20 ENCOUNTER — Encounter: Payer: Self-pay | Admitting: Cardiovascular Disease

## 2017-12-20 ENCOUNTER — Ambulatory Visit: Payer: 59 | Admitting: Cardiovascular Disease

## 2017-12-20 VITALS — BP 112/74 | HR 75 | Ht 70.0 in | Wt 205.5 lb

## 2017-12-20 DIAGNOSIS — Z0000 Encounter for general adult medical examination without abnormal findings: Secondary | ICD-10-CM

## 2017-12-20 DIAGNOSIS — I10 Essential (primary) hypertension: Secondary | ICD-10-CM | POA: Diagnosis not present

## 2017-12-20 DIAGNOSIS — E782 Mixed hyperlipidemia: Secondary | ICD-10-CM

## 2017-12-20 DIAGNOSIS — E785 Hyperlipidemia, unspecified: Secondary | ICD-10-CM | POA: Diagnosis not present

## 2017-12-20 MED ORDER — ATORVASTATIN CALCIUM 20 MG PO TABS: 20.0000 mg | ORAL_TABLET | Freq: Every day | ORAL | 1 refills | Status: DC

## 2017-12-20 MED ORDER — LOSARTAN POTASSIUM 50 MG PO TABS: 50.0000 mg | ORAL_TABLET | Freq: Every day | ORAL | 1 refills | Status: DC

## 2017-12-20 NOTE — Patient Instructions (Addendum)
Medication Instructions:  Please continue your current medications.  Labwork: CBC, BMET, liver, lipids, PSA  Testing/Procedures: Your doctor has ordered a CT Calcium Score. This test is performed in our [place] office and the cost is $150. Please call [phone] to schedule this at your convenience. [address], [place].  Follow-Up: Your physician wants you to follow-up in: 1 year.  You will receive a reminder letter in the mail two months in advance. If you don't receive a letter, please call our office to schedule the follow-up appointment.  If you need a refill on your cardiac medications before your next appointment, please call your pharmacy.   Coronary Calcium Scan A coronary calcium scan is an imaging test used to look for deposits of calcium and other fatty materials (plaques) in the inner lining of the blood vessels of the heart (coronary arteries). These deposits of calcium and plaques can partly clog and narrow the coronary arteries without producing any symptoms or warning signs. This puts a person at risk for a heart attack. This test can detect these deposits before symptoms develop. Tell a health care provider about:  Any allergies you have.  All medicines you are taking, including vitamins, herbs, eye drops, creams, and over-the-counter medicines.  Any problems you or family members have had with anesthetic medicines.  Any blood disorders you have.  Any surgeries you have had.  Any medical conditions you have.  Whether you are pregnant or may be pregnant. What are the risks? Generally, this is a safe procedure. However, problems may occur, including:  Harm to a pregnant woman and her unborn baby. This test involves the use of radiation. Radiation exposure can be dangerous to a pregnant woman and her unborn baby. If you are pregnant, you generally should not have this procedure done.  Slight increase in the risk of cancer. This is  because of the radiation involved in the test.  What happens before the procedure? No preparation is needed for this procedure. What happens during the procedure?  You will undress and remove any jewelry around your neck or chest.  You will put on a hospital gown.  Sticky electrodes will be placed on your chest. The electrodes will be connected to an electrocardiogram (ECG) machine to record a tracing of the electrical activity of your heart.  A CT scanner will take pictures of your heart. During this time, you will be asked to lie still and hold your breath for 2-3 seconds while a picture of your heart is being taken. The procedure may vary among health care providers and hospitals. What happens after the procedure?  You can get dressed.  You can return to your normal activities.  It is up to you to get the results of your test. Ask your health care provider, or the department that is doing the test, when your results will be ready. Summary  A coronary calcium scan is an imaging test used to look for deposits of calcium and other fatty materials (plaques) in the inner lining of the blood vessels of the heart (coronary arteries).  Generally, this is a safe procedure. Tell your health care provider if you are pregnant or may be pregnant.  No preparation is needed for this procedure.  A CT scanner will take pictures of your heart.  You can return to your normal activities after the scan is done. This information is not intended to replace advice given to you by your health care provider. Make sure  you discuss any questions you have with your health care provider. Document Released: 02/05/2008 Document Revised: 06/28/2016 Document Reviewed: 06/28/2016 Elsevier Interactive Patient Education  2017 [employer].

## 2017-12-20 NOTE — Progress Notes (Signed)
Cardiology Office Note   Date:  12/20/2017   ID:  Richard Mueller, DOB 1955/10/25, MRN [id number]  PCP:  Steele Sizer, MD  Cardiologist:   Kathlyn Sacramento, MD   Chief Complaint  Patient presents with  . Other    12 month follow up. patient denies chest pain and SOB at this time. Meds reviewed verbally with patient.      History of Present Illness: Richard Mueller is a 62 y.o. male who presents for a followup visit regarding hypertension and hyperlipidemia. He has no previous cardiac history. He does have a family history of CAD. He was evaluated in 2014 for atypical chest pain. He underwent a treadmill nuclear stress test which showed no evidence of ischemia with normal ejection fraction.   he has been taking his medications regularly and denies any chest pain, shortness of breath or palpitations.  No side effects.   Past Medical History:  Diagnosis Date  . Allergic rhinitis, cause unspecified   . Allergy   . Bronchitis   . Esophageal reflux   . GERD (gastroesophageal reflux disease)   . Hyperlipidemia   . Hypertension   . Metabolic syndrome   . Obesity, unspecified   . Osteoarthritis   . Osteoarthrosis, unspecified whether generalized or localized, unspecified site   . Right upper quadrant pain     Past Surgical History:  Procedure Laterality Date  . COLONOSCOPY    . ORTHOPEDIC SURGERY    . SKIN CANCER DESTRUCTION Left 03/06/2015   Hand- Dr. Phillip Heal     Current Outpatient Medications  Medication Sig Dispense Refill  . aspirin 81 MG tablet Take 81 mg by mouth daily.    Marland Kitchen atorvastatin (LIPITOR) 20 MG tablet TAKE 1 TABLET BY MOUTH EVERY DAY 90 tablet 1  . Calcium 200 MG TABS Take by mouth.    . fexofenadine (ALLEGRA) 180 MG tablet Take 180 mg by mouth daily.    . IRON PO Take by mouth.    . losartan (COZAAR) 50 MG tablet TAKE 1 TABLET BY MOUTH EVERY DAY 90 tablet 1  . Multiple Vitamin (MULTIVITAMIN) tablet Take 1 tablet by mouth daily.    . Omega-3 Fatty Acids  (FISH OIL) 1000 MG CAPS Take by mouth.     No current facility-administered medications for this visit.     Allergies:   Lisinopril    Social History:  The patient  reports that he quit smoking about 15 years ago. His smoking use included cigarettes. He started smoking about 30 years ago. He has a 15.00 pack-year smoking history. He has never used smokeless tobacco. He reports that he drinks alcohol. He reports that he does not use drugs.   Family History:  The patient's family history includes COPD in his father, mother, and sister; Cancer in his father; Depression in his daughter; Heart attack in his mother; Heart attack (age of onset: 2) in his brother; Heart disease in his father and mother; Hypertension in his father.    ROS:  Please see the history of present illness.   Otherwise, review of systems are positive for none.   All other systems are reviewed and negative.    PHYSICAL EXAM: VS:  BP 112/74 (BP Location: Left Arm, Patient Position: Sitting, Cuff Size: Normal)   Pulse 75   Ht 5\' 10"  (1.778 m)   Wt 205 lb 8 oz (93.2 kg)   BMI 29.49 kg/m  , BMI Body mass index is 29.49 kg/m. GEN: Well nourished, well developed,  in no acute distress  HEENT: normal  Neck: no JVD, carotid bruits, or masses Cardiac: RRR; no murmurs, rubs, or gallops,no edema  Respiratory:  clear to auscultation bilaterally, normal work of breathing GI: soft, nontender, nondistended, + BS MS: no deformity or atrophy  Skin: warm and dry, no rash Neuro:  Strength and sensation are intact Psych: euthymic mood, full affect   EKG:  EKG is ordered today. The ekg ordered today demonstrates normal sinus rhythm with no significant ST or T wave changes.   Recent Labs: No results found for requested labs within last 8760 hours.    Lipid Panel    Component Value Date/Time   CHOL 151 07/01/2016 0804   CHOL 147 05/26/2015 0816   TRIG 93 07/01/2016 0804   TRIG 108 02/27/2013 1609   HDL 47 07/01/2016 0804     HDL 43 05/26/2015 0816   HDL 42 02/27/2013 1609   CHOLHDL 3.2 07/01/2016 0804   VLDL 19 07/01/2016 0804   LDLCALC 85 07/01/2016 0804   LDLCALC 81 05/26/2015 0816   LDLCALC 76 02/27/2013 1609      Wt Readings from Last 3 Encounters:  12/20/17 205 lb 8 oz (93.2 kg)  11/15/17 209 lb 11.2 oz (95.1 kg)  04/05/17 204 lb 6 oz (92.7 kg)      Other studies Reviewed: Additional studies/ records that were reviewed today include: Recent labs. Review of the above records demonstrates: Listed above.   ASSESSMENT AND PLAN:  1.  Hyperlipidemia: Continue treatment with atorvastatin.  No recent labs.  I requested lipid and liver profile.  He does have family history of premature coronary artery disease and thus I recommended proceeding with CT calcium scoring to see if we need to be more aggressive with management of hyperlipidemia.  2. Essential hypertension: Blood pressure is well controlled on current medications.  3.  Health maintenance: The patient is due for routine labs and these were ordered today.   Disposition:   FU with me in 1 year  Signed,  Kathlyn Sacramento, MD  12/20/2017 2:55 PM    [place]

## 2017-12-21 LAB — CBC WITH DIFFERENTIAL/PLATELET
Basophils Absolute: 0 10*3/uL (ref 0.0–0.2)
Basos: 0 %
EOS (ABSOLUTE): 0.1 10*3/uL (ref 0.0–0.4)
EOS: 1 %
HEMATOCRIT: 39.9 % (ref 37.5–51.0)
Hemoglobin: 13.5 g/dL (ref 13.0–17.7)
Immature Grans (Abs): 0 10*3/uL (ref 0.0–0.1)
Immature Granulocytes: 0 %
LYMPHS ABS: 2.2 10*3/uL (ref 0.7–3.1)
Lymphs: 38 %
MCH: 29.8 pg (ref 26.6–33.0)
MCHC: 33.8 g/dL (ref 31.5–35.7)
MCV: 88 fL (ref 79–97)
Monocytes Absolute: 0.8 10*3/uL (ref 0.1–0.9)
Monocytes: 14 %
NEUTROS ABS: 2.8 10*3/uL (ref 1.4–7.0)
Neutrophils: 47 %
Platelets: 243 10*3/uL (ref 150–379)
RBC: 4.53 x10E6/uL (ref 4.14–5.80)
RDW: 14.7 % (ref 12.3–15.4)
WBC: 5.8 10*3/uL (ref 3.4–10.8)

## 2017-12-21 LAB — BASIC METABOLIC PANEL
BUN/Creatinine Ratio: 13 (ref 10–24)
BUN: 13 mg/dL (ref 8–27)
CO2: 23 mmol/L (ref 20–29)
CREATININE: 1.02 mg/dL (ref 0.76–1.27)
Calcium: 9.1 mg/dL (ref 8.6–10.2)
Chloride: 99 mmol/L (ref 96–106)
GFR calc Af Amer: 91 mL/min/{1.73_m2} (ref >59)
GFR, EST NON AFRICAN AMERICAN: 78 mL/min/{1.73_m2} (ref >59)
Glucose: 94 mg/dL (ref 65–99)
POTASSIUM: 4.4 mmol/L (ref 3.5–5.2)
Sodium: 138 mmol/L (ref 134–144)

## 2017-12-21 LAB — LIPID PANEL
Chol/HDL Ratio: 2.6 ratio (ref 0.0–5.0)
Cholesterol, Total: 147 mg/dL (ref 100–199)
HDL: 56 mg/dL (ref >39)
LDL Calculated: 74 mg/dL (ref 0–99)
TRIGLYCERIDES: 86 mg/dL (ref 0–149)
VLDL Cholesterol Cal: 17 mg/dL (ref 5–40)

## 2017-12-21 LAB — PSA: PROSTATE SPECIFIC AG, SERUM: 1.6 ng/mL (ref 0.0–4.0)

## 2017-12-21 LAB — HEPATIC FUNCTION PANEL
ALK PHOS: 59 IU/L (ref 39–117)
ALT: 17 IU/L (ref 0–44)
AST: 20 IU/L (ref 0–40)
Albumin: 4.5 g/dL (ref 3.6–4.8)
Bilirubin Total: 0.4 mg/dL (ref 0.0–1.2)
Bilirubin, Direct: 0.13 mg/dL (ref 0.00–0.40)
Total Protein: 6.9 g/dL (ref 6.0–8.5)

## 2018-01-02 DIAGNOSIS — Z859 Personal history of malignant neoplasm, unspecified: Secondary | ICD-10-CM | POA: Diagnosis not present

## 2018-01-02 DIAGNOSIS — L578 Other skin changes due to chronic exposure to nonionizing radiation: Secondary | ICD-10-CM | POA: Diagnosis not present

## 2018-01-02 DIAGNOSIS — Z872 Personal history of diseases of the skin and subcutaneous tissue: Secondary | ICD-10-CM | POA: Diagnosis not present

## 2018-01-02 DIAGNOSIS — L570 Actinic keratosis: Secondary | ICD-10-CM | POA: Diagnosis not present

## 2018-03-08 DIAGNOSIS — H6010 Cellulitis of external ear, unspecified ear: Secondary | ICD-10-CM | POA: Diagnosis not present

## 2018-03-08 DIAGNOSIS — H60339 Swimmer's ear, unspecified ear: Secondary | ICD-10-CM | POA: Diagnosis not present

## 2018-03-08 DIAGNOSIS — H6121 Impacted cerumen, right ear: Secondary | ICD-10-CM | POA: Diagnosis not present

## 2018-03-10 DIAGNOSIS — H60339 Swimmer's ear, unspecified ear: Secondary | ICD-10-CM | POA: Diagnosis not present

## 2018-03-10 DIAGNOSIS — H6010 Cellulitis of external ear, unspecified ear: Secondary | ICD-10-CM | POA: Diagnosis not present

## 2018-03-21 DIAGNOSIS — H60339 Swimmer's ear, unspecified ear: Secondary | ICD-10-CM | POA: Diagnosis not present

## 2018-03-21 DIAGNOSIS — H6010 Cellulitis of external ear, unspecified ear: Secondary | ICD-10-CM | POA: Diagnosis not present

## 2018-04-03 DIAGNOSIS — L570 Actinic keratosis: Secondary | ICD-10-CM | POA: Diagnosis not present

## 2018-08-10 DIAGNOSIS — M7061 Trochanteric bursitis, right hip: Secondary | ICD-10-CM | POA: Diagnosis not present

## 2018-08-10 DIAGNOSIS — M25551 Pain in right hip: Secondary | ICD-10-CM | POA: Diagnosis not present

## 2018-08-23 DIAGNOSIS — C801 Malignant (primary) neoplasm, unspecified: Secondary | ICD-10-CM

## 2018-08-23 HISTORY — DX: Malignant (primary) neoplasm, unspecified: C80.1

## 2018-08-23 HISTORY — PX: SKIN CANCER EXCISION: SHX779

## 2018-08-30 DIAGNOSIS — D485 Neoplasm of uncertain behavior of skin: Secondary | ICD-10-CM | POA: Diagnosis not present

## 2018-08-30 DIAGNOSIS — C44629 Squamous cell carcinoma of skin of left upper limb, including shoulder: Secondary | ICD-10-CM | POA: Diagnosis not present

## 2018-08-30 DIAGNOSIS — L821 Other seborrheic keratosis: Secondary | ICD-10-CM | POA: Diagnosis not present

## 2018-08-30 DIAGNOSIS — L814 Other melanin hyperpigmentation: Secondary | ICD-10-CM | POA: Diagnosis not present

## 2018-08-30 DIAGNOSIS — L570 Actinic keratosis: Secondary | ICD-10-CM | POA: Diagnosis not present

## 2018-10-04 ENCOUNTER — Other Ambulatory Visit: Payer: Self-pay | Admitting: Cardiovascular Disease

## 2018-10-04 DIAGNOSIS — I10 Essential (primary) hypertension: Secondary | ICD-10-CM

## 2018-10-05 DIAGNOSIS — C44629 Squamous cell carcinoma of skin of left upper limb, including shoulder: Secondary | ICD-10-CM | POA: Diagnosis not present

## 2018-10-30 ENCOUNTER — Telehealth: Payer: Self-pay | Admitting: Family Medicine

## 2018-10-30 ENCOUNTER — Other Ambulatory Visit: Payer: Self-pay | Admitting: Family Medicine

## 2018-10-30 MED ORDER — AZITHROMYCIN 250 MG PO TABS: ORAL_TABLET | ORAL | 0 refills | Status: DC

## 2018-10-30 NOTE — Telephone Encounter (Unsigned)
Copied from [place]. Topic: Quick Communication - Rx Refill/Question >> Oct 30, 2018 12:17 PM Alanda Slim E wrote: Medication: azithromycin (ZITHROMAX) 250 MG tablet - leaving for [place] this Sunday and would like a prescription in case we have problems with the food and or water. The Rx was already sent in for the Pt wife but not for him / please advise   Has the patient contacted their pharmacy? Yes   Preferred Pharmacy (with phone number or street name): [place], [place] [phone] (Phone) [phone] (Fax)    Agent: Please be advised that RX refills may take up to 3 business days. We ask that you follow-up with your pharmacy.

## 2018-10-31 ENCOUNTER — Encounter: Payer: Self-pay | Admitting: Family Medicine

## 2018-11-01 ENCOUNTER — Other Ambulatory Visit: Payer: Self-pay | Admitting: Cardiovascular Disease

## 2018-11-01 ENCOUNTER — Telehealth: Payer: Self-pay | Admitting: Cardiovascular Disease

## 2018-11-01 DIAGNOSIS — E782 Mixed hyperlipidemia: Secondary | ICD-10-CM

## 2018-11-01 NOTE — Telephone Encounter (Signed)
lmov to schedule  °

## 2018-11-01 NOTE — Telephone Encounter (Signed)
-----   Message from Anselm Pancoast, [place] sent at 11/01/2018  2:58 PM EDT ----- Please contact patient for a follow up appointment with Dr. Fletcher Anon.  He is due to come in April 2020.  Thanks, Ivin Booty

## 2018-11-22 HISTORY — PX: APPENDECTOMY: SHX54

## 2018-11-27 ENCOUNTER — Ambulatory Visit: Payer: 59 | Admitting: Family Medicine

## 2018-11-27 ENCOUNTER — Other Ambulatory Visit: Payer: Self-pay

## 2018-11-27 ENCOUNTER — Other Ambulatory Visit
Admission: RE | Admit: 2018-11-27 | Discharge: 2018-11-27 | Disposition: A | Discharge status: Home / Self Care | Payer: 59 | Source: Ambulatory Visit | Attending: Family Medicine | Admitting: Family Medicine

## 2018-11-27 ENCOUNTER — Encounter: Payer: Self-pay | Admitting: Family Medicine

## 2018-11-27 ENCOUNTER — Ambulatory Visit: Payer: Self-pay | Admitting: Family Medicine

## 2018-11-27 VITALS — BP 130/74 | HR 73 | Temp 97.8°F | Resp 16 | Ht 68.0 in | Wt 211.0 lb

## 2018-11-27 DIAGNOSIS — R1031 Right lower quadrant pain: Secondary | ICD-10-CM | POA: Insufficient documentation

## 2018-11-27 DIAGNOSIS — E782 Mixed hyperlipidemia: Secondary | ICD-10-CM | POA: Diagnosis not present

## 2018-11-27 DIAGNOSIS — E8881 Metabolic syndrome: Secondary | ICD-10-CM | POA: Insufficient documentation

## 2018-11-27 LAB — COMPREHENSIVE METABOLIC PANEL
ALT: 18 U/L (ref 0–44)
AST: 19 U/L (ref 15–41)
Albumin: 4.3 g/dL (ref 3.5–5.0)
Alkaline Phosphatase: 57 U/L (ref 38–126)
Anion gap: 10 (ref 5–15)
BUN: 15 mg/dL (ref 8–23)
CO2: 24 mmol/L (ref 22–32)
Calcium: 8.7 mg/dL — ABNORMAL LOW (ref 8.9–10.3)
Chloride: 103 mmol/L (ref 98–111)
Creatinine, Ser: 0.98 mg/dL (ref 0.61–1.24)
GFR calc Af Amer: >60 mL/min (ref >60)
GFR calc non Af Amer: >60 mL/min (ref >60)
Glucose, Bld: 90 mg/dL (ref 70–99)
Potassium: 4 mmol/L (ref 3.5–5.1)
Sodium: 137 mmol/L (ref 135–145)
Total Bilirubin: 0.7 mg/dL (ref 0.3–1.2)
Total Protein: 6.9 g/dL (ref 6.5–8.1)

## 2018-11-27 LAB — LIPID PANEL
Cholesterol: 129 mg/dL (ref 0–200)
HDL: 45 mg/dL (ref >40)
LDL Cholesterol: 70 mg/dL (ref 0–99)
Total CHOL/HDL Ratio: 2.9 RATIO
Triglycerides: 70 mg/dL (ref <150)
VLDL: 14 mg/dL (ref 0–40)

## 2018-11-27 LAB — CBC WITH DIFFERENTIAL/PLATELET
Abs Immature Granulocytes: 0.01 10*3/uL (ref 0.00–0.07)
Basophils Absolute: 0 10*3/uL (ref 0.0–0.1)
Basophils Relative: 1 %
Eosinophils Absolute: 0.2 10*3/uL (ref 0.0–0.5)
Eosinophils Relative: 4 %
HCT: 40.9 % (ref 39.0–52.0)
Hemoglobin: 13.6 g/dL (ref 13.0–17.0)
Immature Granulocytes: 0 %
Lymphocytes Relative: 38 %
Lymphs Abs: 2 10*3/uL (ref 0.7–4.0)
MCH: 30.1 pg (ref 26.0–34.0)
MCHC: 33.3 g/dL (ref 30.0–36.0)
MCV: 90.5 fL (ref 80.0–100.0)
Monocytes Absolute: 0.5 10*3/uL (ref 0.1–1.0)
Monocytes Relative: 10 %
Neutro Abs: 2.5 10*3/uL (ref 1.7–7.7)
Neutrophils Relative %: 47 %
Platelets: 181 10*3/uL (ref 150–400)
RBC: 4.52 MIL/uL (ref 4.22–5.81)
RDW: 12.9 % (ref 11.5–15.5)
WBC: 5.3 10*3/uL (ref 4.0–10.5)
nRBC: 0 % (ref 0.0–0.2)

## 2018-11-27 LAB — HEMOGLOBIN A1C
Hgb A1c MFr Bld: 5.5 % (ref 4.8–5.6)
Mean Plasma Glucose: 111.15 mg/dL

## 2018-11-27 NOTE — Progress Notes (Signed)
Name: Richard Mueller   MRN: [id number]    DOB: 08/08/1956   Date:11/27/2018       Progress Note  Subjective  Chief Complaint  Chief Complaint  Patient presents with  . Abdominal Pain    Onset-yesterday since mid day, Lower Right Quandrant Pain-Constant worst when bending over. Tender and sore when pressure.    HPI  RLQ pain: he states yesterday after lunch he noticed a nagging pain on RUQ. Pain is constant but mild except when applying pressure, it makes it sharp in intensifies the pain. Right now pain at rest is 5-6 . No change in bowel movements, last one this am. No nausea, vomiting. Appetite is normal. No fever or chills. No bulging in the area. No previous hernia surgery or appendectomy. He denies any urinary symptoms.  Pain does not radiate.   Patient Active Problem List   Diagnosis Date Noted  . PVC (premature ventricular contraction) 12/20/2016  . History of squamous cell carcinoma of skin 05/26/2015  . GERD without esophagitis 05/26/2015  . Family history of cardiac disorder 03/17/2015  . Gastro-esophageal reflux disease without esophagitis 03/17/2015  . Obesity (BMI 30-39.9) 03/17/2015  . Osteoarthrosis 03/17/2015  . Perennial allergic rhinitis with seasonal variation 03/17/2015  . Hyperlipidemia   . Hypertension   . Dysmetabolic syndrome 40/03/6760    Past Surgical History:  Procedure Laterality Date  . COLONOSCOPY    . ORTHOPEDIC SURGERY    . SKIN CANCER DESTRUCTION Left 03/06/2015   Hand- Dr. Phillip Heal  . SKIN CANCER EXCISION Left 08/2018   Dr. Phillip Heal on Left Arm     Family History  Problem Relation Age of Onset  . Heart disease Mother   . Heart attack Mother   . COPD Mother   . Heart disease Father   . Hypertension Father   . COPD Father   . Cancer Father         Prostate and Skin  . COPD Sister   . Hypertension Daughter   . Heart attack Brother 54       2014  . Depression Daughter     Social History   Socioeconomic History  . Marital status:  Married    Spouse name: Butch Penny  . Number of children: 2  . Years of education: Not on file  . Highest education level: Some college, no degree  Occupational History  . Not on file  Social Needs  . Financial resource strain: Not hard at all  . Food insecurity:    Worry: Never true    Inability: Never true  . Transportation needs:    Medical: No    Non-medical: No  Tobacco Use  . Smoking status: Former Smoker    Packs/day: 1.00    Years: 15.00    Pack years: 15.00    Types: Cigarettes    Start date: 08/24/1987    Last attempt to quit: 08/26/2002    Years since quitting: 16.2  . Smokeless tobacco: Never Used  Substance and Sexual Activity  . Alcohol use: Yes    Alcohol/week: 0.0 standard drinks    Comment: ocassional  . Drug use: No  . Sexual activity: Yes    Partners: Female    Birth control/protection: None  Lifestyle  . Physical activity:    Days per week: 0 days    Minutes per session: 0 min  . Stress: Only a little  Relationships  . Social connections:    Talks on phone: More than three times a week  Gets together: Once a week    Attends religious service: More than 4 times per year    Active member of club or organization: Yes    Attends meetings of clubs or organizations: More than 4 times per year    Relationship status: Married  . Intimate partner violence:    Fear of current or ex partner: No    Emotionally abused: No    Physically abused: No    Forced sexual activity: No  Other Topics Concern  . Not on file  Social History Narrative  . Not on file     Current Outpatient Medications:  .  Ascorbic Acid (VITAMIN C) 1000 MG tablet, Take 1,000 mg by mouth daily., Disp: , Rfl:  .  aspirin 81 MG tablet, Take 81 mg by mouth daily., Disp: , Rfl:  .  atorvastatin (LIPITOR) 20 MG tablet, TAKE 1 TABLET BY MOUTH DAILY., Disp: 90 tablet, Rfl: 0 .  Calcium 200 MG TABS, Take by mouth., Disp: , Rfl:  .  fexofenadine (ALLEGRA) 180 MG tablet, Take 180 mg by mouth as  needed. , Disp: , Rfl:  .  losartan (COZAAR) 50 MG tablet, TAKE 1 TABLET BY MOUTH DAILY., Disp: 90 tablet, Rfl: 3 .  Multiple Vitamin (MULTIVITAMIN) tablet, Take 1 tablet by mouth daily., Disp: , Rfl:  .  Omega-3 Fatty Acids (FISH OIL) 1000 MG CAPS, Take by mouth., Disp: , Rfl:  .  azithromycin (ZITHROMAX) 250 MG tablet, Take as directed (Patient not taking: Reported on 11/27/2018), Disp: 6 tablet, Rfl: 0 .  IRON PO, Take by mouth., Disp: , Rfl:   Allergies  Allergen Reactions  . Lisinopril     Rash, pins and needles in arms    I personally reviewed active problem list, medication list, allergies, family history, social history with the patient/caregiver today.   ROS  Ten systems reviewed and is negative except as mentioned in HPI   Objective  Vitals:   11/27/18 1520  BP: 130/74  Pulse: 73  Resp: 16  Temp: 97.8 F (36.6 C)  TempSrc: Oral  SpO2: 99%  Weight: 211 lb (95.7 kg)  Height: 5\' 8"  (1.727 m)    Body mass index is 32.08 kg/m.  Physical Exam  Constitutional: Patient appears well-developed and well-nourished. Obese No distress.  HEENT: head atraumatic, normocephalic, pupils equal and reactive to light,  neck supple Cardiovascular: Normal rate, regular rhythm and normal heart sounds.  No murmur heard. No BLE edema. Pulmonary/Chest: Effort normal and breath sounds normal. No respiratory distress. Abdominal: Soft.  There is voluntary guarding, increase bowel sounds - he just ate -  Tenderness during palpation of RLQ, also pain on RLQ with palpation of left side of abdomen  Psychiatric: Patient has a normal mood and affect. behavior is normal. Judgment and thought content normal.  PHQ2/9: Depression screen [facility] 2/9 11/27/2018 12/20/2016 06/22/2016 02/09/2016 10/08/2015  Decreased Interest 0 0 0 0 0  Down, Depressed, Hopeless 0 0 0 0 0  PHQ - 2 Score 0 0 0 0 0  Altered sleeping 0 - - - -  Tired, decreased energy 0 - - - -  Change in appetite 0 - - - -  Feeling bad or  failure about yourself  0 - - - -  Trouble concentrating 0 - - - -  Moving slowly or fidgety/restless 0 - - - -  Suicidal thoughts 0 - - - -  PHQ-9 Score 0 - - - -  Difficult doing work/chores Not  difficult at all - - - -    phq 9 is negative   Fall Risk: Fall Risk  11/27/2018 12/20/2016 06/22/2016 02/09/2016 10/08/2015  Falls in the past year? 0 No No No No  Number falls in past yr: 0 - - - -  Injury with Fall? 0 - - - -     Functional Status Survey: Is the patient deaf or have difficulty hearing?: No Does the patient have difficulty seeing, even when wearing glasses/contacts?: Yes Does the patient have difficulty concentrating, remembering, or making decisions?: No Does the patient have difficulty walking or climbing stairs?: No Does the patient have difficulty dressing or bathing?: No Does the patient have difficulty doing errands alone such as visiting a doctor's office or shopping?: No   Assessment & Plan  1. Right lower quadrant abdominal pain  - CT ABDOMEN PELVIS W CONTRAST; Future - CBC with Differential/Platelet - Comprehensive metabolic panel  2. Mixed hyperlipidemia  - Lipid panel  3. Dysmetabolic syndrome  - Hemoglobin A1c

## 2018-11-27 NOTE — Telephone Encounter (Signed)
Pt called in c/o right lower abd pain that started yesterday morning.   No other symptoms with it.   I let him know Dr. Ancil Boozer office would be contacting him to set up a video chat/phone call visit.  I verified his phone number and e mail.   He was agreeable to this plan.   I let him know the office opens at 8:00.  I sent these notes to the office for further disposition and scheduling.    Reason for Disposition . [1] MILD-MODERATE pain AND [2] constant AND [3] present > 2 hours  Answer Assessment - Initial Assessment Questions 1. LOCATION: "Where does it hurt?"      The lower right abd.  It's not a constant pain but it's a linger nagging pain. 2. RADIATION: "Does the pain shoot anywhere else?" (e.g., chest, back)     No    3. ONSET: "When did the pain begin?" (Minutes, hours or days ago)      Yesterday morning.   4. SUDDEN: "Gradual or sudden onset?"     It was gradual dull pain.   We were planting yesterday.   When I bend over it hurts worse.   5. PATTERN "Does the pain come and go, or is it constant?"    - If constant: "Is it getting better, staying the same, or worsening?"      (Note: Constant means the pain never goes away completely; most serious pain is constant and it progresses)     - If intermittent: "How long does it last?" "Do you have pain now?"     (Note: Intermittent means the pain goes away completely between bouts)     See above.   When my wife presses over that area it's painful. 6. SEVERITY: "How bad is the pain?"  (e.g., Scale 1-10; mild, moderate, or severe)    - MILD (1-3): doesn't interfere with normal activities, abdomen soft and not tender to touch     - MODERATE (4-7): interferes with normal activities or awakens from sleep, tender to touch     - SEVERE (8-10): excruciating pain, doubled over, unable to do any normal activities       I still have my appendix.   6 on pain scale.   Doesn't interfere with activities.   No nausea, vomiting or diarrhea. 7.  RECURRENT SYMPTOM: "Have you ever had this type of abdominal pain before?" If so, ask: "When was the last time?" and "What happened that time?"      No 8. CAUSE: "What do you think is causing the abdominal pain?"     My wife thinks maybe my appendix. 9. RELIEVING/AGGRAVATING FACTORS: "What makes it better or worse?" (e.g., movement, antacids, bowel movement)     Bending over makes it worse.   Not taken any medication for it. 10. OTHER SYMPTOMS: "Has there been any vomiting, diarrhea, constipation, or urine problems?"       No  Protocols used: ABDOMINAL PAIN - MALE-A-AH

## 2018-11-28 ENCOUNTER — Telehealth: Payer: Self-pay | Admitting: Family Medicine

## 2018-11-28 ENCOUNTER — Ambulatory Visit: Payer: 59 | Admitting: Anesthesiology

## 2018-11-28 ENCOUNTER — Ambulatory Visit: Payer: 59 | Admitting: Family Medicine

## 2018-11-28 ENCOUNTER — Other Ambulatory Visit: Payer: Self-pay

## 2018-11-28 ENCOUNTER — Ambulatory Visit
Admission: RE | Admit: 2018-11-28 | Discharge: 2018-11-28 | Disposition: A | Discharge status: Home / Self Care | Payer: 59 | Attending: Surgery | Admitting: Surgery

## 2018-11-28 ENCOUNTER — Encounter: Payer: Self-pay | Admitting: *Deleted

## 2018-11-28 ENCOUNTER — Encounter
Admission: RE | Disposition: A | Discharge status: Home / Self Care | Payer: Self-pay | Source: Home / Self Care | Attending: Surgery

## 2018-11-28 ENCOUNTER — Ambulatory Visit
Admission: RE | Admit: 2018-11-28 | Discharge: 2018-11-28 | Disposition: A | Discharge status: Home / Self Care | Payer: 59 | Source: Ambulatory Visit | Attending: Family Medicine | Admitting: Family Medicine

## 2018-11-28 DIAGNOSIS — K352 Acute appendicitis with generalized peritonitis, without abscess: Secondary | ICD-10-CM | POA: Diagnosis not present

## 2018-11-28 DIAGNOSIS — Z888 Allergy status to other drugs, medicaments and biological substances status: Secondary | ICD-10-CM | POA: Insufficient documentation

## 2018-11-28 DIAGNOSIS — K3589 Other acute appendicitis without perforation or gangrene: Secondary | ICD-10-CM | POA: Diagnosis not present

## 2018-11-28 DIAGNOSIS — I10 Essential (primary) hypertension: Secondary | ICD-10-CM | POA: Diagnosis not present

## 2018-11-28 DIAGNOSIS — K219 Gastro-esophageal reflux disease without esophagitis: Secondary | ICD-10-CM | POA: Insufficient documentation

## 2018-11-28 DIAGNOSIS — E785 Hyperlipidemia, unspecified: Secondary | ICD-10-CM | POA: Insufficient documentation

## 2018-11-28 DIAGNOSIS — K3580 Unspecified acute appendicitis: Secondary | ICD-10-CM

## 2018-11-28 DIAGNOSIS — R1031 Right lower quadrant pain: Secondary | ICD-10-CM

## 2018-11-28 DIAGNOSIS — M1990 Unspecified osteoarthritis, unspecified site: Secondary | ICD-10-CM | POA: Diagnosis not present

## 2018-11-28 DIAGNOSIS — E669 Obesity, unspecified: Secondary | ICD-10-CM | POA: Diagnosis not present

## 2018-11-28 DIAGNOSIS — E8881 Metabolic syndrome: Secondary | ICD-10-CM | POA: Diagnosis not present

## 2018-11-28 DIAGNOSIS — K37 Unspecified appendicitis: Secondary | ICD-10-CM | POA: Diagnosis not present

## 2018-11-28 DIAGNOSIS — Z87891 Personal history of nicotine dependence: Secondary | ICD-10-CM | POA: Insufficient documentation

## 2018-11-28 DIAGNOSIS — Z85828 Personal history of other malignant neoplasm of skin: Secondary | ICD-10-CM | POA: Insufficient documentation

## 2018-11-28 DIAGNOSIS — Z79899 Other long term (current) drug therapy: Secondary | ICD-10-CM | POA: Insufficient documentation

## 2018-11-28 HISTORY — PX: LAPAROSCOPIC APPENDECTOMY: SHX408

## 2018-11-28 SURGERY — APPENDECTOMY, LAPAROSCOPIC
Anesthesia: General

## 2018-11-28 MED ORDER — OXYCODONE HCL 5 MG PO TABS
ORAL_TABLET | ORAL | Status: AC
  Filled 2018-11-28: qty 1

## 2018-11-28 MED ORDER — FAMOTIDINE 20 MG PO TABS
ORAL_TABLET | ORAL | Status: AC
  Administered 2018-11-28: 11:00:00 20 mg via ORAL
  Filled 2018-11-28: qty 1

## 2018-11-28 MED ORDER — CELECOXIB 200 MG PO CAPS
200.0000 mg | ORAL_CAPSULE | ORAL | Status: AC
  Administered 2018-11-28: 11:00:00 200 mg via ORAL

## 2018-11-28 MED ORDER — SUGAMMADEX SODIUM 200 MG/2ML IV SOLN
INTRAVENOUS | Status: AC
  Filled 2018-11-28: qty 2

## 2018-11-28 MED ORDER — BUPIVACAINE-EPINEPHRINE 0.25% -1:200000 IJ SOLN
INTRAMUSCULAR | Status: DC | PRN
  Administered 2018-11-28: 30 mL

## 2018-11-28 MED ORDER — BUPIVACAINE-EPINEPHRINE (PF) 0.25% -1:200000 IJ SOLN
INTRAMUSCULAR | Status: AC
  Filled 2018-11-28: qty 30

## 2018-11-28 MED ORDER — PROMETHAZINE HCL 25 MG/ML IJ SOLN: 6.2500 mg | INTRAMUSCULAR | Status: DC | PRN

## 2018-11-28 MED ORDER — SUCCINYLCHOLINE CHLORIDE 20 MG/ML IJ SOLN
INTRAMUSCULAR | Status: AC
  Filled 2018-11-28: qty 1

## 2018-11-28 MED ORDER — ONDANSETRON HCL 4 MG/2ML IJ SOLN
INTRAMUSCULAR | Status: DC | PRN
  Administered 2018-11-28: 4 mg via INTRAVENOUS

## 2018-11-28 MED ORDER — ROCURONIUM BROMIDE 50 MG/5ML IV SOLN
INTRAVENOUS | Status: AC
  Filled 2018-11-28: qty 1

## 2018-11-28 MED ORDER — OXYCODONE HCL 5 MG PO TABS
5.0000 mg | ORAL_TABLET | Freq: Once | ORAL | Status: AC | PRN
  Administered 2018-11-28: 14:00:00 5 mg via ORAL

## 2018-11-28 MED ORDER — CELECOXIB 200 MG PO CAPS
ORAL_CAPSULE | ORAL | Status: AC
  Administered 2018-11-28: 200 mg via ORAL
  Filled 2018-11-28: qty 1

## 2018-11-28 MED ORDER — FENTANYL CITRATE (PF) 100 MCG/2ML IJ SOLN
INTRAMUSCULAR | Status: AC
  Filled 2018-11-28: qty 2

## 2018-11-28 MED ORDER — PROPOFOL 10 MG/ML IV BOLUS
INTRAVENOUS | Status: DC | PRN
  Administered 2018-11-28: 180 mg via INTRAVENOUS

## 2018-11-28 MED ORDER — LIDOCAINE HCL (CARDIAC) PF 100 MG/5ML IV SOSY
PREFILLED_SYRINGE | INTRAVENOUS | Status: DC | PRN
  Administered 2018-11-28: 100 mg via INTRAVENOUS

## 2018-11-28 MED ORDER — PHENYLEPHRINE HCL 10 MG/ML IJ SOLN
INTRAMUSCULAR | Status: DC | PRN
  Administered 2018-11-28 (×2): 100 ug via INTRAVENOUS

## 2018-11-28 MED ORDER — ROCURONIUM BROMIDE 100 MG/10ML IV SOLN
INTRAVENOUS | Status: DC | PRN
  Administered 2018-11-28: 50 mg via INTRAVENOUS

## 2018-11-28 MED ORDER — PROPOFOL 10 MG/ML IV BOLUS
INTRAVENOUS | Status: AC
  Filled 2018-11-28: qty 20

## 2018-11-28 MED ORDER — GABAPENTIN 300 MG PO CAPS
300.0000 mg | ORAL_CAPSULE | ORAL | Status: AC
  Administered 2018-11-28: 300 mg via ORAL

## 2018-11-28 MED ORDER — CHLORHEXIDINE GLUCONATE CLOTH 2 % EX PADS: 6.0000 | MEDICATED_PAD | Freq: Once | CUTANEOUS | Status: DC

## 2018-11-28 MED ORDER — HYDROCODONE-ACETAMINOPHEN 5-325 MG PO TABS: 1.0000 | ORAL_TABLET | ORAL | 0 refills | Status: DC | PRN

## 2018-11-28 MED ORDER — MIDAZOLAM HCL 2 MG/2ML IJ SOLN
INTRAMUSCULAR | Status: DC | PRN
  Administered 2018-11-28: 2 mg via INTRAVENOUS

## 2018-11-28 MED ORDER — DEXAMETHASONE SODIUM PHOSPHATE 10 MG/ML IJ SOLN
INTRAMUSCULAR | Status: AC
  Filled 2018-11-28: qty 1

## 2018-11-28 MED ORDER — ACETAMINOPHEN 500 MG PO TABS
ORAL_TABLET | ORAL | Status: AC
  Administered 2018-11-28: 11:00:00 1000 mg via ORAL
  Filled 2018-11-28: qty 2

## 2018-11-28 MED ORDER — SODIUM CHLORIDE 0.9 % IV SOLN
1.0000 g | INTRAVENOUS | Status: AC
  Administered 2018-11-28: 12:00:00 1 g via INTRAVENOUS
  Filled 2018-11-28: qty 1

## 2018-11-28 MED ORDER — MIDAZOLAM HCL 2 MG/2ML IJ SOLN
INTRAMUSCULAR | Status: AC
  Filled 2018-11-28: qty 2

## 2018-11-28 MED ORDER — DEXAMETHASONE SODIUM PHOSPHATE 10 MG/ML IJ SOLN
INTRAMUSCULAR | Status: DC | PRN
  Administered 2018-11-28: 10 mg via INTRAVENOUS

## 2018-11-28 MED ORDER — SUCCINYLCHOLINE CHLORIDE 20 MG/ML IJ SOLN
INTRAMUSCULAR | Status: DC | PRN
  Administered 2018-11-28: 100 mg via INTRAVENOUS

## 2018-11-28 MED ORDER — FENTANYL CITRATE (PF) 100 MCG/2ML IJ SOLN
INTRAMUSCULAR | Status: DC | PRN
  Administered 2018-11-28 (×2): 50 ug via INTRAVENOUS

## 2018-11-28 MED ORDER — FENTANYL CITRATE (PF) 100 MCG/2ML IJ SOLN: 25.0000 ug | INTRAMUSCULAR | Status: DC | PRN

## 2018-11-28 MED ORDER — SUGAMMADEX SODIUM 200 MG/2ML IV SOLN
INTRAVENOUS | Status: DC | PRN
  Administered 2018-11-28: 200 mg via INTRAVENOUS

## 2018-11-28 MED ORDER — OXYCODONE HCL 5 MG/5ML PO SOLN: 5.0000 mg | Freq: Once | ORAL | Status: AC | PRN

## 2018-11-28 MED ORDER — LIDOCAINE HCL (PF) 2 % IJ SOLN
INTRAMUSCULAR | Status: AC
  Filled 2018-11-28: qty 10

## 2018-11-28 MED ORDER — IOHEXOL 300 MG/ML  SOLN
100.0000 mL | Freq: Once | INTRAMUSCULAR | Status: AC | PRN
  Administered 2018-11-28: 10:00:00 100 mL via INTRAVENOUS

## 2018-11-28 MED ORDER — ACETAMINOPHEN 500 MG PO TABS
1000.0000 mg | ORAL_TABLET | ORAL | Status: AC
  Administered 2018-11-28: 11:00:00 1000 mg via ORAL

## 2018-11-28 MED ORDER — LACTATED RINGERS IV SOLN
INTRAVENOUS | Status: DC
  Administered 2018-11-28: 12:00:00 via INTRAVENOUS

## 2018-11-28 MED ORDER — ONDANSETRON HCL 4 MG/2ML IJ SOLN
INTRAMUSCULAR | Status: AC
  Filled 2018-11-28: qty 2

## 2018-11-28 MED ORDER — FAMOTIDINE 20 MG PO TABS
20.0000 mg | ORAL_TABLET | Freq: Once | ORAL | Status: AC
  Administered 2018-11-28: 11:00:00 20 mg via ORAL

## 2018-11-28 MED ORDER — MEPERIDINE HCL 50 MG/ML IJ SOLN: 6.2500 mg | INTRAMUSCULAR | Status: DC | PRN

## 2018-11-28 MED ORDER — GABAPENTIN 300 MG PO CAPS
ORAL_CAPSULE | ORAL | Status: AC
  Filled 2018-11-28: qty 1

## 2018-11-28 SURGICAL SUPPLY — 39 items
APPLIER CLIP 5 13 M/L LIGAMAX5 (MISCELLANEOUS)
BLADE CLIPPER SURG (BLADE) ×3 IMPLANT
CANISTER SUCT 1200ML W/VALVE (MISCELLANEOUS) ×3 IMPLANT
CHLORAPREP W/TINT 26 (MISCELLANEOUS) ×3 IMPLANT
CLIP APPLIE 5 13 M/L LIGAMAX5 (MISCELLANEOUS) ×1 IMPLANT
COVER WAND RF STERILE (DRAPES) ×1 IMPLANT
CUTTER FLEX LINEAR 45M (STAPLE) ×3 IMPLANT
DERMABOND ADVANCED (GAUZE/BANDAGES/DRESSINGS) ×2
DERMABOND ADVANCED .7 DNX12 (GAUZE/BANDAGES/DRESSINGS) ×1 IMPLANT
ELECT CAUTERY BLADE 6.4 (BLADE) ×3 IMPLANT
ELECT REM PT RETURN 9FT ADLT (ELECTROSURGICAL) ×3
ELECTRODE REM PT RTRN 9FT ADLT (ELECTROSURGICAL) ×1 IMPLANT
GLOVE BIO SURGEON STRL SZ7 (GLOVE) ×7 IMPLANT
GOWN STRL REUS W/ TWL LRG LVL3 (GOWN DISPOSABLE) ×2 IMPLANT
GOWN STRL REUS W/TWL LRG LVL3 (GOWN DISPOSABLE) ×4
IRRIGATION STRYKERFLOW (MISCELLANEOUS) ×1 IMPLANT
IRRIGATOR STRYKERFLOW (MISCELLANEOUS) ×3
IV NS 1000ML (IV SOLUTION) ×2
IV NS 1000ML BAXH (IV SOLUTION) ×1 IMPLANT
NEEDLE HYPO 22GX1.5 SAFETY (NEEDLE) ×3 IMPLANT
NS IRRIG 500ML POUR BTL (IV SOLUTION) ×3 IMPLANT
PACK LAP CHOLECYSTECTOMY (MISCELLANEOUS) ×3 IMPLANT
PENCIL ELECTRO HAND CTR (MISCELLANEOUS) ×3 IMPLANT
POUCH SPECIMEN RETRIEVAL 10MM (ENDOMECHANICALS) ×3 IMPLANT
RELOAD 45 VASCULAR/THIN (ENDOMECHANICALS) ×3 IMPLANT
RELOAD STAPLE 45 2.5 WHT GRN (ENDOMECHANICALS) ×1 IMPLANT
RELOAD STAPLE 45 3.5 BLU ETS (ENDOMECHANICALS) ×1 IMPLANT
RELOAD STAPLE TA45 3.5 REG BLU (ENDOMECHANICALS) ×3 IMPLANT
SCISSORS METZENBAUM CVD 33 (INSTRUMENTS) ×2 IMPLANT
SHEARS HARMONIC ACE PLUS 36CM (ENDOMECHANICALS) ×3 IMPLANT
SLEEVE ENDOPATH XCEL 5M (ENDOMECHANICALS) ×3 IMPLANT
SPONGE LAP 18X18 RF (DISPOSABLE) ×3 IMPLANT
SUT MNCRL AB 4-0 PS2 18 (SUTURE) ×3 IMPLANT
SUT VICRYL 0 AB UR-6 (SUTURE) ×6 IMPLANT
SYR 20CC LL (SYRINGE) ×3 IMPLANT
TRAY FOLEY MTR SLVR 16FR STAT (SET/KITS/TRAYS/PACK) ×1 IMPLANT
TROCAR XCEL BLUNT TIP 100MML (ENDOMECHANICALS) ×3 IMPLANT
TROCAR XCEL NON-BLD 5MMX100MML (ENDOMECHANICALS) ×4 IMPLANT
TUBING EVAC SMOKE HEATED PNEUM (TUBING) ×3 IMPLANT

## 2018-11-28 NOTE — Transfer of Care (Signed)
Immediate Anesthesia Transfer of Care Note  Patient: Richard Mueller  Procedure(s) Performed: APPENDECTOMY LAPAROSCOPIC (N/A )  Patient Location: PACU  Anesthesia Type:General  Level of Consciousness: awake, alert  and oriented  Airway & Oxygen Therapy: Patient connected to face mask oxygen  Post-op Assessment: Post -op Vital signs reviewed and stable  Post vital signs: stable  Last Vitals:  Vitals Value Taken Time  BP 125/68 11/28/2018  2:05 PM  Temp 36.6 C 11/28/2018  2:04 PM  Pulse 68 11/28/2018  2:07 PM  Resp 14 11/28/2018  2:07 PM  SpO2 95 % 11/28/2018  2:07 PM  Vitals shown include unvalidated device data.  Last Pain:  Vitals:   11/28/18 1404  TempSrc: Temporal  PainSc: 2       Patients Stated Pain Goal: 3 (97/67/34 1937)  Complications: No apparent anesthesia complications

## 2018-11-28 NOTE — Op Note (Signed)
laparascopic appendectomy   [facility] Date of operation:  11/28/2018  Indications: The patient presented with a history of  abdominal pain. Workup has revealed findings consistent with acute appendicitis.  Pre-operative Diagnosis: Acute appendicitis without mention of peritonitis  Post-operative Diagnosis: Same  Surgeon: Caroleen Hamman, MD, FACS  Anesthesia: General with endotracheal tube  Findings: Acute appendicitis non perforated  Estimated Blood Loss: 5cc         Specimens: appendix         Complications:  none  Procedure Details  The patient was seen again in the preop area. The options of surgery versus observation were reviewed with the patient and/or family. The risks of bleeding, infection, recurrence of symptoms, negative laparoscopy, potential for an open procedure, bowel injury, abscess or infection, were all reviewed as well. The patient was taken to Operating Room, identified as [facility] and the procedure verified as laparoscopic appendectomy. A Time Out was held and the above information confirmed.  The patient was placed in the supine position and general anesthesia was induced.  Antibiotic prophylaxis was administered and VT E prophylaxis was in place. The abdomen was prepped and draped in a sterile fashion. An infraumbilical incision was made. A cutdown technique was used to enter the abdominal cavity. Two vicryl stitches were placed on the fascia and a Hasson trocar inserted. Pneumoperitoneum obtained. Two 5 mm ports were placed under direct visualization.   The appendix was identified and found to be acutely inflamed . The appendix was carefully dissected. The mesoappendix was divided withHarmonic scalpel. The base of the appendix was dissected out and divided with a standard load Endo GIA.The appendix was placed in a Endo Catch bag and removed via the Hasson port. The right lower quadrant and pelvis was then irrigated with  normal saline which was aspirated.  Inspection  failed to identify any additional bleeding and there were no signs of bowel injury. Again the right lower quadrant was inspected there was no sign of bleeding or bowel injury therefore pneumoperitoneum was released, all ports were removed.  The umbilical fascia was closed with 0 Vicryl interrupted sutures and the skin incisions were approximated with subcuticular 4-0 Monocryl. Dermabond was placed The patient tolerated the procedure well, there were no complications. The sponge lap and needle count were correct at the end of the procedure.  The patient was taken to the recovery room in stable condition to be admitted for continued care.    Caroleen Hamman, MD FACS

## 2018-11-28 NOTE — Anesthesia Postprocedure Evaluation (Signed)
Anesthesia Post Note  Patient: Richard Mueller  Procedure(s) Performed: APPENDECTOMY LAPAROSCOPIC (N/A )  Patient location during evaluation: PACU Anesthesia Type: General Level of consciousness: awake and alert and oriented Pain management: pain level controlled Vital Signs Assessment: post-procedure vital signs reviewed and stable Respiratory status: spontaneous breathing, nonlabored ventilation and respiratory function stable Cardiovascular status: blood pressure returned to baseline and stable Postop Assessment: no signs of nausea or vomiting Anesthetic complications: no     Last Vitals:  Vitals:   11/28/18 1353 11/28/18 1404  BP: 135/63 125/68  Pulse: 68 71  Resp: 17 16  Temp:  36.6 C  SpO2: 98% 95%    Last Pain:  Vitals:   11/28/18 1404  TempSrc: Temporal  PainSc: 2                  Sequoia Mincey

## 2018-11-28 NOTE — Anesthesia Procedure Notes (Signed)
Procedure Name: Intubation Date/Time: 11/28/2018 12:08 PM Performed by: Aline Brochure, CRNA Pre-anesthesia Checklist: Patient identified, Emergency Drugs available, Suction available and Patient being monitored Patient Re-evaluated:Patient Re-evaluated prior to induction Oxygen Delivery Method: Circle system utilized Preoxygenation: Pre-oxygenation with 100% oxygen Induction Type: IV induction and Rapid sequence Laryngoscope Size: McGraph and 4 Grade View: Grade I Tube type: Oral Tube size: 7.5 mm Number of attempts: 2 Airway Equipment and Method: Stylet and Video-laryngoscopy Placement Confirmation: ETT inserted through vocal cords under direct vision,  positive ETCO2 and breath sounds checked- equal and bilateral Secured at: 22 cm Tube secured with: Tape Dental Injury: Teeth and Oropharynx as per pre-operative assessment

## 2018-11-28 NOTE — Telephone Encounter (Signed)
Patient wife called in to schedule a hosp follow up , but they did not keep him they sent him home after the surgery. H e is doing good , but said that they were glad they got to it when they did. It was very enlarged. Made appt on 12-06-2018

## 2018-11-28 NOTE — Anesthesia Post-op Follow-up Note (Signed)
Anesthesia QCDR form completed.        

## 2018-11-28 NOTE — Discharge Instructions (Addendum)
Laparoscopic Appendectomy  °Care After  ° ° °These instructions give you information on caring for yourself after your procedure. Your doctor may also give you more specific instructions. Call your doctor if you have any problems or questions after your procedure.  °HOME CARE  °Do not drive while taking pain medicine (narcotics).  °Take medicine (stool softener) if you cannot poop (constipated).  °Change your bandages (dressings) as told by your doctor.  °Keep your wounds clean and dry. Wash the wounds gently with soap and water. Gently pat the wounds dry with a clean towel.  °Do not take baths, swim, or use hot tubs for 10 days, or as told by your doctor.  °Only take medicine as told by your doctor.  °Continue your normal diet as told by your doctor.  °Do not lift more than 10 pounds (4.5 kilograms) or play contact sports for 3 weeks, or as told by your doctor.  °Slowly increase your activity.  °Take deep breaths to avoid a lung infection (pneumonia). °GET HELP RIGHT AWAY IF:  °You have a fever >101 °You have a rash.  °You have trouble breathing or sharp chest pain.  °You have a reaction to the medicine you are taking.  °Your wound is red, puffy (swollen), or painful.  °You have yellowish-white fluid (pus) coming from the wound.  °You have fluid coming from the wound for longer than 1 day.  °You notice a bad smell coming from the wound or bandage.  °Your wound breaks open after stitches (sutures) or staples are removed.  °You have pain in the shoulders or shoulder blades.   °You are short of breath.  °You feel sick to your stomach (nauseous) or throw up (vomit).  °MAKE SURE YOU:  °Understand these instructions.  °Will watch your condition.  °Will get help right away if you are not doing well or get worse. ° °AMBULATORY SURGERY  °DISCHARGE INSTRUCTIONS ° ° °1) The drugs that you were given will stay in your system until tomorrow so for the next 24 hours you should not: ° °A) Drive an automobile °B) Make any legal  decisions °C) Drink any alcoholic beverage ° ° °2) You may resume regular meals tomorrow.  Today it is better to start with liquids and gradually work up to solid foods. ° °You may eat anything you prefer, but it is better to start with liquids, then soup and crackers, and gradually work up to solid foods. ° ° °3) Please notify your doctor immediately if you have any unusual bleeding, trouble breathing, redness and pain at the surgery site, drainage, fever, or pain not relieved by medication. ° ° ° °4) Additional Instructions: ° ° ° ° ° ° ° °Please contact your physician with any problems or Same Day Surgery at [phone], Monday through Friday 6 am to 4 pm, or [facility] number at [phone]. ° ° °

## 2018-11-28 NOTE — Anesthesia Preprocedure Evaluation (Signed)
Anesthesia Evaluation  Patient identified by MRN, date of birth, ID band Patient awake    Reviewed: Allergy & Precautions, NPO status , Patient's Chart, lab work & pertinent test results  History of Anesthesia Complications Negative for: history of anesthetic complications  Airway Mallampati: II  TM Distance: >3 FB Neck ROM: Full    Dental  (+) Implants   Pulmonary neg sleep apnea, neg COPD, former smoker,    breath sounds clear to auscultation- rhonchi (-) wheezing      Cardiovascular hypertension, (-) CAD, (-) Past MI, (-) Cardiac Stents and (-) CABG  Rhythm:Regular Rate:Normal - Systolic murmurs and - Diastolic murmurs    Neuro/Psych neg Seizures negative neurological ROS  negative psych ROS   GI/Hepatic Neg liver ROS, GERD  ,  Endo/Other  negative endocrine ROSneg diabetes  Renal/GU negative Renal ROS     Musculoskeletal  (+) Arthritis ,   Abdominal (+) + obese,   Peds  Hematology negative hematology ROS (+)   Anesthesia Other Findings Past Medical History: No date: Allergic rhinitis, cause unspecified No date: Allergy No date: Bronchitis No date: Esophageal reflux No date: GERD (gastroesophageal reflux disease) No date: Hyperlipidemia No date: Hypertension No date: Metabolic syndrome No date: Obesity, unspecified No date: Osteoarthritis No date: Osteoarthrosis, unspecified whether generalized or localized,  unspecified site No date: Right upper quadrant pain   Reproductive/Obstetrics                             Anesthesia Physical Anesthesia Plan  ASA: II  Anesthesia Plan: General   Post-op Pain Management:    Induction: Intravenous  PONV Risk Score and Plan: 1  Airway Management Planned: Oral ETT  Additional Equipment:   Intra-op Plan:   Post-operative Plan: Extubation in OR  Informed Consent: I have reviewed the patients History and Physical, chart, labs  and discussed the procedure including the risks, benefits and alternatives for the proposed anesthesia with the patient or authorized representative who has indicated his/her understanding and acceptance.     Dental advisory given  Plan Discussed with: CRNA and Anesthesiologist  Anesthesia Plan Comments:         Anesthesia Quick Evaluation

## 2018-11-28 NOTE — H&P (Signed)
Patient ID: Richard Mueller, male   DOB: Jan 17, 1956, 63 y.o.   MRN: [id number]  HPI Richard Mueller is a 63 y.o. male seen in consultation at the request of Dr. Ancil Boozer. Case d/w her in detail. 3 day hx of RLQ pain, mild to moderate in intensity. Today worsening. Pain is sharp and now constant, worsening with movement. No fevers or chills. No resp sxs. No exposure to covid 19. Associated nausea and decrease appetite.  HE is able to perform more than 4 mets w/o SOB or c/p. No major cardiac or resp issues. CT scan pers. Reviewed c/w appendicitis, no perf or abscess.  CBC and cmp from yesterday normal. No previous abd operations  HPI  Past Medical History:  Diagnosis Date  . Allergic rhinitis, cause unspecified   . Allergy   . Bronchitis   . Esophageal reflux   . GERD (gastroesophageal reflux disease)   . Hyperlipidemia   . Hypertension   . Metabolic syndrome   . Obesity, unspecified   . Osteoarthritis   . Osteoarthrosis, unspecified whether generalized or localized, unspecified site   . Right upper quadrant pain     Past Surgical History:  Procedure Laterality Date  . COLONOSCOPY    . ORTHOPEDIC SURGERY    . SKIN CANCER DESTRUCTION Left 03/06/2015   Hand- Dr. Phillip Heal  . SKIN CANCER EXCISION Left 08/2018   Dr. Phillip Heal on Left Arm     Family History  Problem Relation Age of Onset  . Heart disease Mother   . Heart attack Mother   . COPD Mother   . Heart disease Father   . Hypertension Father   . COPD Father   . Cancer Father         Prostate and Skin  . COPD Sister   . Hypertension Daughter   . Heart attack Brother 54       2014  . Depression Daughter     Social History Social History   Tobacco Use  . Smoking status: Former Smoker    Packs/day: 1.00    Years: 15.00    Pack years: 15.00    Types: Cigarettes    Start date: 08/24/1987    Last attempt to quit: 08/26/2002    Years since quitting: 16.2  . Smokeless tobacco: Never Used  Substance Use Topics  . Alcohol  use: Yes    Alcohol/week: 0.0 standard drinks    Comment: ocassional  . Drug use: No    Allergies  Allergen Reactions  . Lisinopril     Rash, pins and needles in arms    Current Facility-Administered Medications  Medication Dose Route Frequency Provider Last Rate Last Dose  . Chlorhexidine Gluconate Cloth 2 % PADS 6 each  6 each Topical Once Pabon, Marjory Lies, MD       And  . Chlorhexidine Gluconate Cloth 2 % PADS 6 each  6 each Topical Once Pabon, [place] F, MD      . ertapenem [facility]) 1,000 mg in sodium chloride 0.9 % 100 mL IVPB  1 g Intravenous On Call to OR Pabon, Diego F, MD      . gabapentin (NEURONTIN) 300 MG capsule           . lactated ringers infusion   Intravenous Continuous Penwarden, Amy, MD         Review of Systems Full ROS  was asked and was negative except for the information on the HPI  Physical Exam There were no vitals taken for this  visit. CONSTITUTIONAL: non toxic EYES: Pupils are equal, round, and reactive to light, Sclera are non-icteric. EARS, NOSE, MOUTH AND THROAT: The oropharynx is clear. The oral mucosa is pink and moist. Hearing is intact to voice. LYMPH NODES:  Lymph nodes in the neck are normal. RESPIRATORY:  Lungs are clear. There is normal respiratory effort, with equal breath sounds bilaterally, and without pathologic use of accessory muscles. CARDIOVASCULAR: Heart is regular without murmurs, gallops, or rubs. GI: The abdomen is  soft, TTP right lower quadrant with focal peritonitis and some rebound. There are no palpable masses. There is no hepatosplenomegaly. There are normal bowel sounds Diastasis recti GU: Rectal deferred.   MUSCULOSKELETAL: Normal muscle strength and tone. No cyanosis or edema.   SKIN: Turgor is good and there are no pathologic skin lesions or ulcers. NEUROLOGIC: Motor and sensation is grossly normal. Cranial nerves are grossly intact. PSYCH:  Oriented to person, place and time. Affect is normal.  Data Reviewed  I have  personally reviewed the patient's imaging, laboratory findings and medical records.    Assessment/Plan 63 yo w clinical findings c/w acute appendicitis and confirmed by CT. D/W the pt in detail about options. The risks, benefits, complications, treatment options, and expected outcomes were discussed with the patient. The treatment of antibiotics alone was discussed giving a 20% chance that this could fail and surgery would be necessary.  Also discussed continuing to the operating room for Laparoscopic Appendectomy. Specifically we discuss about the risks of having surgery while we have the COVD 19 epidemic and the risks of lingering in the hospital and increase in LOS if we manage this medically.  The possibilities of  bleeding, recurrent infection, perforation of viscus, finding a normal appendix, the need for additional procedures, failure to diagnose a condition, conversion to open procedure and creating a complication requiring transfusion or further operations were discussed. The patient was given the opportunity to ask questions and have them answered.  Patient would like to proceed with Laparoscopic Appendectomy and consent was obtained. We will fo appendectomy in an urgent fashion. We will try to send him home today given the covid 19 epidemic I have personally d/w our referring provider my thought process and have sent a copy of this report.   Caroleen Hamman, MD FACS General Surgeon 11/28/2018, 11:30 AM

## 2018-11-29 ENCOUNTER — Encounter: Payer: Self-pay | Admitting: Surgery

## 2018-11-30 LAB — SURGICAL PATHOLOGY

## 2018-12-06 ENCOUNTER — Other Ambulatory Visit: Payer: Self-pay

## 2018-12-06 ENCOUNTER — Encounter: Payer: Self-pay | Admitting: Family Medicine

## 2018-12-06 ENCOUNTER — Ambulatory Visit (INDEPENDENT_AMBULATORY_CARE_PROVIDER_SITE_OTHER): Payer: 59 | Admitting: Family Medicine

## 2018-12-06 VITALS — Wt 202.6 lb

## 2018-12-06 DIAGNOSIS — M5136 Other intervertebral disc degeneration, lumbar region: Secondary | ICD-10-CM | POA: Insufficient documentation

## 2018-12-06 DIAGNOSIS — I700 Atherosclerosis of aorta: Secondary | ICD-10-CM | POA: Diagnosis not present

## 2018-12-06 DIAGNOSIS — E782 Mixed hyperlipidemia: Secondary | ICD-10-CM

## 2018-12-06 DIAGNOSIS — D3502 Benign neoplasm of left adrenal gland: Secondary | ICD-10-CM | POA: Insufficient documentation

## 2018-12-06 DIAGNOSIS — I10 Essential (primary) hypertension: Secondary | ICD-10-CM | POA: Diagnosis not present

## 2018-12-06 DIAGNOSIS — E8881 Metabolic syndrome: Secondary | ICD-10-CM | POA: Diagnosis not present

## 2018-12-06 NOTE — Progress Notes (Signed)
Name: Richard Mueller   MRN: [id number]    DOB: Mar 06, 1956   Date:12/06/2018       Progress Note  Subjective  Chief Complaint  Chief Complaint  Patient presents with  . Hospitalization Follow-up    I connected with  Richard Mueller  on 12/06/18 at 10:40 AM EDT by a video enabled telemedicine application and verified that I am speaking with the correct person using two identifiers.  I discussed the limitations of evaluation and management by telemedicine and the availability of in person appointments. The patient expressed understanding and agreed to proceed. Staff also discussed with the patient that there may be a patient responsible charge related to this service. Patient Location: he parked to speak to me from his car Provider Location: [facility] discharge follow up: he was sen in our office on 11/27/2018 with RLQ pain and CT showed appendicitis. He has appendectomy done on 11/28/2018 by Dr. Dahlia Byes. He is doing well, states abdominal soreness only and pain significant better over the past few days. No nausea, vomiting or fever, bowel movements are normal   Hyperlipidemia: taking Atorvastatin, reviewed labs with patient. He denies myalgia or chest pain. Last LDL was 70.  Feeling well  HTN: taking Losartan,  no chest pain or palpitation. Sees Dr. Fletcher Anon - had stress test in 2014 because of atypical chest pain and it was normal, on EKG done by Dr. Fletcher Anon there was PVC sinus rhythm. No side effects of medications  Metabolic Syndrome:  He went to diabetic teaching class in 2018. He has been eating breakfast and lunch, they cook mostly at home, he stopped drinking sweet tea and does not eat after 7 pm .He denies polyphagia, polydipsia and polyuria. He is feeling well.Last hgbA1C was down to 5.5 % , it was 5.9% in the past    Adrenal adenoma: incidental finding on CT done for evaluation of appendicitis , we will repeat CT in 6 months  Atherosclerosis of  aorta: incidental finding on CT abdomen. He is already on statin and aspirin and no claudicication symptoms. Continue current management   Patient Active Problem List   Diagnosis Date Noted  . Acute appendicitis   . PVC (premature ventricular contraction) 12/20/2016  . History of squamous cell carcinoma of skin 05/26/2015  . GERD without esophagitis 05/26/2015  . Family history of cardiac disorder 03/17/2015  . Gastro-esophageal reflux disease without esophagitis 03/17/2015  . Obesity (BMI 30-39.9) 03/17/2015  . Osteoarthrosis 03/17/2015  . Perennial allergic rhinitis with seasonal variation 03/17/2015  . Hyperlipidemia   . Hypertension   . Dysmetabolic syndrome 71/24/5809    Past Surgical History:  Procedure Laterality Date  . COLONOSCOPY    . LAPAROSCOPIC APPENDECTOMY N/A 11/28/2018   Procedure: APPENDECTOMY LAPAROSCOPIC;  Surgeon: Jules Husbands, MD;  Location: [facility] ORS;  Service: General;  Laterality: N/A;  . ORTHOPEDIC SURGERY    . SKIN CANCER DESTRUCTION Left 03/06/2015   Hand- Dr. Phillip Heal  . SKIN CANCER EXCISION Left 08/2018   Dr. Phillip Heal on Left Arm     Family History  Problem Relation Age of Onset  . Heart disease Mother   . Heart attack Mother   . COPD Mother   . Heart disease Father   . Hypertension Father   . COPD Father   . Cancer Father         Prostate and Skin  . COPD Sister   . Hypertension Daughter   . Heart attack  Brother 54       2014  . Depression Daughter     Social History   Socioeconomic History  . Marital status: Married    Spouse name: Butch Penny  . Number of children: 2  . Years of education: Not on file  . Highest education level: Some college, no degree  Occupational History  . Not on file  Social Needs  . Financial resource strain: Not hard at all  . Food insecurity:    Worry: Never true    Inability: Never true  . Transportation needs:    Medical: No    Non-medical: No  Tobacco Use  . Smoking status: Former Smoker    Packs/day:  1.00    Years: 15.00    Pack years: 15.00    Types: Cigarettes    Start date: 08/24/1987    Last attempt to quit: 08/26/2002    Years since quitting: 16.2  . Smokeless tobacco: Never Used  Substance and Sexual Activity  . Alcohol use: Yes    Alcohol/week: 0.0 standard drinks    Comment: ocassional  . Drug use: No  . Sexual activity: Yes    Partners: Female    Birth control/protection: None  Lifestyle  . Physical activity:    Days per week: 0 days    Minutes per session: 0 min  . Stress: Only a little  Relationships  . Social connections:    Talks on phone: More than three times a week    Gets together: Once a week    Attends religious service: More than 4 times per year    Active member of club or organization: Yes    Attends meetings of clubs or organizations: More than 4 times per year    Relationship status: Married  . Intimate partner violence:    Fear of current or ex partner: No    Emotionally abused: No    Physically abused: No    Forced sexual activity: No  Other Topics Concern  . Not on file  Social History Narrative  . Not on file     Current Outpatient Medications:  .  Ascorbic Acid (VITAMIN C) 1000 MG tablet, Take 500 mg by mouth daily. , Disp: , Rfl:  .  aspirin 81 MG tablet, Take 81 mg by mouth daily., Disp: , Rfl:  .  atorvastatin (LIPITOR) 20 MG tablet, TAKE 1 TABLET BY MOUTH DAILY., Disp: 90 tablet, Rfl: 0 .  Calcium 200 MG TABS, Take by mouth., Disp: , Rfl:  .  fexofenadine (ALLEGRA) 180 MG tablet, Take 180 mg by mouth as needed. , Disp: , Rfl:  .  losartan (COZAAR) 50 MG tablet, TAKE 1 TABLET BY MOUTH DAILY., Disp: 90 tablet, Rfl: 3 .  Multiple Vitamin (MULTIVITAMIN) tablet, Take 1 tablet by mouth daily., Disp: , Rfl:  .  Omega-3 Fatty Acids (FISH OIL) 1000 MG CAPS, Take by mouth., Disp: , Rfl:  .  HYDROcodone-acetaminophen (NORCO/VICODIN) 5-325 MG tablet, Take 1-2 tablets by mouth every 4 (four) hours as needed for moderate pain. (Patient not taking:  Reported on 12/06/2018), Disp: 30 tablet, Rfl: 0 .  IRON PO, Take by mouth., Disp: , Rfl:   Allergies  Allergen Reactions  . Lisinopril     Rash, pins and needles in arms    I personally reviewed active problem list, medication list, allergies, family history with the patient/caregiver today.   ROS  Constitutional: Negative for fever or weight change.  Respiratory: Negative for cough and shortness  of breath.   Cardiovascular: Negative for chest pain or palpitations.  Gastrointestinal: positive for abdominal pain but improving since Sunday - just sore , no bowel changes.  Musculoskeletal: Negative for gait problem or joint swelling.  Skin: Negative for rash.  Neurological: Negative for dizziness or headache.  No other specific complaints in a complete review of systems (except as listed in HPI above).  Objective  Virtual encounter, vitals not obtained.  Body mass index is 30.81 kg/m.  Physical Exam  Constitutional: Patient appears well-developed and well-nourished.No distress. Sitting in his car    PHQ2/9: Depression screen [facility] 2/9 12/06/2018 11/27/2018 12/20/2016 06/22/2016 02/09/2016  Decreased Interest 0 0 0 0 0  Down, Depressed, Hopeless 0 0 0 0 0  PHQ - 2 Score 0 0 0 0 0  Altered sleeping 0 0 - - -  Tired, decreased energy 0 0 - - -  Change in appetite 0 0 - - -  Feeling bad or failure about yourself  0 0 - - -  Trouble concentrating 0 0 - - -  Moving slowly or fidgety/restless 0 0 - - -  Suicidal thoughts 0 0 - - -  PHQ-9 Score 0 0 - - -  Difficult doing work/chores - Not difficult at all - - -   PHQ-2/9 Result is negative.    Fall Risk: Fall Risk  12/06/2018 11/27/2018 12/20/2016 06/22/2016 02/09/2016  Falls in the past year? 0 0 No No No  Number falls in past yr: 0 0 - - -  Injury with Fall? 0 0 - - -     Assessment & Plan  1. Mixed hyperlipidemia  Continue dietary changes and statin therapy   2. Dysmetabolic syndrome  Doing well with life style  modification   3. Essential hypertension  Controled with medication   4. Atherosclerosis of aorta (HCC)  No history of claudication, continue statin therapy and daily aspirin   5. Degenerative disc disease, lumbar  Incidental finding on CT abdomen   6. Adenoma of left adrenal gland  - CT ABDOMEN W WO CONTRAST; Future.  I discussed the assessment and treatment plan with the patient. The patient was provided an opportunity to ask questions and all were answered. The patient agreed with the plan and demonstrated an understanding of the instructions.  The patient was advised to call back or seek an in-person evaluation if the symptoms worsen or if the condition fails to improve as anticipated.  I provided 25 minutes of non-face-to-face time during this encounter.

## 2018-12-11 ENCOUNTER — Telehealth (INDEPENDENT_AMBULATORY_CARE_PROVIDER_SITE_OTHER): Payer: 59 | Admitting: Surgery

## 2018-12-11 ENCOUNTER — Other Ambulatory Visit: Payer: Self-pay

## 2018-12-11 DIAGNOSIS — K3589 Other acute appendicitis without perforation or gangrene: Secondary | ICD-10-CM

## 2018-12-11 NOTE — Progress Notes (Signed)
Telemedicine Surgical Follow Up  12/11/2018  Richard Mueller is an 63 y.o. male.   No chief complaint on file.   HPI:  63 year old status post lap appendectomy.  Doing very well.  No pain.  Taking p.o.  No fevers no chills.  Pathology discussed with him in detail.  Also discussed the left adrenal lesion and he does have a follow-up CT in 6 months. Denies any other symptoms Location of the patient:Car stationary Time spent on call: 4 min Total Time spent in the encounter including counseling and coordination of care: 8 min Location of the Provider: office The patient had given consent for a telemedicine visit and they understands the limitations associated with this including but not limited to privacy, cyber security and technology issues. Persons participating in the visit:pt    Past Medical History:  Diagnosis Date  . Allergic rhinitis, cause unspecified   . Allergy   . Bronchitis   . Esophageal reflux   . GERD (gastroesophageal reflux disease)   . Hyperlipidemia   . Hypertension   . Metabolic syndrome   . Obesity, unspecified   . Osteoarthritis   . Osteoarthrosis, unspecified whether generalized or localized, unspecified site   . Right upper quadrant pain     Past Surgical History:  Procedure Laterality Date  . COLONOSCOPY    . LAPAROSCOPIC APPENDECTOMY N/A 11/28/2018   Procedure: APPENDECTOMY LAPAROSCOPIC;  Surgeon: Jules Husbands, MD;  Location: [facility] ORS;  Service: General;  Laterality: N/A;  . ORTHOPEDIC SURGERY    . SKIN CANCER DESTRUCTION Left 03/06/2015   Hand- Dr. Phillip Heal  . SKIN CANCER EXCISION Left 08/2018   Dr. Phillip Heal on Left Arm     Family History  Problem Relation Age of Onset  . Heart disease Mother   . Heart attack Mother   . COPD Mother   . Heart disease Father   . Hypertension Father   . COPD Father   . Cancer Father         Prostate and Skin  . COPD Sister   . Hypertension Daughter   . Heart attack Brother 54       2014  . Depression  Daughter     Social History:  reports that he quit smoking about 16 years ago. His smoking use included cigarettes. He started smoking about 31 years ago. He has a 15.00 pack-year smoking history. He has never used smokeless tobacco. He reports current alcohol use. He reports that he does not use drugs.  Allergies:  Allergies  Allergen Reactions  . Lisinopril     Rash, pins and needles in arms    Medications reviewed.    ROS Full ROS performed and is otherwise negative other than what is stated in HPI   Assessment/Plan: Doing very well after lap appendectomy.  No heavy lifting.  We will see him back in 6 months to evaluate for the left adrenal lesion and we will also evaluate the CT scan at that point. The pt was provided an opportunity to ask questions and all were answered. The pt was advised to call back or seek in-person evaluation if the symptoms worsen or if the condition fails to improve as anticipated. Greater than 50% of the 8 minutes  visit was spent in counseling/coordination of care   Caroleen Hamman, MD [place] Surgeon

## 2018-12-27 ENCOUNTER — Telehealth: Payer: Self-pay

## 2018-12-27 NOTE — Telephone Encounter (Signed)
ERROR

## 2018-12-27 NOTE — Telephone Encounter (Signed)

## 2018-12-28 ENCOUNTER — Telehealth (INDEPENDENT_AMBULATORY_CARE_PROVIDER_SITE_OTHER): Payer: 59 | Admitting: Cardiovascular Disease

## 2018-12-28 ENCOUNTER — Encounter: Payer: Self-pay | Admitting: Cardiovascular Disease

## 2018-12-28 ENCOUNTER — Other Ambulatory Visit: Payer: Self-pay

## 2018-12-28 VITALS — HR 62 | Ht 71.0 in | Wt 202.0 lb

## 2018-12-28 DIAGNOSIS — I10 Essential (primary) hypertension: Secondary | ICD-10-CM | POA: Diagnosis not present

## 2018-12-28 DIAGNOSIS — E782 Mixed hyperlipidemia: Secondary | ICD-10-CM

## 2018-12-28 MED ORDER — LOSARTAN POTASSIUM 50 MG PO TABS: 50.0000 mg | ORAL_TABLET | Freq: Every day | ORAL | 3 refills | Status: DC

## 2018-12-28 MED ORDER — ATORVASTATIN CALCIUM 20 MG PO TABS: 20.0000 mg | ORAL_TABLET | Freq: Every day | ORAL | 3 refills | Status: DC

## 2018-12-28 NOTE — Patient Instructions (Signed)
Medication Instructions:  Continue same medications If you need a refill on your cardiac medications before your next appointment, please call your pharmacy.   Lab work: None If you have labs (blood work) drawn today and your tests are completely normal, you will receive your results only by: Marland Kitchen MyChart Message (if you have MyChart) OR . A paper copy in the mail If you have any lab test that is abnormal or we need to change your treatment, we will call you to review the results.  Testing/Procedures: None  Follow-Up: At [facility], you and your health needs are our priority.  As part of our continuing mission to provide you with exceptional heart care, we have created designated Provider Care Teams.  These Care Teams include your primary Cardiologist (physician) and Advanced Practice Providers (APPs -  Physician Assistants and Nurse Practitioners) who all work together to provide you with the care you need, when you need it. You will need a follow up appointment in 1 years.  '

## 2018-12-28 NOTE — Progress Notes (Signed)
Virtual Visit via Video Note   This visit type was conducted due to national recommendations for restrictions regarding the COVID-19 Pandemic (e.g. social distancing) in an effort to limit this patient's exposure and mitigate transmission in our community.  Due to his co-morbid illnesses, this patient is at least at moderate risk for complications without adequate follow up.  This format is felt to be most appropriate for this patient at this time.  All issues noted in this document were discussed and addressed.  A limited physical exam was performed with this format.  Please refer to the patient's chart for his consent to telehealth for [facility].   Date:  12/28/2018   ID:  Richard Mueller, DOB 1956/01/01, MRN [id number]  Patient Location: Home Provider Location: Office  PCP:  Steele Sizer, MD  Cardiologist:  No primary care provider on file.  Electrophysiologist:  None   Evaluation Performed:  Follow-Up Visit  Chief Complaint: Doing well  History of Present Illness:    Richard Mueller is a 63 y.o. male was seen via video visit for a follow-up regarding hypertension and hyperlipidemia.  We started as a video visit but I could not hear him and thus had to switch to a phone visit.  He has no previous cardiac history. He does have a family history of CAD. He was evaluated in 2014 for atypical chest pain. He underwent a treadmill nuclear stress test which showed no evidence of ischemia with normal ejection fraction.   he has been taking his medications regularly and denies any chest pain, shortness of breath or palpitations.  No side effects. He had appendicitis last month and underwent successful appendectomy with no complications.  CT scan of the abdomen did show evidence of aortic atherosclerosis but there was no evidence of obstructive disease or aneurysm.  He has no claudication.  No chest pain or shortness of breath.   The patient does not have symptoms concerning for COVID-19  infection (fever, chills, cough, or new shortness of breath).    Past Medical History:  Diagnosis Date  . Allergic rhinitis, cause unspecified   . Allergy   . Bronchitis   . Esophageal reflux   . GERD (gastroesophageal reflux disease)   . Hyperlipidemia   . Hypertension   . Metabolic syndrome   . Obesity, unspecified   . Osteoarthritis   . Osteoarthrosis, unspecified whether generalized or localized, unspecified site   . Right upper quadrant pain    Past Surgical History:  Procedure Laterality Date  . COLONOSCOPY    . LAPAROSCOPIC APPENDECTOMY N/A 11/28/2018   Procedure: APPENDECTOMY LAPAROSCOPIC;  Surgeon: Jules Husbands, MD;  Location: [facility] ORS;  Service: General;  Laterality: N/A;  . ORTHOPEDIC SURGERY    . SKIN CANCER DESTRUCTION Left 03/06/2015   Hand- Dr. Phillip Heal  . SKIN CANCER EXCISION Left 08/2018   Dr. Phillip Heal on Left Arm      Current Meds  Medication Sig  . Ascorbic Acid (VITAMIN C) 1000 MG tablet Take 500 mg by mouth daily.   Marland Kitchen aspirin 81 MG tablet Take 81 mg by mouth daily.  Marland Kitchen atorvastatin (LIPITOR) 20 MG tablet TAKE 1 TABLET BY MOUTH DAILY.  . Calcium 200 MG TABS Take by mouth.  . fexofenadine (ALLEGRA) 180 MG tablet Take 180 mg by mouth as needed.   Marland Kitchen losartan (COZAAR) 50 MG tablet TAKE 1 TABLET BY MOUTH DAILY.  . Multiple Vitamin (MULTIVITAMIN) tablet Take 1 tablet by mouth daily.  . Omega-3 Fatty Acids (FISH  OIL) 1000 MG CAPS Take by mouth.     Allergies:   Lisinopril   Social History   Tobacco Use  . Smoking status: Former Smoker    Packs/day: 1.00    Years: 15.00    Pack years: 15.00    Types: Cigarettes    Start date: 08/24/1987    Last attempt to quit: 08/26/2002    Years since quitting: 16.3  . Smokeless tobacco: Never Used  Substance Use Topics  . Alcohol use: Yes    Alcohol/week: 0.0 standard drinks    Comment: ocassional  . Drug use: No     Family Hx: The patient's family history includes COPD in his father, mother, and sister; Cancer in  his father; Depression in his daughter; Heart attack in his mother; Heart attack (age of onset: 37) in his brother; Heart disease in his father and mother; Hypertension in his daughter and father.  ROS:   Please see the history of present illness.     All other systems reviewed and are negative.   Prior CV studies:   The following studies were reviewed today:  I reviewed the results of CT abdomen with him  Labs/Other Tests and Data Reviewed:    EKG:  No ECG reviewed.  Recent Labs: 11/27/2018: ALT 18; BUN 15; Creatinine, Ser 0.98; Hemoglobin 13.6; Platelets 181; Potassium 4.0; Sodium 137   Recent Lipid Panel Lab Results  Component Value Date/Time   CHOL 129 11/27/2018 04:57 PM   CHOL 147 12/20/2017 03:16 PM   TRIG 70 11/27/2018 04:57 PM   TRIG 108 02/27/2013 04:09 PM   HDL 45 11/27/2018 04:57 PM   HDL 56 12/20/2017 03:16 PM   HDL 42 02/27/2013 04:09 PM   CHOLHDL 2.9 11/27/2018 04:57 PM   LDLCALC 70 11/27/2018 04:57 PM   LDLCALC 74 12/20/2017 03:16 PM   LDLCALC 76 02/27/2013 04:09 PM    Wt Readings from Last 3 Encounters:  12/28/18 202 lb (91.6 kg)  12/06/18 202 lb 9.6 oz (91.9 kg)  11/27/18 211 lb (95.7 kg)     Objective:    Vital Signs:  Pulse 62   Ht 5\' 11"  (1.803 m)   Wt 202 lb (91.6 kg)   BMI 28.17 kg/m    VITAL SIGNS:  reviewed  ASSESSMENT & PLAN:    1.  Hyperlipidemia: Continue treatment with atorvastatin.  Most recent lipid profile showed an LDL of 70 which is optimal  2. Essential hypertension: Blood pressure is well controlled on current medications.  3.    Aortic atherosclerosis: Noted on CT scan of the abdomen: This was incidental finding.  The patient has no evidence of aortic aneurysm or obstructive disease and has no claudication.  I recommend continuing medical therapy of risk factors.   COVID-19 Education: The signs and symptoms of COVID-19 were discussed with the patient and how to seek care for testing (follow up with PCP or arrange  E-visit).  The importance of social distancing was discussed today.  Time:   Today, I have spent 15 minutes with the patient with telehealth technology discussing the above problems.     Medication Adjustments/Labs and Tests Ordered: Current medicines are reviewed at length with the patient today.  Concerns regarding medicines are outlined above.   Tests Ordered: No orders of the defined types were placed in this encounter.   Medication Changes: No orders of the defined types were placed in this encounter.   Disposition:  Follow up in 1 year(s)  Signed, Rogue Jury  Fletcher Anon, MD  12/28/2018 9:37 AM    [facility]

## 2018-12-29 DIAGNOSIS — M48062 Spinal stenosis, lumbar region with neurogenic claudication: Secondary | ICD-10-CM | POA: Diagnosis not present

## 2018-12-29 DIAGNOSIS — M47816 Spondylosis without myelopathy or radiculopathy, lumbar region: Secondary | ICD-10-CM | POA: Diagnosis not present

## 2018-12-29 DIAGNOSIS — M549 Dorsalgia, unspecified: Secondary | ICD-10-CM | POA: Diagnosis not present

## 2018-12-29 DIAGNOSIS — M546 Pain in thoracic spine: Secondary | ICD-10-CM | POA: Diagnosis not present

## 2018-12-29 DIAGNOSIS — M5136 Other intervertebral disc degeneration, lumbar region: Secondary | ICD-10-CM | POA: Diagnosis not present

## 2019-01-02 DIAGNOSIS — Z135 Encounter for screening for eye and ear disorders: Secondary | ICD-10-CM | POA: Diagnosis not present

## 2019-01-02 DIAGNOSIS — M48062 Spinal stenosis, lumbar region with neurogenic claudication: Secondary | ICD-10-CM | POA: Diagnosis not present

## 2019-01-03 DIAGNOSIS — M48062 Spinal stenosis, lumbar region with neurogenic claudication: Secondary | ICD-10-CM | POA: Diagnosis not present

## 2019-01-03 DIAGNOSIS — M47816 Spondylosis without myelopathy or radiculopathy, lumbar region: Secondary | ICD-10-CM | POA: Diagnosis not present

## 2019-01-03 DIAGNOSIS — M5136 Other intervertebral disc degeneration, lumbar region: Secondary | ICD-10-CM | POA: Diagnosis not present

## 2019-01-04 DIAGNOSIS — L578 Other skin changes due to chronic exposure to nonionizing radiation: Secondary | ICD-10-CM | POA: Diagnosis not present

## 2019-01-04 DIAGNOSIS — L570 Actinic keratosis: Secondary | ICD-10-CM | POA: Diagnosis not present

## 2019-01-04 DIAGNOSIS — Z872 Personal history of diseases of the skin and subcutaneous tissue: Secondary | ICD-10-CM | POA: Diagnosis not present

## 2019-01-04 DIAGNOSIS — Z85828 Personal history of other malignant neoplasm of skin: Secondary | ICD-10-CM | POA: Diagnosis not present

## 2019-01-17 DIAGNOSIS — M4726 Other spondylosis with radiculopathy, lumbar region: Secondary | ICD-10-CM | POA: Diagnosis not present

## 2019-01-17 DIAGNOSIS — M5136 Other intervertebral disc degeneration, lumbar region: Secondary | ICD-10-CM | POA: Diagnosis not present

## 2019-01-17 DIAGNOSIS — M48061 Spinal stenosis, lumbar region without neurogenic claudication: Secondary | ICD-10-CM | POA: Diagnosis not present

## 2019-03-08 ENCOUNTER — Ambulatory Visit: Payer: 59 | Admitting: Family Medicine

## 2019-06-05 ENCOUNTER — Encounter: Payer: 59 | Admitting: Family Medicine

## 2019-06-05 ENCOUNTER — Ambulatory Visit (INDEPENDENT_AMBULATORY_CARE_PROVIDER_SITE_OTHER): Payer: 59

## 2019-06-05 ENCOUNTER — Other Ambulatory Visit: Payer: Self-pay

## 2019-06-05 DIAGNOSIS — Z23 Encounter for immunization: Secondary | ICD-10-CM

## 2019-06-06 ENCOUNTER — Other Ambulatory Visit: Payer: Self-pay | Admitting: Family Medicine

## 2019-06-06 DIAGNOSIS — Z79899 Other long term (current) drug therapy: Secondary | ICD-10-CM

## 2019-06-07 ENCOUNTER — Other Ambulatory Visit: Payer: Self-pay

## 2019-06-07 ENCOUNTER — Ambulatory Visit
Admission: RE | Admit: 2019-06-07 | Discharge: 2019-06-07 | Disposition: A | Discharge status: Home / Self Care | Payer: 59 | Source: Ambulatory Visit | Attending: Family Medicine | Admitting: Family Medicine

## 2019-06-07 ENCOUNTER — Other Ambulatory Visit
Admission: RE | Admit: 2019-06-07 | Discharge: 2019-06-07 | Disposition: A | Discharge status: Home / Self Care | Payer: 59 | Source: Ambulatory Visit | Attending: Family Medicine | Admitting: Family Medicine

## 2019-06-07 DIAGNOSIS — Z79899 Other long term (current) drug therapy: Secondary | ICD-10-CM | POA: Diagnosis present

## 2019-06-07 DIAGNOSIS — D3502 Benign neoplasm of left adrenal gland: Secondary | ICD-10-CM | POA: Insufficient documentation

## 2019-06-07 LAB — CREATININE, SERUM
Creatinine, Ser: 0.95 mg/dL (ref 0.61–1.24)
GFR calc Af Amer: >60 mL/min (ref >60)
GFR calc non Af Amer: >60 mL/min (ref >60)

## 2019-06-07 MED ORDER — IOHEXOL 300 MG/ML  SOLN
100.0000 mL | Freq: Once | INTRAMUSCULAR | Status: AC | PRN
  Administered 2019-06-07: 100 mL via INTRAVENOUS

## 2019-06-08 ENCOUNTER — Encounter: Payer: Self-pay | Admitting: Family Medicine

## 2019-06-08 ENCOUNTER — Ambulatory Visit (INDEPENDENT_AMBULATORY_CARE_PROVIDER_SITE_OTHER): Payer: 59 | Admitting: Family Medicine

## 2019-06-08 ENCOUNTER — Ambulatory Visit
Admission: RE | Admit: 2019-06-08 | Discharge: 2019-06-08 | Disposition: A | Discharge status: Home / Self Care | Payer: 59 | Attending: Family Medicine | Admitting: Family Medicine

## 2019-06-08 ENCOUNTER — Telehealth: Payer: Self-pay

## 2019-06-08 ENCOUNTER — Other Ambulatory Visit: Payer: Self-pay

## 2019-06-08 ENCOUNTER — Ambulatory Visit
Admission: RE | Admit: 2019-06-08 | Discharge: 2019-06-08 | Disposition: A | Discharge status: Home / Self Care | Payer: 59 | Source: Ambulatory Visit | Attending: Family Medicine | Admitting: Family Medicine

## 2019-06-08 DIAGNOSIS — M79672 Pain in left foot: Secondary | ICD-10-CM

## 2019-06-08 DIAGNOSIS — D3502 Benign neoplasm of left adrenal gland: Secondary | ICD-10-CM

## 2019-06-08 DIAGNOSIS — I10 Essential (primary) hypertension: Secondary | ICD-10-CM

## 2019-06-08 DIAGNOSIS — I700 Atherosclerosis of aorta: Secondary | ICD-10-CM

## 2019-06-08 NOTE — Patient Instructions (Signed)
RICE Therapy for Routine Care of Injuries The routine care of many injuries includes rest, ice, compression, and elevation (RICE therapy). RICE therapy is often recommended for injuries to soft tissues, such as muscle strain, sprains, bruises, and overuse injuries. It can also be used for some bone injuries. Using RICE therapy can help to relieve pain and lessen swelling. Supplies needed:  Ice.  Plastic bag.  Towel.  Elastic bandage.  Pillow or pillows to raise (elevate) the injured body part. How to care for your injury with RICE therapy  Rest Rest your injury. This may help with the healing process. Rest usually involves limiting your normal activities and not using the injured part of your body. Generally, you can return to your normal activities when your health care provider says it is okay and you can do them without much discomfort. If you rest the injury too much, it may not heal as well. Some injuries heal better with early movement instead of resting for too long. Talk with your health care provider about how you should limit your activities and whether you should start range-of-motion exercises for your injury. Ice Ice your injury to lessen swelling and pain. Do not apply ice directly to your skin.  Put ice in a plastic bag.  Place a towel between your skin and the bag.  Leave the ice on for 20 minutes, 2-3 times a day. Use ice on as many days as told by your health care provider. Compression Put pressure (compression) on your injured area to control swelling, give support, and help with discomfort. Compression may be done with an elastic bandage. If an elastic bandage has been applied, follow these general tips:  Use the bandage as directed by the maker of the bandage that you are using.  Do notwrap the bandage too tightly. That may block (cut off) circulation in the arm or leg in the area below the bandage. ? If part of your body beyond the bandage becomes blue, numb,  cold, swollen, or more painful, your bandage is probably too tight. If this occurs, remove your bandage and reapply it more loosely.  Remove and reapply the bandage every 3-4 hours or as told by your health care provider.  See your health care provider if the bandage seems to be making your problems worse rather than better. Elevation Elevate your injured area to lessen swelling and pain. If possible, elevate your injured area at or above the level of your heart or the center of your chest. Contact a health care provider if:  Your pain and swelling continue.  Your symptoms are getting worse rather than improving. Having these problems may mean that you need further evaluation or imaging tests, such as X-rays or an MRI. Sometimes, X-rays may not show a small broken bone (fracture) until days after the injury happened. Make a follow-up appointment with your health care provider. Ask your health care provider, or the department that is doing the imaging test, when your results will be ready. Get help right away if:  You have sudden severe pain at or below the area of your injury.  You have redness or increased swelling around your injury.  You have tingling or numbness at or below the area of your injury and it does not improve after you remove the elastic bandage. Summary  The routine care of many injuries includes rest, ice, compression, and elevation (RICE therapy). Using RICE therapy can help to relieve pain and lessen swelling.  RICE therapy is   often recommended for injuries to soft tissues, such as muscle strain, sprains, bruises, and overuse injuries. It can also be used for some bone injuries.  Seek medical care if your pain and swelling continue or if your symptoms are getting worse rather than improving. This information is not intended to replace advice given to you by your health care provider. Make sure you discuss any questions you have with your health care provider. Document  Released: 11/21/2000 Document Revised: 04/29/2017 Document Reviewed: 04/29/2017 Elsevier Patient Education  2020 [employer].

## 2019-06-08 NOTE — Telephone Encounter (Signed)
Copied from [place] [phone]. Topic: Appointment Scheduling - Scheduling Inquiry for Clinic >> Jun 08, 2019  7:50 AM Scherrie Gerlach wrote: Reason for CRM:  pt states he stumbled into the ottoman last night and thinks he may have broken little toe and maybe one beside it.  this morning they were swollen. Pt would like to know if someone can look at it and maybe xray.  Please call

## 2019-06-08 NOTE — Progress Notes (Signed)
Name: Richard Mueller   MRN: [id number]    DOB: 1955/12/05   Date:06/08/2019       Progress Note  Subjective  Chief Complaint  Chief Complaint  Patient presents with  . Toe Injury    Onset-Ran into something last night Left foot and his two little toes-contusions and swollen    I connected with  Royal Hawthorn  on 06/08/19 at  9:20 AM EDT by a video enabled telemedicine application and verified that I am speaking with the correct person using two identifiers.  I discussed the limitations of evaluation and management by telemedicine and the availability of in person appointments. The patient expressed understanding and agreed to proceed. Staff also discussed with the patient that there may be a patient responsible charge related to this service. Patient Location: at home  Provider Location: [facility] Additional Individuals present: wife   HPI  Left Adrenal Adenoma: repeat CT abdomen was done yesterday and was consistent with small left adrenal adenoma, he already has an appointment with Dr. Dahlia Byes and will discuss getting labs or seeing Dr. Celine Ahr afterwards . Size is unchanged to slightly smaller but above 1 cm in size   Left foot injury: it happened last night 06/07/2019. He walked into a chair yesterday . He states this morning 4th and 5th toes are bruised, tender pain 8-9/10, causing his to limp and is worried about a fracture .   HTN: he states no problems lately, no chest pain or palpitation, taking medication daily. No side effects.    Patient Active Problem List   Diagnosis Date Noted  . Atherosclerosis of aorta ([place]) 12/06/2018  . Degenerative disc disease, lumbar 12/06/2018  . Adenoma of left adrenal gland 12/06/2018  . PVC (premature ventricular contraction) 12/20/2016  . History of squamous cell carcinoma of skin 05/26/2015  . GERD without esophagitis 05/26/2015  . Family history of cardiac disorder 03/17/2015  . Gastro-esophageal reflux disease  without esophagitis 03/17/2015  . Obesity (BMI 30-39.9) 03/17/2015  . Osteoarthrosis 03/17/2015  . Perennial allergic rhinitis with seasonal variation 03/17/2015  . Hyperlipidemia   . Hypertension   . Dysmetabolic syndrome [id number]    Past Surgical History:  Procedure Laterality Date  . COLONOSCOPY    . LAPAROSCOPIC APPENDECTOMY N/A 11/28/2018   Procedure: APPENDECTOMY LAPAROSCOPIC;  Surgeon: Jules Husbands, MD;  Location: [facility] ORS;  Service: General;  Laterality: N/A;  . ORTHOPEDIC SURGERY    . SKIN CANCER DESTRUCTION Left 03/06/2015   Hand- Dr. Phillip Heal  . SKIN CANCER EXCISION Left 08/2018   Dr. Phillip Heal on Left Arm     Family History  Problem Relation Age of Onset  . Heart disease Mother   . Heart attack Mother   . COPD Mother   . Heart disease Father   . Hypertension Father   . COPD Father   . Cancer Father         Prostate and Skin  . COPD Sister   . Hypertension Daughter   . Heart attack Brother 54       2014  . Depression Daughter     Social History   Socioeconomic History  . Marital status: Married    Spouse name: Butch Penny  . Number of children: 2  . Years of education: Not on file  . Highest education level: Some college, no degree  Occupational History  . Not on file  Social Needs  . Financial resource strain: Not hard at all  . Food insecurity  Worry: Never true    Inability: Never true  . Transportation needs    Medical: No    Non-medical: No  Tobacco Use  . Smoking status: Former Smoker    Packs/day: 1.00    Years: 15.00    Pack years: 15.00    Types: Cigarettes    Start date: 08/24/1987    Quit date: 08/26/2002    Years since quitting: 16.7  . Smokeless tobacco: Never Used  Substance and Sexual Activity  . Alcohol use: Yes    Alcohol/week: 0.0 standard drinks    Comment: ocassional  . Drug use: No  . Sexual activity: Yes    Partners: Female    Birth control/protection: None  Lifestyle  . Physical activity    Days per week: 0 days     Minutes per session: 0 min  . Stress: Only a little  Relationships  . Social connections    Talks on phone: More than three times a week    Gets together: Once a week    Attends religious service: More than 4 times per year    Active member of club or organization: Yes    Attends meetings of clubs or organizations: More than 4 times per year    Relationship status: Married  . Intimate partner violence    Fear of current or ex partner: No    Emotionally abused: No    Physically abused: No    Forced sexual activity: No  Other Topics Concern  . Not on file  Social History Narrative  . Not on file     Current Outpatient Medications:  .  Ascorbic Acid (VITAMIN C) 1000 MG tablet, Take 500 mg by mouth daily. , Disp: , Rfl:  .  aspirin 81 MG tablet, Take 81 mg by mouth daily., Disp: , Rfl:  .  atorvastatin (LIPITOR) 20 MG tablet, Take 1 tablet (20 mg total) by mouth daily., Disp: 90 tablet, Rfl: 3 .  Calcium 200 MG TABS, Take by mouth., Disp: , Rfl:  .  fexofenadine (ALLEGRA) 180 MG tablet, Take 180 mg by mouth as needed. , Disp: , Rfl:  .  losartan (COZAAR) 50 MG tablet, Take 1 tablet (50 mg total) by mouth daily., Disp: 90 tablet, Rfl: 3 .  Multiple Vitamin (MULTIVITAMIN) tablet, Take 1 tablet by mouth daily., Disp: , Rfl:  .  Omega-3 Fatty Acids (FISH OIL) 1000 MG CAPS, Take by mouth., Disp: , Rfl:   Allergies  Allergen Reactions  . Lisinopril     Rash, pins and needles in arms    I personally reviewed active problem list, medication list, allergies, family history, social history, health maintenance with the patient/caregiver today.   ROS  Ten systems reviewed and is negative except as mentioned in HPI   Objective  Virtual encounter, vitals not obtained.  There is no height or weight on file to calculate BMI.  Physical Exam  Awake, alert and oriented Bruised top of 4th toe Bruise on the bottom of left 5th toe   Results for orders placed or performed during the  hospital encounter of 06/07/19 (from the past 72 hour(s))  Creatinine     Status: None   Collection Time: 06/07/19  8:57 AM  Result Value Ref Range   Creatinine, Ser 0.95 0.61 - 1.24 mg/dL   GFR calc non Af Amer >60 >60 mL/min   GFR calc Af Amer >60 >60 mL/min    Comment: Performed at [facility], [phone]  [address] [place], [place] [address]    PHQ2/9: Depression screen [facility] [place] 2/9 06/08/2019 12/06/2018 11/27/2018 12/20/2016 06/22/2016  Decreased Interest 0 0 0 0 0  Down, Depressed, Hopeless 0 0 0 0 0  PHQ - 2 Score 0 0 0 0 0  Altered sleeping 0 0 0 - -  Tired, decreased energy 0 0 0 - -  Change in appetite 0 0 0 - -  Feeling bad or failure about yourself  0 0 0 - -  Trouble concentrating 0 0 0 - -  Moving slowly or fidgety/restless 0 0 0 - -  Suicidal thoughts 0 0 0 - -  PHQ-9 Score 0 0 0 - -  Difficult doing work/chores Not difficult at all - Not difficult at all - -   PHQ-2/9 Result is negative.    Fall Risk: Fall Risk  12/06/2018 11/27/2018 12/20/2016 06/22/2016 02/09/2016  Falls in the past year? 0 0 No No No  Number falls in past yr: 0 0 - - -  Injury with Fall? 0 0 - - -     Assessment & Plan  1. Adenoma of left adrenal gland  Keep follow up with  Dr. Dahlia Byes  2. Atherosclerosis of aorta (H[facility])  Continue statin therapy   3. Essential hypertension  He has medication at pharmacy  4. Acute foot pain, left  - DG Foot Complete Left; Future  I discussed the assessment and treatment plan with the patient. The patient was provided an opportunity to ask questions and all were answered. The patient agreed with the plan and demonstrated an understanding of the instructions.  The patient was advised to call back or seek an in-person evaluation if the symptoms worsen or if the condition fails to improve as anticipated.  I provided 25  minutes of non-face-to-face time during this encounter.

## 2019-06-13 ENCOUNTER — Other Ambulatory Visit: Payer: Self-pay

## 2019-06-13 ENCOUNTER — Ambulatory Visit (INDEPENDENT_AMBULATORY_CARE_PROVIDER_SITE_OTHER): Payer: 59 | Admitting: Surgery

## 2019-06-13 ENCOUNTER — Encounter: Payer: Self-pay | Admitting: Surgery

## 2019-06-13 VITALS — BP 112/73 | HR 58 | Temp 97.3°F | Ht 71.0 in | Wt 213.2 lb

## 2019-06-13 DIAGNOSIS — E278 Other specified disorders of adrenal gland: Secondary | ICD-10-CM | POA: Diagnosis not present

## 2019-06-13 NOTE — Patient Instructions (Signed)
Patient is recommended to exercise more, watch diet and maintain blood pressure. If you have any questions or concerns, please feel free to contact our office.

## 2019-06-14 ENCOUNTER — Encounter: Payer: Self-pay | Admitting: Surgery

## 2019-06-14 NOTE — Progress Notes (Signed)
Outpatient Surgical Follow Up  06/14/2019  Richard Mueller is an 63 y.o. male.   Chief Complaint  Patient presents with  . Follow-up    CT f/u - L adrenal lesion 06/07/19    HPI: Is a 63 year old well-known to me with a prior history of acute appendicitis requiring appendectomy and at that time a left adrenal incidentaloma was found.  He is following up after 6 months follow-up with CT and physical exam.  He does not have any signs and symptoms consistent with either tumor producing adenoma.  No fevers no chills.  No uncontrolled hypertension.  He has done very well after his appendectomy.  Did have a CT scan of the abdomen and pelvis that I have personally reviewed showing a small left adrenal adenoma 2 cm in size.  No concerning for carcinoma.  Past Medical History:  Diagnosis Date  . Allergic rhinitis, cause unspecified   . Allergy   . Bronchitis   . Esophageal reflux   . GERD (gastroesophageal reflux disease)   . Hyperlipidemia   . Hypertension   . Metabolic syndrome   . Obesity, unspecified   . Osteoarthritis   . Osteoarthrosis, unspecified whether generalized or localized, unspecified site   . Right upper quadrant pain     Past Surgical History:  Procedure Laterality Date  . COLONOSCOPY    . LAPAROSCOPIC APPENDECTOMY N/A 11/28/2018   Procedure: APPENDECTOMY LAPAROSCOPIC;  Surgeon: Jules Husbands, MD;  Location: [facility] ORS;  Service: General;  Laterality: N/A;  . ORTHOPEDIC SURGERY    . SKIN CANCER DESTRUCTION Left 03/06/2015   Hand- Dr. Phillip Heal  . SKIN CANCER EXCISION Left 08/2018   Dr. Phillip Heal on Left Arm     Family History  Problem Relation Age of Onset  . Heart disease Mother   . Heart attack Mother   . COPD Mother   . Heart disease Father   . Hypertension Father   . COPD Father   . Cancer Father         Prostate and Skin  . COPD Sister   . Hypertension Daughter   . Heart attack Brother 54       2014  . Depression Daughter     Social History:  reports that  he quit smoking about 16 years ago. His smoking use included cigarettes. He started smoking about 31 years ago. He has a 15.00 pack-year smoking history. He has never used smokeless tobacco. He reports current alcohol use. He reports that he does not use drugs.  Allergies:  Allergies  Allergen Reactions  . Lisinopril     Rash, pins and needles in arms    Medications reviewed.    ROS Full ROS performed and is otherwise negative other than what is stated in HPI   BP 112/73   Pulse (!) 58   Temp (!) 97.3 F (36.3 C) (Temporal)   Ht 5\' 11"  (1.803 m)   Wt 213 lb 3.2 oz (96.7 kg)   SpO2 97%   BMI 29.74 kg/m   Physical Exam Vitals signs and nursing note reviewed. Exam conducted with a chaperone present.  Constitutional:      General: He is not in acute distress.    Appearance: Normal appearance. He is normal weight.  Eyes:     General:        Right eye: No discharge.        Left eye: No discharge.  Neck:     Musculoskeletal: Normal range of motion. No neck  rigidity or muscular tenderness.  Cardiovascular:     Rate and Rhythm: Normal rate and regular rhythm.     Pulses: Normal pulses.  Pulmonary:     Effort: Pulmonary effort is normal. No respiratory distress.     Breath sounds: Normal breath sounds.  Abdominal:     General: Abdomen is flat. There is no distension.     Palpations: There is no mass.     Tenderness: There is no abdominal tenderness. There is no guarding or rebound.     Hernia: No hernia is present.  Lymphadenopathy:     Cervical: No cervical adenopathy.  Skin:    Capillary Refill: Capillary refill takes less than 2 seconds.  Neurological:     General: No focal deficit present.     Mental Status: He is alert and oriented to person, place, and time.  Psychiatric:        Mood and Affect: Mood normal.        Behavior: Behavior normal.        Thought Content: Thought content normal.        Judgment: Judgment normal.       Assessment/Plan: Left  adrenal incidentaloma.  We will follow him up in a year with CT scan and physical exam.  Discussed with the patient in detail and offered to perform a biochemical evaluation.  He wishes just to follow-up with me in 1 year with images.  No need for any emergent surgical intervention at this time Greater than 50% of the 25 minutes  visit was spent in counseling/coordination of care   Caroleen Hamman, MD [place] Surgeon

## 2019-06-19 DIAGNOSIS — M542 Cervicalgia: Secondary | ICD-10-CM | POA: Insufficient documentation

## 2019-07-09 ENCOUNTER — Other Ambulatory Visit: Payer: Self-pay

## 2019-07-09 DIAGNOSIS — Z20822 Contact with and (suspected) exposure to covid-19: Secondary | ICD-10-CM

## 2019-07-10 LAB — NOVEL CORONAVIRUS, NAA: SARS-CoV-2, NAA: NOT DETECTED

## 2019-07-10 LAB — INPATIENT

## 2019-08-07 DIAGNOSIS — C44319 Basal cell carcinoma of skin of other parts of face: Secondary | ICD-10-CM | POA: Insufficient documentation

## 2019-08-15 ENCOUNTER — Encounter: Payer: Self-pay | Admitting: Family Medicine

## 2019-08-16 ENCOUNTER — Encounter: Payer: Self-pay | Admitting: Family Medicine

## 2019-08-29 ENCOUNTER — Ambulatory Visit: Payer: 59 | Attending: Internal Medicine

## 2019-08-29 DIAGNOSIS — Z20822 Contact with and (suspected) exposure to covid-19: Secondary | ICD-10-CM

## 2019-08-31 LAB — NOVEL CORONAVIRUS, NAA: SARS-CoV-2, NAA: NOT DETECTED

## 2019-09-07 ENCOUNTER — Encounter: Payer: Self-pay | Admitting: Family Medicine

## 2019-09-25 ENCOUNTER — Encounter: Payer: Self-pay | Admitting: Family Medicine

## 2019-10-09 ENCOUNTER — Ambulatory Visit: Payer: 59 | Attending: Internal Medicine

## 2019-10-09 DIAGNOSIS — Z20822 Contact with and (suspected) exposure to covid-19: Secondary | ICD-10-CM

## 2019-10-10 LAB — NOVEL CORONAVIRUS, NAA: SARS-CoV-2, NAA: NOT DETECTED

## 2019-10-23 ENCOUNTER — Ambulatory Visit: Payer: 59 | Attending: Internal Medicine

## 2019-10-23 DIAGNOSIS — Z23 Encounter for immunization: Secondary | ICD-10-CM | POA: Insufficient documentation

## 2019-10-23 NOTE — Progress Notes (Signed)
   Covid-19 Vaccination Clinic  Name:  Richard Mueller    MRN: [id number] DOB: 1955-12-13  10/23/2019  Richard Mueller was observed post Covid-19 immunization for 15 minutes without incident. He was provided with Vaccine Information Sheet and instruction to access the V-Safe system.   Richard Mueller was instructed to call 911 with any severe reactions post vaccine: Marland Kitchen Difficulty breathing  . Swelling of face and throat  . A fast heartbeat  . A bad rash all over body  . Dizziness and weakness   Immunizations Administered    Name Date Dose VIS Date Route   Pfizer COVID-19 Vaccine 10/23/2019 11:53 AM 0.3 mL 08/03/2019 Intramuscular   Manufacturer: [place]   Lot: [id number]   [place]: [id number]

## 2019-11-20 ENCOUNTER — Ambulatory Visit: Payer: 59 | Attending: Internal Medicine

## 2019-11-20 DIAGNOSIS — Z23 Encounter for immunization: Secondary | ICD-10-CM

## 2019-11-20 NOTE — Progress Notes (Signed)
   Covid-19 Vaccination Clinic  Name:  Richard Mueller    MRN: [id number] DOB: 06/27/1956  11/20/2019  Richard Mueller was observed post Covid-19 immunization for 15 minutes without incident. He was provided with Vaccine Information Sheet and instruction to access the V-Safe system.   Richard Mueller was instructed to call 911 with any severe reactions post vaccine: Marland Kitchen Difficulty breathing  . Swelling of face and throat  . A fast heartbeat  . A bad rash all over body  . Dizziness and weakness   Immunizations Administered    Name Date Dose VIS Date Route   Pfizer COVID-19 Vaccine 11/20/2019  8:09 AM 0.3 mL 08/03/2019 Intramuscular   Manufacturer: [place]   Lot: [id number]   [place]: [id number]

## 2019-11-21 ENCOUNTER — Other Ambulatory Visit: Payer: 59

## 2019-11-23 ENCOUNTER — Ambulatory Visit: Payer: Self-pay

## 2019-11-23 NOTE — Telephone Encounter (Signed)
Pt. called to report onset of intermittent right sided chest pain about 2 mos. ago.  Stated that he initially noticed the chest pain while chopping wood.  Has intermittent episodes 1-2 time/wk.  Denied having chest pain at present.  Reported last episode of chest pain was Wed., 3/31, and lasted about one hour.  Reported the pain occurs between the right breast and collar bone.  Denied radiation to neck, jaw, shoulders, or arms.  Denied associated sx's of shortness of breath, dizziness, nausea, vomiting, or sweating when chest pain occurs.  Recommended pt. to get evaluated for the chest pain to rule out cardiac cause.  Advised there are no openings at the PCP office today or Monday.  Recommended that due to the duration of the chest discomfort, he should consider being evaluated at the ER.  Pt. Stated he has out of town guests arriving today, and does not want to go to the ER.  Advised if symptoms worsen; ie: worsening chest pain, radiation of chest pain, shortness of breath, sweating, nausea, etc, to seek emergency evaluation.  Pt. Given appt. For 11/27/19 at 9:00 AM with PCP office.  Also advised to call his Cardiologist, to make aware of symptoms.  Pt. Verb. Understanding of instructions and agreed with plan.      Reason for Disposition . [1] Chest pain lasts > 5 minutes AND [2] occurred in past 3 days (72 hours)  Answer Assessment - Initial Assessment Questions 1. LOCATION: "Where does it hurt?"       Right chest between right breast and collar bone 2. RADIATION: "Does the pain go anywhere else?" (e.g., into neck, jaw, arms, back)    Denied 3. ONSET: "When did the chest pain begin?" (Minutes, hours or days)      2 mos. ago 4. PATTERN "Does the pain come and go, or has it been constant since it started?"  "Does it get worse with exertion?"      Comes and goes  5. DURATION: "How long does it last" (e.g., seconds, minutes, hours)     Has lasted 1/2 day before 6. SEVERITY: "How bad is the pain?"   (e.g., Scale 1-10; mild, moderate, or severe)    - MILD (1-3): doesn't interfere with normal activities     - MODERATE (4-7): interferes with normal activities or awakens from sleep    - SEVERE (8-10): excruciating pain, unable to do any normal activities      3-4/10 7. CARDIAC RISK FACTORS: "Do you have any history of heart problems or risk factors for heart disease?" (e.g., angina, prior heart attack; diabetes, high blood pressure, high cholesterol, smoker, or strong family history of heart disease)     High blood pressure, hx of smoking- quit 17 yrs ago., both parents with CV disease; mother MI, father several stents 22. PULMONARY RISK FACTORS: "Do you have any history of lung disease?"  (e.g., blood clots in lung, asthma, emphysema, birth control pills)     Denied hx of lung disease  9. CAUSE: "What do you think is causing the chest pain?"     unknown 10. OTHER SYMPTOMS: "Do you have any other symptoms?" (e.g., dizziness, nausea, vomiting, sweating, fever, difficulty breathing, cough)       Denied any associated dizziness, shortness of breath, N/V, sweating.  11. PREGNANCY: "Is there any chance you are pregnant?" "When was your last menstrual period?"       N/a  Protocols used: CHEST PAIN-A-AH

## 2019-11-27 ENCOUNTER — Ambulatory Visit
Admission: RE | Admit: 2019-11-27 | Discharge: 2019-11-27 | Disposition: A | Discharge status: Home / Self Care | Payer: 59 | Source: Ambulatory Visit | Attending: Family Medicine | Admitting: Family Medicine

## 2019-11-27 ENCOUNTER — Ambulatory Visit
Admission: RE | Admit: 2019-11-27 | Discharge: 2019-11-27 | Disposition: A | Discharge status: Home / Self Care | Payer: 59 | Attending: Family Medicine | Admitting: Family Medicine

## 2019-11-27 ENCOUNTER — Other Ambulatory Visit: Payer: Self-pay

## 2019-11-27 ENCOUNTER — Ambulatory Visit: Payer: 59 | Admitting: Family Medicine

## 2019-11-27 ENCOUNTER — Encounter: Payer: Self-pay | Admitting: Family Medicine

## 2019-11-27 VITALS — BP 118/70 | HR 60 | Temp 98.3°F | Resp 14 | Ht 71.0 in | Wt 211.9 lb

## 2019-11-27 DIAGNOSIS — R079 Chest pain, unspecified: Secondary | ICD-10-CM | POA: Insufficient documentation

## 2019-11-27 DIAGNOSIS — R0789 Other chest pain: Secondary | ICD-10-CM

## 2019-11-27 NOTE — Progress Notes (Signed)
Patient ID: Kathlyn Sacramento III, male    DOB: [birth year], 64 y.o.   MRN: [id number]  PCP: Steele Sizer, MD  Chief Complaint  Patient presents with  . Chest Pain    x2 months right upper    Subjective:   Lunden [facility] is a 64 y.o. male, presents to clinic with CC of the following:  Insideous onset of right upper chest pain described as a pressure, pain comes and goes, does not notice anything that causes pain to reoccur (randomly comes and goes, has been on conference call before and it came back, not related to eating, exertion) Sometimes feels achy or sore, no radiation, no change with deep inspiration, palpation or movement of shoulder arms.  Chest Pain  This is a new problem. Episode onset: couple months maybe 2 months. The problem occurs intermittently. The problem has been unchanged. Pain location: right upper anterior chest wall/pectoral area. The pain is at a severity of 4/10. The pain does not radiate. Associated symptoms include back pain (chronic no change). Pertinent negatives include no abdominal pain, claudication, cough, diaphoresis, dizziness, exertional chest pressure, fever, headaches, hemoptysis, irregular heartbeat, leg pain, lower extremity edema, nausea, near-syncope, orthopnea, palpitations, PND, shortness of breath, sputum production, syncope, vomiting or weakness. The pain is aggravated by nothing. Treatments tried: he take naproxen for back pain, no difference to chest wall pain. The treatment provided no relief. Risk factors include male gender and obesity.  Pertinent negatives for past medical history include no MI.  His family medical history is significant for CAD, heart disease, hyperlipidemia, hypertension and early MI. Prior diagnostic workup includes echocardiogram and exercise treadmill test (has cardiology f/up this month (annual)).      Patient Active Problem List   Diagnosis Date Noted  . Basal cell carcinoma, forehead 08/07/2019    . Atherosclerosis of aorta ([place]) 12/06/2018  . Degenerative disc disease, lumbar 12/06/2018  . Adenoma of left adrenal gland 12/06/2018  . PVC (premature ventricular contraction) 12/20/2016  . History of squamous cell carcinoma of skin 05/26/2015  . Family history of cardiac disorder 03/17/2015  . Gastro-esophageal reflux disease without esophagitis 03/17/2015  . Obesity (BMI 30-39.9) 03/17/2015  . Osteoarthrosis 03/17/2015  . Perennial allergic rhinitis with seasonal variation 03/17/2015  . Hyperlipidemia   . Hypertension   . Dysmetabolic syndrome [id number]      Current Outpatient Medications:  .  Aloe Vera GEL, Apply topically., Disp: , Rfl:  .  Apoaequorin (PREVAGEN PO), Take by mouth., Disp: , Rfl:  .  Ascorbic Acid (VITAMIN C) 1000 MG tablet, Take 500 mg by mouth daily. , Disp: , Rfl:  .  aspirin 81 MG tablet, Take 81 mg by mouth daily., Disp: , Rfl:  .  atorvastatin (LIPITOR) 20 MG tablet, Take 1 tablet (20 mg total) by mouth daily., Disp: 90 tablet, Rfl: 3 .  b complex vitamins tablet, Take 1 tablet by mouth daily., Disp: , Rfl:  .  Calcium 200 MG TABS, Take by mouth., Disp: , Rfl:  .  cholecalciferol (VITAMIN D3) 25 MCG (1000 UNIT) tablet, Take 1,000 Units by mouth daily., Disp: , Rfl:  .  Ginkgo Biloba 100 MG CAPS, Take by mouth., Disp: , Rfl:  .  losartan (COZAAR) 50 MG tablet, Take 1 tablet (50 mg total) by mouth daily., Disp: 90 tablet, Rfl: 3 .  Multiple Vitamin (MULTIVITAMIN) tablet, Take 1 tablet by mouth daily., Disp: , Rfl:  .  naproxen (NAPROSYN) 500 MG tablet, Take 500  mg by mouth 2 (two) times daily with a meal., Disp: , Rfl:  .  Omega-3 Fatty Acids (FISH OIL) 1000 MG CAPS, Take by mouth., Disp: , Rfl:  .  fexofenadine (ALLEGRA) 180 MG tablet, Take 180 mg by mouth as needed. , Disp: , Rfl:    Allergies  Allergen Reactions  . Lisinopril     Rash, pins and needles in arms     Family History  Problem Relation Age of Onset  . Heart disease Mother   .  Heart attack Mother   . COPD Mother   . Heart disease Father   . Hypertension Father   . COPD Father   . Cancer Father         Prostate and Skin  . COPD Sister   . Hypertension Daughter   . Heart attack Brother 54       2014  . Depression Daughter      Social History   Socioeconomic History  . Marital status: Married    Spouse name: Butch Penny  . Number of children: 2  . Years of education: Not on file  . Highest education level: Some college, no degree  Occupational History  . Not on file  Tobacco Use  . Smoking status: Former Smoker    Packs/day: 1.00    Years: 15.00    Pack years: 15.00    Types: Cigarettes    Start date: 08/24/1987    Quit date: 08/26/2002    Years since quitting: 17.2  . Smokeless tobacco: Never Used  Substance and Sexual Activity  . Alcohol use: Yes    Alcohol/week: 0.0 standard drinks    Comment: ocassional  . Drug use: No  . Sexual activity: Yes    Partners: Female    Birth control/protection: None  Other Topics Concern  . Not on file  Social History Narrative  . Not on file   Social Determinants of Health   Financial Resource Strain: Low Risk   . Difficulty of Paying Living Expenses: Not hard at all  Food Insecurity: No Food Insecurity  . Worried About Charity fundraiser in the Last Year: Never true  . Ran Out of Food in the Last Year: Never true  Transportation Needs: No Transportation Needs  . Lack of Transportation (Medical): No  . Lack of Transportation (Non-Medical): No  Physical Activity: Inactive  . Days of Exercise per Week: 0 days  . Minutes of Exercise per Session: 0 min  Stress: No Stress Concern Present  . Feeling of Stress : Only a little  Social Connections: Not Isolated  . Frequency of Communication with Friends and Family: More than three times a week  . Frequency of Social Gatherings with Friends and Family: Once a week  . Attends Religious Services: More than 4 times per year  . Active Member of Clubs or  Organizations: Yes  . Attends Archivist Meetings: More than 4 times per year  . Marital Status: Married  Human resources officer Violence: Not At Risk  . Fear of Current or Ex-Partner: No  . Emotionally Abused: No  . Physically Abused: No  . Sexually Abused: No    Chart Review Today: I personally reviewed active problem list, medication list, allergies, family history, social history, health maintenance, notes from last encounter, lab results, imaging with the patient/caregiver today.   Review of Systems  Constitutional: Negative.  Negative for diaphoresis and fever.  HENT: Negative.   Eyes: Negative.   Respiratory: Negative.  Negative for cough, hemoptysis, sputum production and shortness of breath.   Cardiovascular: Positive for chest pain. Negative for palpitations, orthopnea, claudication, syncope, PND and near-syncope.  Gastrointestinal: Negative.  Negative for abdominal pain, nausea and vomiting.  Endocrine: Negative.   Genitourinary: Negative.   Musculoskeletal: Positive for back pain (chronic no change).  Skin: Negative.   Allergic/Immunologic: Negative.   Neurological: Negative.  Negative for dizziness, weakness and headaches.  Hematological: Negative.   Psychiatric/Behavioral: Negative.   All other systems reviewed and are negative.      Objective:   Vitals:   11/27/19 0859  BP: 118/70  Pulse: 60  Resp: 14  Temp: 98.3 F (36.8 C)  SpO2: 99%  Weight: 211 lb 14.4 oz (96.1 kg)  Height: 5\' 11"  (1.803 m)    Body mass index is 29.55 kg/m.  Physical Exam Vitals and nursing note reviewed.  Constitutional:      General: He is not in acute distress.    Appearance: Normal appearance. He is well-developed. He is not ill-appearing, toxic-appearing or diaphoretic.     Interventions: Face mask in place.  HENT:     Head: Normocephalic and atraumatic.     Jaw: No trismus.     Right Ear: External ear normal.     Left Ear: External ear normal.  Eyes:      General: Lids are normal. No scleral icterus.    Conjunctiva/sclera: Conjunctivae normal.     Pupils: Pupils are equal, round, and reactive to light.  Neck:     Trachea: Trachea and phonation normal. No tracheal deviation.  Cardiovascular:     Rate and Rhythm: Normal rate and regular rhythm.     Pulses: Normal pulses.          Radial pulses are 2+ on the right side and 2+ on the left side.       Posterior tibial pulses are 2+ on the right side and 2+ on the left side.     Heart sounds: Normal heart sounds. No murmur. No friction rub. No gallop.   Pulmonary:     Effort: Pulmonary effort is normal. No respiratory distress.     Breath sounds: Normal breath sounds. No stridor. No wheezing, rhonchi or rales.  Chest:     Chest wall: No mass, deformity, crepitus or edema.     Breasts:        Right: Tenderness present. No swelling, bleeding, inverted nipple, mass, nipple discharge or skin change.        Left: No swelling, bleeding, inverted nipple, mass, nipple discharge or skin change.       Comments: Bilateral mild gynecomastia with tenderness to right pec/chest area, no Edema induration nodules palpated no abnormalities noted to chest wall -he does have some mild tenderness to right pec area that is annotated on graphics/image  Abdominal:     General: Bowel sounds are normal. There is no distension.     Palpations: Abdomen is soft.     Tenderness: There is no abdominal tenderness. There is no guarding or rebound.  Musculoskeletal:        General: Normal range of motion.     Cervical back: Normal range of motion and neck supple.     Right lower leg: No edema.     Left lower leg: No edema.  Lymphadenopathy:     Upper Body:     Right upper body: No supraclavicular, axillary or pectoral adenopathy.     Left upper body: No supraclavicular, axillary or  pectoral adenopathy.  Skin:    General: Skin is warm and dry.     Capillary Refill: Capillary refill takes less than 2 seconds.      Coloration: Skin is not jaundiced.     Findings: No rash.     Nails: There is no clubbing.  Neurological:     Mental Status: He is alert.     Cranial Nerves: No dysarthria or facial asymmetry.     Motor: No tremor or abnormal muscle tone.     Gait: Gait normal.  Psychiatric:        Mood and Affect: Mood normal.        Speech: Speech normal.        Behavior: Behavior normal. Behavior is cooperative.      Results for orders placed or performed in visit on 10/09/19  Novel Coronavirus, NAA ([facility])   Specimen: Oropharyngeal(OP) collection in vial transport medium   OROPHARYNGEA  TESTING  Result Value Ref Range   SARS-CoV-2, NAA Not Detected Not Detected        Assessment & Plan:      ICD-10-CM   1. Chest pain, unspecified type  R07.9 EKG 12-Lead    DG Chest 2 View  2. Right-sided chest wall pain  R07.89 DG Chest 2 View    Patient presents with atypical chest pain is not pleuritic in nature, doubt ACS no exertional symptoms, EKG reviewed today unremarkable without any changes from prior, will do chest x-ray just to assure that lungs are clear cardiac silhouette is normal and no abnormalities noted with chest wall or ribs.  It does not even truly see muscle skeletal it may be in the breast tissue he does have slightly enlarged tissue bilaterally, does not appear to have any nodules or any infection or abscess.  I did explain that he could continue to monitor for chest wall NSAIDs rest ice to the area and avoiding strenuous activity may help improve symptoms.  If he finds it more tender I did encourage him to follow-up here with me or his PCP we may be able to do ultrasound or mammogram to that area to make sure it is breast tissue explained how this can happen with men there is hormonal work-up and imaging that we can do to check on it.  Patient does not want to do that right now but he verbalized understanding of plan.  He is given a handout regarding concerning signs and symptoms to  follow-up immediately or go to the ER.  He verbalized understanding of ER precautions  Patient was well-appearing vital signs stable.    Delsa Grana, PA-C 11/27/19 9:17 AM

## 2019-11-27 NOTE — Patient Instructions (Signed)
I would rest your pecs and not palpate on that area, apply ice.  I would follow up with your PCP if it is not resolving or if getting worse.  Nonspecific Chest Pain Chest pain can be caused by many different conditions. Some causes of chest pain can be life-threatening. These will require treatment right away. Serious causes of chest pain include:  Heart attack.  A tear in the body's main blood vessel.  Redness and swelling (inflammation) around your heart.  Blood clot in your lungs. Other causes of chest pain may not be so serious. These include:  Heartburn.  Anxiety or stress.  Damage to bones or muscles in your chest.  Lung infections. Chest pain can feel like:  Pain or discomfort in your chest.  Crushing, pressure, aching, or squeezing pain.  Burning or tingling.  Dull or sharp pain that is worse when you move, cough, or take a deep breath.  Pain or discomfort that is also felt in your back, neck, jaw, shoulder, or arm, or pain that spreads to any of these areas. It is hard to know whether your pain is caused by something that is serious or something that is not so serious. So it is important to see your doctor right away if you have chest pain. Follow these instructions at home: Medicines  Take over-the-counter and prescription medicines only as told by your doctor.  If you were prescribed an antibiotic medicine, take it as told by your doctor. Do not stop taking the antibiotic even if you start to feel better. Lifestyle   Rest as told by your doctor.  Do not use any products that contain nicotine or tobacco, such as cigarettes, e-cigarettes, and chewing tobacco. If you need help quitting, ask your doctor.  Do not drink alcohol.  Make lifestyle changes as told by your doctor. These may include: ? Getting regular exercise. Ask your doctor what activities are safe for you. ? Eating a heart-healthy diet. A diet and nutrition specialist (dietitian) can help you to  learn healthy eating options. ? Staying at a healthy weight. ? Treating diabetes or high blood pressure, if needed. ? Lowering your stress. Activities such as yoga and relaxation techniques can help. General instructions  Pay attention to any changes in your symptoms. Tell your doctor about them or any new symptoms.  Avoid any activities that cause chest pain.  Keep all follow-up visits as told by your doctor. This is important. You may need more testing if your chest pain does not go away. Contact a doctor if:  Your chest pain does not go away.  You feel depressed.  You have a fever. Get help right away if:  Your chest pain is worse.  You have a cough that gets worse, or you cough up blood.  You have very bad (severe) pain in your belly (abdomen).  You pass out (faint).  You have either of these for no clear reason: ? Sudden chest discomfort. ? Sudden discomfort in your arms, back, neck, or jaw.  You have shortness of breath at any time.  You suddenly start to sweat, or your skin gets clammy.  You feel sick to your stomach (nauseous).  You throw up (vomit).  You suddenly feel lightheaded or dizzy.  You feel very weak or tired.  Your heart starts to beat fast, or it feels like it is skipping beats. These symptoms may be an emergency. Do not wait to see if the symptoms will go away. Get medical  help right away. Call your local emergency services (911 in the [place] Do not drive yourself to the hospital. Summary  Chest pain can be caused by many different conditions. The cause may be serious and need treatment right away. If you have chest pain, see your doctor right away.  Follow your doctor's instructions for taking medicines and making lifestyle changes.  Keep all follow-up visits as told by your doctor. This includes visits for any further testing if your chest pain does not go away.  Be sure to know the signs that show that your condition has become worse. Get  help right away if you have these symptoms. This information is not intended to replace advice given to you by your health care provider. Make sure you discuss any questions you have with your health care provider. Document Revised: 02/09/2018 Document Reviewed: 02/09/2018 Elsevier Patient Education  2020 [employer].

## 2019-12-14 ENCOUNTER — Encounter: Payer: Self-pay | Admitting: Family Medicine

## 2019-12-18 ENCOUNTER — Other Ambulatory Visit: Payer: Self-pay

## 2019-12-18 ENCOUNTER — Ambulatory Visit: Payer: 59 | Admitting: Family Medicine

## 2019-12-18 ENCOUNTER — Encounter: Payer: Self-pay | Admitting: Family Medicine

## 2019-12-18 VITALS — BP 122/70 | HR 83 | Temp 97.5°F | Resp 16 | Ht 68.0 in | Wt 209.8 lb

## 2019-12-18 DIAGNOSIS — E7800 Pure hypercholesterolemia, unspecified: Secondary | ICD-10-CM

## 2019-12-18 DIAGNOSIS — I10 Essential (primary) hypertension: Secondary | ICD-10-CM

## 2019-12-18 DIAGNOSIS — E8881 Metabolic syndrome: Secondary | ICD-10-CM

## 2019-12-18 DIAGNOSIS — I700 Atherosclerosis of aorta: Secondary | ICD-10-CM

## 2019-12-18 DIAGNOSIS — E278 Other specified disorders of adrenal gland: Secondary | ICD-10-CM | POA: Diagnosis not present

## 2019-12-18 DIAGNOSIS — M5136 Other intervertebral disc degeneration, lumbar region: Secondary | ICD-10-CM

## 2019-12-18 DIAGNOSIS — R29890 Loss of height: Secondary | ICD-10-CM

## 2019-12-18 NOTE — Progress Notes (Signed)
Name: Richard Mueller   MRN: [id number]    DOB: Oct 21, 1955   Date:12/18/2019       Progress Note  Subjective  Chief Complaint  Chief Complaint  Patient presents with  . Follow-up    HPI   Hyperlipidemia: taking Atorvastatin He denies myalgia or chest pain. Last LDL was 70.  We will recheck labs today   HTN: taking Losartan, he has right anterior chest pain ( seen by Delsa Grana - very mild not associated with sob)  or palpitation. He has seen Dr. Fletcher Anon - had stress test in 2014 because of atypical chest pain and it was normal, on EKG done by Dr. Fletcher Anon there was PVC sinus rhythm. He has follow up in July with him   Metabolic Syndrome:  He went to diabetic teaching class in 2018. He has been eating breakfast and lunch, they grilled at home for dinner, he stopped drinking sweet tea and does not eat after 7 pm .He denies polyphagia, polydipsia and polyuria. He is feeling well. Last hgbA1C was down to 5.5 % , it was 5.9% in the past.    Adrenal adenoma: incidental finding on CT done for evaluation of appendicitis , seen by Dr. Dahlia Byes and was given reassurance, he will go back for repeat CT yearly   Atherosclerosis of aorta: incidental finding on CT abdomen. He is already on statin and aspirin and no claudicication symptoms. Continue current management   Senile purpura: on both arms, left worse than right and I gave him reassurance  DDD spine: he is seeing Dr. Rita Ohara and has steroid injections about every 6 months, there was a loss of height, he is concerned about osteoporosis, he used to be 5'11" and is down to 5'8". He states pain is under control. He is able to work in his yard Pain level right now is zero , but before injections used to be 7-8/10, average is 4-5/10   Patient Active Problem List   Diagnosis Date Noted  . Adrenal incidentaloma ([place]) 12/18/2019  . Basal cell carcinoma, forehead 08/07/2019  . Atherosclerosis of aorta ([place]) 12/06/2018  . Degenerative disc  disease, lumbar 12/06/2018  . Adenoma of left adrenal gland 12/06/2018  . PVC (premature ventricular contraction) 12/20/2016  . History of squamous cell carcinoma of skin 05/26/2015  . Family history of cardiac disorder 03/17/2015  . Gastro-esophageal reflux disease without esophagitis 03/17/2015  . Obesity (BMI 30-39.9) 03/17/2015  . Osteoarthrosis 03/17/2015  . Perennial allergic rhinitis with seasonal variation 03/17/2015  . Hyperlipidemia   . Hypertension   . Dysmetabolic syndrome [id number]    Past Surgical History:  Procedure Laterality Date  . COLONOSCOPY    . LAPAROSCOPIC APPENDECTOMY N/A 11/28/2018   Procedure: APPENDECTOMY LAPAROSCOPIC;  Surgeon: Jules Husbands, MD;  Location: [facility] ORS;  Service: General;  Laterality: N/A;  . ORTHOPEDIC SURGERY    . SKIN CANCER DESTRUCTION Left 03/06/2015   Hand- Dr. Phillip Heal  . SKIN CANCER EXCISION Left 08/2018   Dr. Phillip Heal on Left Arm     Family History  Problem Relation Age of Onset  . Heart disease Mother   . Heart attack Mother   . COPD Mother   . Heart disease Father   . Hypertension Father   . COPD Father   . Cancer Father         Prostate and Skin  . COPD Sister   . Hypertension Daughter   . Heart attack Brother 54       2014  .  Depression Daughter     Social History   Tobacco Use  . Smoking status: Former Smoker    Packs/day: 1.00    Years: 15.00    Pack years: 15.00    Types: Cigarettes    Start date: 08/24/1987    Quit date: 08/26/2002    Years since quitting: 17.3  . Smokeless tobacco: Never Used  Substance Use Topics  . Alcohol use: Yes    Alcohol/week: 0.0 standard drinks    Comment: ocassional     Current Outpatient Medications:  .  Aloe Vera GEL, Apply topically., Disp: , Rfl:  .  Apoaequorin (PREVAGEN PO), Take by mouth., Disp: , Rfl:  .  Ascorbic Acid (VITAMIN C) 1000 MG tablet, Take 500 mg by mouth daily. , Disp: , Rfl:  .  aspirin 81 MG tablet, Take 81 mg by mouth daily., Disp: , Rfl:  .   atorvastatin (LIPITOR) 20 MG tablet, Take 1 tablet (20 mg total) by mouth daily., Disp: 90 tablet, Rfl: 3 .  b complex vitamins tablet, Take 1 tablet by mouth daily., Disp: , Rfl:  .  Calcium 200 MG TABS, Take by mouth., Disp: , Rfl:  .  cholecalciferol (VITAMIN D3) 25 MCG (1000 UNIT) tablet, Take 1,000 Units by mouth daily., Disp: , Rfl:  .  fexofenadine (ALLEGRA) 180 MG tablet, Take 180 mg by mouth as needed. , Disp: , Rfl:  .  Ginkgo Biloba 100 MG CAPS, Take by mouth., Disp: , Rfl:  .  losartan (COZAAR) 50 MG tablet, Take 1 tablet (50 mg total) by mouth daily., Disp: 90 tablet, Rfl: 3 .  Multiple Vitamin (MULTIVITAMIN) tablet, Take 1 tablet by mouth daily., Disp: , Rfl:  .  naproxen (NAPROSYN) 500 MG tablet, Take 500 mg by mouth 2 (two) times daily with a meal., Disp: , Rfl:  .  Omega-3 Fatty Acids (FISH OIL) 1000 MG CAPS, Take by mouth., Disp: , Rfl:   Allergies  Allergen Reactions  . Lisinopril     Rash, pins and needles in arms    I personally reviewed active problem list, medication list, allergies, family history, social history, health maintenance with the patient/caregiver today.   ROS  Constitutional: Negative for fever or weight change.  Respiratory: Negative for cough and shortness of breath.   Cardiovascular: Negative for chest pain or palpitations.  Gastrointestinal: Negative for abdominal pain, no bowel changes.  Musculoskeletal: Negative for gait problem or joint swelling.  Skin: Negative for rash.  Neurological: Negative for dizziness or headache.  No other specific complaints in a complete review of systems (except as listed in HPI above).  Objective  Vitals:   12/18/19 1407  BP: 122/70  Pulse: 83  Resp: 16  Temp: (!) 97.5 F (36.4 C)  TempSrc: Temporal  SpO2: 93%  Weight: 209 lb 12.8 oz (95.2 kg)  Height: 5\' 8"  (1.727 m)    Body mass index is 31.9 kg/m.  Physical Exam  Constitutional: Patient appears well-developed and well-nourished. Obese  No  distress.  HEENT: head atraumatic, normocephalic, pupils equal and reactive to light Cardiovascular: Normal rate, regular rhythm and normal heart sounds.  No murmur heard. No BLE edema. Pulmonary/Chest: Effort normal and breath sounds normal. No respiratory distress. Abdominal: Soft.  There is no tenderness. Psychiatric: Patient has a normal mood and affect. behavior is normal. Judgment and thought content normal.  Recent Results (from the past 2160 hour(s))  Novel Coronavirus, NAA ([facility])     Status: None   Collection Time:  10/09/19  2:10 PM   Specimen: Oropharyngeal(OP) collection in vial transport medium   OROPHARYNGEA  TESTING  Result Value Ref Range   SARS-CoV-2, NAA Not Detected Not Detected    Comment: Testing was performed using the cobas(R) SARS-CoV-2 test. This nucleic acid amplification test was developed and its performance characteristics determined by [employer]. Nucleic acid amplification tests include RT-PCR and TMA. This test has not been FDA cleared or approved. This test has been authorized by FDA under an Emergency Use Authorization (EUA). This test is only authorized for the duration of time the declaration that circumstances exist justifying the authorization of the emergency use of in vitro diagnostic tests for detection of SARS-CoV-2 virus and/or diagnosis of COVID-19 infection under section 564(b)(1) of the Act, 21 U.S.C. [id number]) (1), unless the authorization is terminated or revoked sooner. When diagnostic testing is negative, the possibility of a false negative result should be considered in the context of a patient's recent exposures and the presence of clinical signs and symptoms consistent with COVID-19. An individual without sympto ms of COVID-19 and who is not shedding SARS-CoV-2 virus would expect to have a negative (not detected) result in this assay.      PHQ2/9: Depression screen [facility] 2/9 12/18/2019 11/27/2019 06/08/2019 12/06/2018  11/27/2018  Decreased Interest 0 0 0 0 0  Down, Depressed, Hopeless 0 0 0 0 0  PHQ - 2 Score 0 0 0 0 0  Altered sleeping 0 0 0 0 0  Tired, decreased energy 0 0 0 0 0  Change in appetite 0 0 0 0 0  Feeling bad or failure about yourself  0 0 0 0 0  Trouble concentrating 0 0 0 0 0  Moving slowly or fidgety/restless 0 0 0 0 0  Suicidal thoughts 0 0 0 0 0  PHQ-9 Score 0 0 0 0 0  Difficult doing work/chores - Not difficult at all Not difficult at all - Not difficult at all    phq 9 is negative   Fall Risk: Fall Risk  12/18/2019 11/27/2019 06/13/2019 12/06/2018 11/27/2018  Falls in the past year? 0 0 0 0 0  Number falls in past yr: 0 0 0 0 0  Injury with Fall? 0 0 0 0 0      Functional Status Survey: Is the patient deaf or have difficulty hearing?: No Does the patient have difficulty seeing, even when wearing glasses/contacts?: No Does the patient have difficulty concentrating, remembering, or making decisions?: No Does the patient have difficulty walking or climbing stairs?: No Does the patient have difficulty dressing or bathing?: No Does the patient have difficulty doing errands alone such as visiting a doctor's office or shopping?: No    Assessment & Plan  1. Atherosclerosis of aorta ([facility])  - Lipid panel  2. Adrenal incidentaloma ([place])   3. Essential hypertension  - CBC with Differential/Platelet - COMPLETE METABOLIC PANEL WITH GFR  4. Pure hypercholesteremia  Recheck labs   5. Degenerative disc disease, lumbar   6. Dysmetabolic syndrome  - Hemoglobin A1c  7. Loss of height  - DG Bone Density; Future

## 2019-12-19 LAB — LIPID PANEL
Cholesterol: 162 mg/dL (ref <200)
HDL: 54 mg/dL (ref >=40)
LDL Cholesterol (Calc): 73 mg/dL (calc)
Non-HDL Cholesterol (Calc): 108 mg/dL (calc) (ref <130)
Total CHOL/HDL Ratio: 3 (calc) (ref <5.0)
Triglycerides: 267 mg/dL — ABNORMAL HIGH (ref <150)

## 2019-12-19 LAB — CBC WITH DIFFERENTIAL/PLATELET
Absolute Monocytes: 1114 cells/uL — ABNORMAL HIGH (ref 200–950)
Basophils Absolute: 32 cells/uL (ref 0–200)
Basophils Relative: 0.4 %
Eosinophils Absolute: 24 cells/uL (ref 15–500)
Eosinophils Relative: 0.3 %
HCT: 44.1 % (ref 38.5–50.0)
Hemoglobin: 15.1 g/dL (ref 13.2–17.1)
Lymphs Abs: 1817 cells/uL (ref 850–3900)
MCH: 31.3 pg (ref 27.0–33.0)
MCHC: 34.2 g/dL (ref 32.0–36.0)
MCV: 91.3 fL (ref 80.0–100.0)
MPV: 10.6 fL (ref 7.5–12.5)
Monocytes Relative: 14.1 %
Neutro Abs: 4914 cells/uL (ref 1500–7800)
Neutrophils Relative %: 62.2 %
Platelets: 239 10*3/uL (ref 140–400)
RBC: 4.83 10*6/uL (ref 4.20–5.80)
RDW: 12.7 % (ref 11.0–15.0)
Total Lymphocyte: 23 %
WBC: 7.9 10*3/uL (ref 3.8–10.8)

## 2019-12-19 LAB — HEMOGLOBIN A1C
Hgb A1c MFr Bld: 5.5 % of total Hgb (ref <5.7)
Mean Plasma Glucose: 111 (calc)
eAG (mmol/L): 6.2 (calc)

## 2019-12-19 LAB — COMPLETE METABOLIC PANEL WITH GFR
AG Ratio: 2.1 (calc) (ref 1.0–2.5)
ALT: 18 U/L (ref 9–46)
AST: 16 U/L (ref 10–35)
Albumin: 4.5 g/dL (ref 3.6–5.1)
Alkaline phosphatase (APISO): 49 U/L (ref 35–144)
BUN/Creatinine Ratio: 23 (calc) — ABNORMAL HIGH (ref 6–22)
BUN: 26 mg/dL — ABNORMAL HIGH (ref 7–25)
CO2: 26 mmol/L (ref 20–32)
Calcium: 9.3 mg/dL (ref 8.6–10.3)
Chloride: 101 mmol/L (ref 98–110)
Creat: 1.14 mg/dL (ref 0.70–1.25)
GFR, Est African American: 78 mL/min/{1.73_m2} (ref >=60)
GFR, Est Non African American: 68 mL/min/{1.73_m2} (ref >=60)
Globulin: 2.1 g/dL (calc) (ref 1.9–3.7)
Glucose, Bld: 92 mg/dL (ref 65–99)
Potassium: 4.5 mmol/L (ref 3.5–5.3)
Sodium: 136 mmol/L (ref 135–146)
Total Bilirubin: 0.6 mg/dL (ref 0.2–1.2)
Total Protein: 6.6 g/dL (ref 6.1–8.1)

## 2019-12-20 ENCOUNTER — Ambulatory Visit: Payer: 59 | Admitting: Cardiovascular Disease

## 2020-01-01 ENCOUNTER — Ambulatory Visit
Admission: RE | Admit: 2020-01-01 | Discharge: 2020-01-01 | Disposition: A | Discharge status: Home / Self Care | Payer: 59 | Source: Ambulatory Visit | Attending: Family Medicine | Admitting: Family Medicine

## 2020-01-01 ENCOUNTER — Other Ambulatory Visit: Payer: Self-pay

## 2020-01-01 ENCOUNTER — Encounter: Payer: Self-pay | Admitting: Family Medicine

## 2020-01-01 DIAGNOSIS — R29890 Loss of height: Secondary | ICD-10-CM | POA: Diagnosis not present

## 2020-01-29 ENCOUNTER — Other Ambulatory Visit: Payer: Self-pay | Admitting: Cardiovascular Disease

## 2020-01-29 DIAGNOSIS — E782 Mixed hyperlipidemia: Secondary | ICD-10-CM

## 2020-01-29 DIAGNOSIS — I10 Essential (primary) hypertension: Secondary | ICD-10-CM

## 2020-03-20 ENCOUNTER — Telehealth: Payer: Self-pay | Admitting: Cardiovascular Disease

## 2020-03-20 NOTE — Telephone Encounter (Signed)
Pt c/o of Chest Pain: STAT if CP now or developed within 24 hours  1. Are you having CP right now? No   2. Are you experiencing any other symptoms (ex. SOB, nausea, vomiting, sweating)? no  3. How long have you been experiencing CP?  Several months   4. Is your CP continuous or coming and going? Intermittent   5. Have you taken Nitroglycerin? No   Spoke with patient re : schedule changes .   C/o R nipple area chest pressure increasing in frequency  Brother just had cabg x 3 and used to smoke   Moved visit to 8/13 at 930 added to wait list  ?

## 2020-03-20 NOTE — Telephone Encounter (Addendum)
Spoke with the patient. Patients sts that he has been having intermittent right sided chest pressure(over his right breast)  for 6 months. Patient sts that the chest pressure is not associated with activity and occurs randomly. The pressure resolves on its own. Patients denies sob, n/v, diaphoresis, palpitations, swelling, light headiness, presyncope or syncope.  Adv the patient to keep his 04/04/20 appt. Adv him that he is placed on the wait list for a sooner appt if there is a cancellation. Adv the patient to contact the office in the interim if symptoms worsen.  Patient verbalized understanding and voiced appreciation for the call.

## 2020-03-21 ENCOUNTER — Other Ambulatory Visit: Payer: Self-pay

## 2020-03-21 ENCOUNTER — Encounter: Payer: Self-pay | Admitting: Physician Assistant

## 2020-03-21 ENCOUNTER — Ambulatory Visit: Payer: 59 | Admitting: Physician Assistant

## 2020-03-21 VITALS — BP 126/72 | HR 94 | Ht 68.5 in | Wt 212.0 lb

## 2020-03-21 DIAGNOSIS — E782 Mixed hyperlipidemia: Secondary | ICD-10-CM

## 2020-03-21 DIAGNOSIS — I10 Essential (primary) hypertension: Secondary | ICD-10-CM

## 2020-03-21 DIAGNOSIS — R072 Precordial pain: Secondary | ICD-10-CM

## 2020-03-21 DIAGNOSIS — R002 Palpitations: Secondary | ICD-10-CM | POA: Diagnosis not present

## 2020-03-21 NOTE — Progress Notes (Signed)
Office Visit    Patient Name: Richard Mueller Date of Encounter: 03/24/2020  Primary Care Provider:  Steele Sizer, MD Primary Cardiologist:  No primary care provider on file.  Chief Complaint    No chief complaint on file.   64 yo male with history of HTN, HLD, family history of CAD, and who presents for further evaluation of atyical CP.  Past Medical History    Past Medical History:  Diagnosis Date  . Allergic rhinitis, cause unspecified   . Allergy   . Bronchitis   . Esophageal reflux   . GERD (gastroesophageal reflux disease)   . Hyperlipidemia   . Hypertension   . Metabolic syndrome   . Obesity, unspecified   . Osteoarthritis   . Osteoarthrosis, unspecified whether generalized or localized, unspecified site   . Right upper quadrant pain    Past Surgical History:  Procedure Laterality Date  . COLONOSCOPY    . LAPAROSCOPIC APPENDECTOMY N/A 11/28/2018   Procedure: APPENDECTOMY LAPAROSCOPIC;  Surgeon: Jules Husbands, MD;  Location: [facility] ORS;  Service: General;  Laterality: N/A;  . ORTHOPEDIC SURGERY    . SKIN CANCER DESTRUCTION Left 03/06/2015   Hand- Dr. Phillip Heal  . SKIN CANCER EXCISION Left 08/2018   Dr. Phillip Heal on Left Arm     Allergies  Allergies  Allergen Reactions  . Lisinopril     Rash, pins and needles in arms    History of Present Illness    Richard Mueller is a 64 y.o. male with PMH as above.  He reports 17 years as a smoker. He has a history of hypertension, hyperlipidemia, and no previous cardiac history.  He does have a history of family CAD.  He reports that his mother had a heart attack at age 29 yo, deceased of a heart attack at age 76.  His father has 6 stents.  His brother just recently underwent open heart surgery with triple bypass.  He was evaluated in 2014 for atypical chest pain.  He underwent treadmill nuclear stress test without evidence of ischemia and normal EF.    When last seen in clinic 12/2018, he was  taking his medication regularly.  You denied any symptoms.  He had appendicitis the previous month and underwent successful appendectomy without complications.  CT of the abdomen showed aortic atherosclerosis but without obstructive disease or aneurysm.  He presents to clinic today and reports significant stress at work with associated intermittent chest pain.  He reports right sided CP/pressure for several months that lasts 1-2 minutes and occurs with both rest and exertion. CP is described as different from that of his previously reported CP. Associated sx include fluttering and SOB/DOE. 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  He reports significant anxiety surrounding his familial history of CAD and recent symptoms. Most recently, his brother of 2 years younger underwent triple bypass.  He recently quit drinking, previously drinking beer 3 nights a week.  He quit smoking in January 2004. He denies pnd, orthopnea, n, v, dizziness, syncope, edema, weight gain, or early satiety. He denies a regular workout routine. He does note that he is able to stay active around the home, including weeding up to 6 hours on his [place] acres.   He reports an upcoming trip August 13th, during which time he intends to travel to [employer] [place].   Home Medications    Prior to Admission medications  Medication Sig Start Date End Date Taking? Authorizing Provider  Aloe Vera GEL Apply topically.   Yes [provider]  Apoaequorin (PREVAGEN PO) Take by mouth.   Yes [provider]  Ascorbic Acid (VITAMIN C) 1000 MG tablet Take 500 mg by mouth daily.    Yes [provider]  aspirin 81 MG tablet Take 81 mg by mouth daily.   Yes [provider]  atorvastatin (LIPITOR) 20 MG tablet TAKE 1 TABLET BY MOUTH DAILY. 01/29/20  Yes Wellington Hampshire, MD  b complex vitamins tablet Take 1 tablet by mouth daily.   Yes [provider]    Calcium 200 MG TABS Take by mouth.   Yes [provider]  cholecalciferol (VITAMIN D3) 25 MCG (1000 UNIT) tablet Take 1,000 Units by mouth daily.   Yes [provider]  fexofenadine (ALLEGRA) 180 MG tablet Take 180 mg by mouth as needed.    Yes [provider]  Ginkgo Biloba 100 MG CAPS Take by mouth.   Yes [provider]  losartan (COZAAR) 50 MG tablet TAKE 1 TABLET BY MOUTH DAILY. 01/29/20  Yes Wellington Hampshire, MD  Multiple Vitamin (MULTIVITAMIN) tablet Take 1 tablet by mouth daily.   Yes [provider]  naproxen (NAPROSYN) 500 MG tablet Take 500 mg by mouth 2 (two) times daily with a meal.   Yes [provider]  Omega-3 Fatty Acids (FISH OIL) 1000 MG CAPS Take by mouth.   Yes [provider]    Review of Systems    He denies pnd, orthopnea, n, v, dizziness, syncope, edema, weight gain, or early satiety. He reports CP, palpitations, and SOB/DOE.   All other systems reviewed and are otherwise negative except as noted above.  Physical Exam    VS:  BP 126/72   Pulse 94   Ht 5' 8.5" (1.74 m)   Wt (!) 212 lb (96.2 kg)   SpO2 97%   BMI 31.77 kg/m  , BMI Body mass index is 31.77 kg/m. GEN: Well nourished, well developed, in no acute distress. HEENT: normal. Neck: Supple, no JVD, carotid bruits, or masses. Cardiac: RRR, no murmurs, rubs, or gallops. No clubbing, cyanosis, edema.  Radials/DP/PT 2+ and equal bilaterally.  Respiratory:  Respirations regular and unlabored, clear to auscultation bilaterally. GI: Soft, nontender, nondistended, BS + x 4. MS: no deformity or atrophy. Skin: warm and dry, no rash. Neuro:  Strength and sensation are intact. Psych: Normal affect.  Accessory Clinical Findings    ECG personally reviewed by me today - NSR, 89bpm, IVCD, LAFB, LVH - no acute changes.  VITALS Reviewed today   Temp Readings from Last 3 Encounters:  12/18/19 (!) 97.5 F (36.4 C) (Temporal)  11/27/19 98.3 F (36.8  C)  06/13/19 (!) 97.3 F (36.3 C) (Temporal)   BP Readings from Last 3 Encounters:  03/21/20 126/72  12/18/19 122/70  11/27/19 118/70   Pulse Readings from Last 3 Encounters:  03/21/20 94  12/18/19 83  11/27/19 60    Wt Readings from Last 3 Encounters:  03/21/20 (!) 212 lb (96.2 kg)  12/18/19 209 lb 12.8 oz (95.2 kg)  11/27/19 211 lb 14.4 oz (96.1 kg)     LABS  reviewed today   Lab Results  Component Value Date   WBC 7.9 12/18/2019   HGB 15.1 12/18/2019   HCT 44.1 12/18/2019   MCV 91.3 12/18/2019   PLT 239 12/18/2019   Lab Results  Component Value Date   CREATININE  1.08 03/21/2020   BUN 15 03/21/2020   NA 139 03/21/2020   K 3.9 03/21/2020   CL 100 03/21/2020   CO2 22 03/21/2020   Lab Results  Component Value Date   ALT 18 12/18/2019   AST 16 12/18/2019   ALKPHOS 57 11/27/2018   BILITOT 0.6 12/18/2019   Lab Results  Component Value Date   CHOL 162 12/18/2019   HDL 54 12/18/2019   LDLCALC 73 12/18/2019   TRIG 267 (H) 12/18/2019   CHOLHDL 3.0 12/18/2019    Lab Results  Component Value Date   HGBA1C 5.5 12/18/2019   Lab Results  Component Value Date   TSH 1.120 05/26/2015     STUDIES/PROCEDURES reviewed today   2014 Lexiscan Nl study Summary   1. Clinical correlation is recommended.   2. Exercise myocardial perfusion study with no significant ischemia.   3. No significant wall motion abnormality noted.   4. Overall, this is a Low risk scan.   5. The estimated ejection fraction is 61%.   6. The left ventricular global function was normal.   7. There are no EKG changes concerning for ischemia.   8. There is a fixed inferior wall defect with normal wall motion likely  attenutation.   9. There is attenuation artifact noted on this study.   Assessment & Plan    Atypical CP Family history of CAD --As above in HPI. Reports CP and SOB/DOE with palpitations.  EKG without acute ST/T changes. Ordered echo and updated stress test. Further  recommendations at that time  Palpitations --2 week Zio monitor.   HLD --LDL goal < 70. Continue statin  HTN --Continue current medications  Aortic atherosclerosis --Incidental finding on CT. Medical therapy.  RTC 6 weeks   Arvil Chaco, PA-C

## 2020-03-21 NOTE — Patient Instructions (Addendum)
Medication Instructions:  - Your physician recommends that you continue on your current medications as directed. Please refer to the Current Medication list given to you today.  *If you need a refill on your cardiac medications before your next appointment, please call your pharmacy*   Lab Work: - Your physician recommends that you have lab work today: BMP/ Magnesium   If you have labs (blood work) drawn today and your tests are completely normal, you will receive your results only by: Marland Kitchen MyChart Message (if you have MyChart) OR . A paper copy in the mail If you have any lab test that is abnormal or we need to change your treatment, we will call you to review the results.   Testing/Procedures:  1) Lexiscan Myoview (Chemical Stress Test): (before 04/04/20)  - Your physician has requested that you have a lexiscan myoview.   [place]  Your caregiver has ordered a Stress Test with nuclear imaging. The purpose of this test is to evaluate the blood supply to your heart muscle. This procedure is referred to as a "Non-Invasive Stress Test." This is because other than having an IV started in your vein, nothing is inserted or "invades" your body. Cardiac stress tests are done to find areas of poor blood flow to the heart by determining the extent of coronary artery disease (CAD). Some patients exercise on a treadmill, which naturally increases the blood flow to your heart, while others who are  unable to walk on a treadmill due to physical limitations have a pharmacologic/chemical stress agent called Lexiscan . This medicine will mimic walking on a treadmill by temporarily increasing your coronary blood flow.   Please note: these test may take anywhere between 2-4 hours to complete  PLEASE REPORT TO [place] AT THE FIRST DESK WILL DIRECT YOU WHERE TO GO  Date of Procedure:_____________________________________  Arrival Time for  Procedure:______________________________  Instructions regarding medication:   __x__ : You may take all of your regular medications the morning of your test with enough water to get them down safely.  PLEASE NOTIFY THE OFFICE AT LEAST 2 HOURS IN ADVANCE IF YOU ARE UNABLE TO KEEP YOUR APPOINTMENT.  [phone] AND  PLEASE NOTIFY NUCLEAR MEDICINE AT [facility] AT LEAST 24 HOURS IN ADVANCE IF YOU ARE UNABLE TO KEEP YOUR APPOINTMENT. [phone]  How to prepare for your Myoview test:  1. Do not eat or drink after midnight 2. No caffeine for 24 hours prior to test 3. No smoking 24 hours prior to test. 4. Your medication may be taken with water.  If your doctor stopped a medication because of this test, do not take that medication. 5. Ladies, please do not wear dresses.  Skirts or pants are appropriate. Please wear a short sleeve shirt. 6. No perfume, cologne or lotion. 7. Wear comfortable walking shoes. No heels!   2) ZIO XT (14 day heart monitor): (schedule to be placed the day of the stress test, in office, after the stress test is completed)  - Your physician has recommended that you wear a Zio monitor. This monitor is a medical device that records the heart's electrical activity. Doctors most often use these monitors to diagnose arrhythmias. Arrhythmias are problems with the speed or rhythm of the heartbeat. The monitor is a small device applied to your chest. You can wear one while you do your normal daily activities. While wearing this monitor if you have any symptoms to push the button and record what you  felt. Once you have worn this monitor for the period of time provider prescribed (Usually 14 days), you will return the monitor device in the postage paid box. Once it is returned they will download the data collected and provide [place] with a report which the provider will then review and we will call you with those results. Important tips:  1. Avoid showering during the first 24 hours of  wearing the monitor. 2. Avoid excessive sweating to help maximize wear time. 3. Do not submerge the device, no hot tubs, and no swimming pools. 4. Keep any lotions or oils away from the patch. 5. After 24 hours you may shower with the patch on. Take brief showers with your back facing the shower head.  6. Do not remove patch once it has been placed because that will interrupt data and decrease adhesive wear time. 7. Push the button when you have any symptoms and write down what you were feeling. 8. Once you have completed wearing your monitor, remove and place into box which has postage paid and place in your outgoing mailbox.  9. If for some reason you have misplaced your box then call our office and we can provide another box and/or mail it off for you.        3) Echocardiogram:  - Your physician has requested that you have an echocardiogram (just before the monitor is placed or just after the 14 day-wear period) Echocardiography is a painless test that uses sound waves to create images of your heart. It provides your doctor with information about the size and shape of your heart and how well your heart's chambers and valves are working. This procedure takes approximately one hour. There are no restrictions for this procedure. An IV may need to be started during your test to inject an image enhancing agent for more optimal pictures of your hear. Please drink some water prior to the test to hydrate your veins in case the IV is needed.    Follow-Up: At [facility], you and your health needs are our priority.  As part of our continuing mission to provide you with exceptional heart care, we have created designated Provider Care Teams.  These Care Teams include your primary Cardiologist (physician) and Advanced Practice Providers (APPs -  Physician Assistants and Nurse Practitioners) who all work together to provide you with the care you need, when you need it.  We recommend signing up for  the patient portal called "MyChart".  Sign up information is provided on this After Visit Summary.  MyChart is used to connect with patients for Virtual Visits (Telemedicine).  Patients are able to view lab/test results, encounter notes, upcoming appointments, etc.  Non-urgent messages can be sent to your provider as well.   To learn more about what you can do with MyChart, go to [email or web address].    Your next appointment:   6 week(s)  The format for your next appointment:   In Person  Provider:    You may see Kathlyn Sacramento, MD or one of the following Advanced Practice Providers on your designated Care Team:    Murray Hodgkins, NP  Christell Faith, PA-C  Marrianne Mood, PA-C    Other Instructions    Cardiac Nuclear Scan A cardiac nuclear scan is a test that measures blood flow to the heart when a person is resting and when he or she is exercising. The test looks for problems such as:  Not enough blood reaching a portion of the heart.  The heart muscle not working normally. You may need this test if:  You have heart disease.  You have had abnormal lab results.  You have had heart surgery or a balloon procedure to open up blocked arteries (angioplasty).  You have chest pain.  You have shortness of breath. In this test, a radioactive dye (tracer) is injected into your bloodstream. After the tracer has traveled to your heart, an imaging device is used to measure how much of the tracer is absorbed by or distributed to various areas of your heart. This procedure is usually done at a hospital and takes 2-4 hours. Tell a health care provider about:  Any allergies you have.  All medicines you are taking, including vitamins, herbs, eye drops, creams, and over-the-counter medicines.  Any problems you or family members have had with anesthetic medicines.  Any blood disorders you have.  Any surgeries you have had.  Any medical conditions you have.  Whether you are  pregnant or may be pregnant. What are the risks? Generally, this is a safe procedure. However, problems may occur, including:  Serious chest pain and heart attack. This is only a risk if the stress portion of the test is done.  Rapid heartbeat.  Sensation of warmth in your chest. This usually passes quickly.  Allergic reaction to the tracer. What happens before the procedure?  Ask your health care provider about changing or stopping your regular medicines. This is especially important if you are taking diabetes medicines or blood thinners.  Follow instructions from your health care provider about eating or drinking restrictions.  Remove your jewelry on the day of the procedure. What happens during the procedure?  An IV will be inserted into one of your veins.  Your health care provider will inject a small amount of radioactive tracer through the IV.  You will wait for 20-40 minutes while the tracer travels through your bloodstream.  Your heart activity will be monitored with an electrocardiogram (ECG).  You will lie down on an exam table.  Images of your heart will be taken for about 15-20 minutes.  You may also have a stress test. For this test, one of the following may be done: ? You will exercise on a treadmill or stationary bike. While you exercise, your heart's activity will be monitored with an ECG, and your blood pressure will be checked. ? You will be given medicines that will increase blood flow to parts of your heart. This is done if you are unable to exercise.  When blood flow to your heart has peaked, a tracer will again be injected through the IV.  After 20-40 minutes, you will get back on the exam table and have more images taken of your heart.  Depending on the type of tracer used, scans may need to be repeated 3-4 hours later.  Your IV line will be removed when the procedure is over. The procedure may vary among health care providers and hospitals. What  happens after the procedure?  Unless your health care provider tells you otherwise, you may return to your normal schedule, including diet, activities, and medicines.  Unless your health care provider tells you otherwise, you may increase your fluid intake. This will help to flush the contrast dye from your body. Drink enough fluid to keep your urine pale yellow.  Ask your health care provider, or the department that is doing the test: ? When will my results be ready? ? How will I get my results? Summary  A cardiac nuclear scan measures the blood flow to the heart when a person is resting and when he or she is exercising.  Tell your health care provider if you are pregnant.  Before the procedure, ask your health care provider about changing or stopping your regular medicines. This is especially important if you are taking diabetes medicines or blood thinners.  After the procedure, unless your health care provider tells you otherwise, increase your fluid intake. This will help flush the contrast dye from your body.  After the procedure, unless your health care provider tells you otherwise, you may return to your normal schedule, including diet, activities, and medicines. This information is not intended to replace advice given to you by your health care provider. Make sure you discuss any questions you have with your health care provider. Document Revised: 01/23/2018 Document Reviewed: 01/23/2018 Elsevier Patient Education  [place].    Echocardiogram An echocardiogram is a procedure that uses painless sound waves (ultrasound) to produce an image of the heart. Images from an echocardiogram can provide important information about:  Signs of coronary artery disease (CAD).  Aneurysm detection. An aneurysm is a weak or damaged part of an artery wall that bulges out from the normal force of blood pumping through the body.  Heart size and shape. Changes in the size or shape of the  heart can be associated with certain conditions, including heart failure, aneurysm, and CAD.  Heart muscle function.  Heart valve function.  Signs of a past heart attack.  Fluid buildup around the heart.  Thickening of the heart muscle.  A tumor or infectious growth around the heart valves. Tell a health care provider about:  Any allergies you have.  All medicines you are taking, including vitamins, herbs, eye drops, creams, and over-the-counter medicines.  Any blood disorders you have.  Any surgeries you have had.  Any medical conditions you have.  Whether you are pregnant or may be pregnant. What are the risks? Generally, this is a safe procedure. However, problems may occur, including:  Allergic reaction to dye (contrast) that may be used during the procedure. What happens before the procedure? No specific preparation is needed. You may eat and drink normally. What happens during the procedure?   An IV tube may be inserted into one of your veins.  You may receive contrast through this tube. A contrast is an injection that improves the quality of the pictures from your heart.  A gel will be applied to your chest.  A wand-like tool (transducer) will be moved over your chest. The gel will help to transmit the sound waves from the transducer.  The sound waves will harmlessly bounce off of your heart to allow the heart images to be captured in real-time motion. The images will be recorded on a computer. The procedure may vary among health care providers and hospitals. What happens after the procedure?  You may return to your normal, everyday life, including diet, activities, and medicines, unless your health care provider tells you not to do that. Summary  An echocardiogram is a procedure that uses painless sound waves (ultrasound) to produce an image of the heart.  Images from an echocardiogram can provide important information about the size and shape of your  heart, heart muscle function, heart valve function, and fluid buildup around your heart.  You do not need to do anything to prepare before this procedure. You may eat and drink normally.  After the echocardiogram is completed, you  may return to your normal, everyday life, unless your health care provider tells you not to do that. This information is not intended to replace advice given to you by your health care provider. Make sure you discuss any questions you have with your health care provider. Document Revised: 11/30/2018 Document Reviewed: 09/11/2016 Elsevier Patient Education  [place].

## 2020-03-22 LAB — BASIC METABOLIC PANEL
BUN/Creatinine Ratio: 14 (ref 10–24)
BUN: 15 mg/dL (ref 8–27)
CO2: 22 mmol/L (ref 20–29)
Calcium: 9.3 mg/dL (ref 8.6–10.2)
Chloride: 100 mmol/L (ref 96–106)
Creatinine, Ser: 1.08 mg/dL (ref 0.76–1.27)
GFR calc Af Amer: 83 mL/min/{1.73_m2} (ref >59)
GFR calc non Af Amer: 72 mL/min/{1.73_m2} (ref >59)
Glucose: 113 mg/dL — ABNORMAL HIGH (ref 65–99)
Potassium: 3.9 mmol/L (ref 3.5–5.2)
Sodium: 139 mmol/L (ref 134–144)

## 2020-03-22 LAB — MAGNESIUM: Magnesium: 2.2 mg/dL (ref 1.6–2.3)

## 2020-03-25 ENCOUNTER — Telehealth: Payer: Self-pay | Admitting: Physician Assistant

## 2020-03-25 NOTE — Telephone Encounter (Signed)
Patient medication list updated. FYI update fwd to Marrianne Mood, [place]

## 2020-03-25 NOTE — Telephone Encounter (Signed)
Patient calling in stating that he is taking 400 mg of magnesium oxide every other day. Patient did not mention this to [place] but after the blood work he remembered to call in.

## 2020-03-26 NOTE — Telephone Encounter (Signed)
Noted  

## 2020-03-27 ENCOUNTER — Ambulatory Visit: Payer: 59 | Admitting: Cardiovascular Disease

## 2020-03-28 ENCOUNTER — Ambulatory Visit (INDEPENDENT_AMBULATORY_CARE_PROVIDER_SITE_OTHER): Payer: 59

## 2020-03-28 ENCOUNTER — Ambulatory Visit
Admission: RE | Admit: 2020-03-28 | Discharge: 2020-03-28 | Disposition: A | Discharge status: Home / Self Care | Payer: 59 | Source: Ambulatory Visit | Attending: Physician Assistant | Admitting: Physician Assistant

## 2020-03-28 ENCOUNTER — Other Ambulatory Visit: Payer: Self-pay

## 2020-03-28 DIAGNOSIS — R002 Palpitations: Secondary | ICD-10-CM

## 2020-03-28 DIAGNOSIS — R072 Precordial pain: Secondary | ICD-10-CM

## 2020-03-28 LAB — NM MYOCAR MULTI W/SPECT W/WALL MOTION / EF
LV dias vol: 184 mL (ref 62–150)
LV sys vol: 69 mL
Peak HR: 96 {beats}/min
Percent HR: 61 %
Rest HR: 56 {beats}/min
TID: 1.2

## 2020-03-28 MED ORDER — TECHNETIUM TC 99M TETROFOSMIN IV KIT
30.0000 | PACK | Freq: Once | INTRAVENOUS | Status: AC | PRN
  Administered 2020-03-28: 31.5 via INTRAVENOUS

## 2020-03-28 MED ORDER — REGADENOSON 0.4 MG/5ML IV SOLN
0.4000 mg | Freq: Once | INTRAVENOUS | Status: AC
  Administered 2020-03-28: 0.4 mg via INTRAVENOUS
  Filled 2020-03-28: qty 5

## 2020-03-28 MED ORDER — TECHNETIUM TC 99M TETROFOSMIN IV KIT
10.5990 | PACK | Freq: Once | INTRAVENOUS | Status: AC | PRN
  Administered 2020-03-28: 10.599 via INTRAVENOUS

## 2020-03-31 ENCOUNTER — Telehealth: Payer: Self-pay | Admitting: Physician Assistant

## 2020-03-31 NOTE — Telephone Encounter (Signed)
Call to patient to make him aware that Richard Mueller is still being reviewed by cardiologist.   No answer, left detailed message with this information.   I let pt know that we will call back with results when they are finalized.

## 2020-03-31 NOTE — Telephone Encounter (Signed)
Please call to discuss stress test resutls.

## 2020-04-02 ENCOUNTER — Telehealth: Payer: Self-pay | Admitting: Physician Assistant

## 2020-04-02 NOTE — Telephone Encounter (Signed)
° ° °  Please call with stress test results °

## 2020-04-03 ENCOUNTER — Telehealth: Payer: Self-pay

## 2020-04-03 DIAGNOSIS — R072 Precordial pain: Secondary | ICD-10-CM

## 2020-04-03 MED ORDER — METOPROLOL TARTRATE 100 MG PO TABS
100.0000 mg | ORAL_TABLET | Freq: Once | ORAL | 0 refills | Status: DC
Start: 2020-04-03 — End: 2020-05-07

## 2020-04-03 NOTE — Telephone Encounter (Signed)
Called and spoke with patient regarding Jacquelyn Visser's result note from his Myoview. Patient wants to proceed with the CTA scan. I put orders in and will send a copy of the below instructions we discussed on the phone through [place]. Patient will be out of town from 8/13-8/28.    [facility] [address] [place], [place] [address] [phone]   At [facility], please arrive 15 mins early for check-in and test prep.  Please follow these instructions carefully (unless otherwise directed):  Hold all erectile dysfunction medications at least 3 days (72 hrs) prior to test.  On the Night Before the Test:  Be sure to Drink plenty of water.  Do not consume any caffeinated/decaffeinated beverages or chocolate 12 hours prior to your test.  Do not take any antihistamines 12 hours prior to your test.  On the Day of the Test:  Drink plenty of water. Do not drink any water within one hour of the test.  Do not eat any food 4 hours prior to the test.  You may take your regular medications prior to the test.   Take metoprolol (Lopressor) two hours prior to test.      After the Test:  Drink plenty of water.  After receiving IV contrast, you may experience a mild flushed feeling. This is normal.  On occasion, you may experience a mild rash up to 24 hours after the test. This is not dangerous. If this occurs, you can take Benadryl 25 mg and increase your fluid intake.  If you experience trouble breathing, this can be serious. If it is severe call 911 IMMEDIATELY. If it is mild, please call our office.  If you take any of these medications: Glipizide/Metformin, Avandament, Glucavance, please do not take 48 hours after completing test unless otherwise instructed.   Once we have confirmed authorization from your insurance company, we will call you to set up a date and time for your test. Based on how quickly your  insurance processes prior authorizations requests, please allow up to 4 weeks to be contacted for scheduling your Cardiac CT appointment. Be advised that routine Cardiac CT appointments could be scheduled as many as 8 weeks after your provider has ordered it.  For non-scheduling related questions, please contact the cardiac imaging nurse navigator should you have any questions/concerns: Marchia Bond, Cardiac Imaging Nurse Navigator Burley Saver, Interim Cardiac Imaging Nurse [place] [facility] Direct Office Dial: [phone]   For scheduling needs, including cancellations and rescheduling, please call Vivien Rota at [phone], option 3.

## 2020-04-03 NOTE — Telephone Encounter (Signed)
Patient had a msg that JVisser would talk to him today about results

## 2020-04-04 ENCOUNTER — Ambulatory Visit: Payer: 59 | Admitting: Family

## 2020-04-21 ENCOUNTER — Other Ambulatory Visit: Payer: Self-pay

## 2020-04-21 ENCOUNTER — Ambulatory Visit (INDEPENDENT_AMBULATORY_CARE_PROVIDER_SITE_OTHER): Payer: 59

## 2020-04-21 DIAGNOSIS — R072 Precordial pain: Secondary | ICD-10-CM

## 2020-04-21 DIAGNOSIS — Z01812 Encounter for preprocedural laboratory examination: Secondary | ICD-10-CM

## 2020-04-21 NOTE — Progress Notes (Signed)
BMP for pre procedure CTA

## 2020-04-23 LAB — ECHOCARDIOGRAM COMPLETE
AR max vel: 3.3 cm2
AV Peak grad: 6 mmHg
Ao pk vel: 1.22 m/s
Area-P 1/2: 3.37 cm2
Calc EF: 57.9 %
S' Lateral: 3.1 cm
Single Plane A2C EF: 56 %
Single Plane A4C EF: 58.3 %

## 2020-04-24 ENCOUNTER — Other Ambulatory Visit
Admission: RE | Admit: 2020-04-24 | Discharge: 2020-04-24 | Disposition: A | Discharge status: Home / Self Care | Payer: 59 | Source: Ambulatory Visit | Attending: Cardiology | Admitting: Cardiology

## 2020-04-24 ENCOUNTER — Other Ambulatory Visit: Payer: Self-pay

## 2020-04-24 DIAGNOSIS — Z01812 Encounter for preprocedural laboratory examination: Secondary | ICD-10-CM

## 2020-04-24 LAB — BASIC METABOLIC PANEL
Anion gap: 7 (ref 5–15)
BUN: 15 mg/dL (ref 8–23)
CO2: 26 mmol/L (ref 22–32)
Calcium: 9.2 mg/dL (ref 8.9–10.3)
Chloride: 103 mmol/L (ref 98–111)
Creatinine, Ser: 0.88 mg/dL (ref 0.61–1.24)
GFR calc Af Amer: >60 mL/min (ref >60)
GFR calc non Af Amer: >60 mL/min (ref >60)
Glucose, Bld: 97 mg/dL (ref 70–99)
Potassium: 4.2 mmol/L (ref 3.5–5.1)
Sodium: 136 mmol/L (ref 135–145)

## 2020-04-25 ENCOUNTER — Other Ambulatory Visit: Payer: Self-pay | Admitting: Cardiovascular Disease

## 2020-04-25 DIAGNOSIS — I10 Essential (primary) hypertension: Secondary | ICD-10-CM

## 2020-04-25 DIAGNOSIS — E782 Mixed hyperlipidemia: Secondary | ICD-10-CM

## 2020-04-30 ENCOUNTER — Telehealth (HOSPITAL_COMMUNITY): Payer: Self-pay | Admitting: Emergency Medicine

## 2020-04-30 NOTE — Telephone Encounter (Signed)
Reaching out to patient to offer assistance regarding upcoming cardiac imaging study; pt verbalizes understanding of appt date/time, parking situation and where to check in, pre-test NPO status and medications ordered, and verified current allergies; name and call back number provided for further questions should they arise Marchia Bond RN Navigator Cardiac Imaging [facility] [phone] office [phone] cell   holding losartan, taking lopressor 2 hr prior to scan

## 2020-05-01 ENCOUNTER — Other Ambulatory Visit: Payer: Self-pay

## 2020-05-01 ENCOUNTER — Ambulatory Visit
Admission: RE | Admit: 2020-05-01 | Discharge: 2020-05-01 | Disposition: A | Discharge status: Home / Self Care | Payer: 59 | Source: Ambulatory Visit | Attending: Physician Assistant | Admitting: Physician Assistant

## 2020-05-01 DIAGNOSIS — R072 Precordial pain: Secondary | ICD-10-CM

## 2020-05-01 MED ORDER — IOHEXOL 350 MG/ML SOLN
100.0000 mL | Freq: Once | INTRAVENOUS | Status: AC | PRN
  Administered 2020-05-01: 100 mL via INTRAVENOUS

## 2020-05-01 MED ORDER — NITROGLYCERIN 0.4 MG SL SUBL
0.8000 mg | SUBLINGUAL_TABLET | Freq: Once | SUBLINGUAL | Status: AC
  Administered 2020-05-01: 0.8 mg via SUBLINGUAL

## 2020-05-05 ENCOUNTER — Telehealth: Payer: Self-pay

## 2020-05-05 NOTE — Telephone Encounter (Signed)
-----   Message from Arvil Chaco, PA-C sent at 05/04/2020  6:50 PM EDT ----- Results show a high coronary calcium score of 857, placing him at the 91st percentile for age and sex matched  control. Let's reassess his symptoms at our next follow-up. We will also use that visit to ensure we control risk factors.  We are still waiting for the FFR score, which will provide [place] with more information regarding his coronary calcium score and is usually submitted separately.

## 2020-05-05 NOTE — Telephone Encounter (Signed)
Visser, Jacquelyn D, PA-C  P Cv Div [place] Triage Monitor showed mainly a normal heart rhythm with average HR 71bpm.   1 run of a faster heart beat that was thought to most likely be SVT (a faster heart rhythm from the top part of the heart) with aberrancy though a heart rhythm from the bottom part of the heart (nonsustained ventricular tachycardia) could not be excluded.   He had 6 episodes of SVT, the longest of which was 16 beats. He had rare extra beats from the top and bottom part of the heart. His triggered events were not at the time of an arrhythmia.   Let's discuss this again at our upcoming clinic visit and answer any questions he may have at that time.    Call to patient to review test results.    Pt verbalized understanding and has no further questions at this time.    Advised pt to call for any further questions or concerns.  No further orders.  Confirmed appt later this week.

## 2020-05-07 ENCOUNTER — Other Ambulatory Visit: Payer: Self-pay | Admitting: Physician Assistant

## 2020-05-07 ENCOUNTER — Encounter: Payer: Self-pay | Admitting: Physician Assistant

## 2020-05-07 ENCOUNTER — Ambulatory Visit: Payer: 59 | Admitting: Physician Assistant

## 2020-05-07 ENCOUNTER — Other Ambulatory Visit: Payer: Self-pay

## 2020-05-07 VITALS — BP 120/78 | HR 88 | Ht 68.0 in | Wt 213.0 lb

## 2020-05-07 DIAGNOSIS — R002 Palpitations: Secondary | ICD-10-CM

## 2020-05-07 DIAGNOSIS — Z87891 Personal history of nicotine dependence: Secondary | ICD-10-CM

## 2020-05-07 DIAGNOSIS — E782 Mixed hyperlipidemia: Secondary | ICD-10-CM

## 2020-05-07 DIAGNOSIS — Z8249 Family history of ischemic heart disease and other diseases of the circulatory system: Secondary | ICD-10-CM

## 2020-05-07 DIAGNOSIS — R072 Precordial pain: Secondary | ICD-10-CM | POA: Diagnosis not present

## 2020-05-07 DIAGNOSIS — E669 Obesity, unspecified: Secondary | ICD-10-CM

## 2020-05-07 DIAGNOSIS — I471 Supraventricular tachycardia: Secondary | ICD-10-CM

## 2020-05-07 DIAGNOSIS — I10 Essential (primary) hypertension: Secondary | ICD-10-CM

## 2020-05-07 DIAGNOSIS — I700 Atherosclerosis of aorta: Secondary | ICD-10-CM

## 2020-05-07 DIAGNOSIS — E785 Hyperlipidemia, unspecified: Secondary | ICD-10-CM

## 2020-05-07 DIAGNOSIS — R0789 Other chest pain: Secondary | ICD-10-CM | POA: Diagnosis not present

## 2020-05-07 DIAGNOSIS — I493 Ventricular premature depolarization: Secondary | ICD-10-CM

## 2020-05-07 DIAGNOSIS — I491 Atrial premature depolarization: Secondary | ICD-10-CM

## 2020-05-07 MED ORDER — CARVEDILOL 3.125 MG PO TABS: 3.1250 mg | ORAL_TABLET | Freq: Two times a day (BID) | ORAL | 3 refills | Status: DC

## 2020-05-07 MED ORDER — ATORVASTATIN CALCIUM 40 MG PO TABS: 40.0000 mg | ORAL_TABLET | Freq: Every day | ORAL | 3 refills | Status: DC

## 2020-05-07 NOTE — Progress Notes (Signed)
Office Visit    Patient Name: Richard Mueller [facility] Mueller Date of Encounter: 05/07/2020  Primary Care Provider:  Steele Sizer, MD Primary Cardiologist:  Kathlyn Sacramento, MD  Chief Complaint    Chief Complaint  Patient presents with  . office visit    F/U after testing; Meds verbally reviewed with patient.    64 yo male with history of HTN, HLD, family history of CAD, and who presents for follow-up and review of cardiovascular testing performed for atypical chest pain.  Past Medical History    Past Medical History:  Diagnosis Date  . Allergic rhinitis, cause unspecified   . Allergy   . Bronchitis   . Esophageal reflux   . GERD (gastroesophageal reflux disease)   . Hyperlipidemia   . Hypertension   . Metabolic syndrome   . Obesity, unspecified   . Osteoarthritis   . Osteoarthrosis, unspecified whether generalized or localized, unspecified site   . Right upper quadrant pain    Past Surgical History:  Procedure Laterality Date  . COLONOSCOPY    . LAPAROSCOPIC APPENDECTOMY N/A 11/28/2018   Procedure: APPENDECTOMY LAPAROSCOPIC;  Surgeon: Jules Husbands, MD;  Location: [facility] ORS;  Service: General;  Laterality: N/A;  . ORTHOPEDIC SURGERY    . SKIN CANCER DESTRUCTION Left 03/06/2015   Hand- Dr. Phillip Heal  . SKIN CANCER EXCISION Left 08/2018   Dr. Phillip Heal on Left Arm     Allergies  Allergies  Allergen Reactions  . Lisinopril     Rash, pins and needles in arms    History of Present Illness    Richard Mueller is a 64 y.o. male with PMH as above.  He reports 17 years as a smoker. He has a history of hypertension, hyperlipidemia, and no previous cardiac history.  He does have a history of family CAD.  He reports that his mother had a heart attack at age 37 yo, deceased of a heart attack at age 8.  His father has 6 stents.  His brother just recently underwent open heart surgery with triple bypass.  He was evaluated in 2014 for atypical chest pain.  He underwent  treadmill nuclear stress test without evidence of ischemia and normal EF.    When seen in clinic 12/2018, he was taking his medication regularly.  He denied any symptoms.  He had appendicitis the previous month and underwent successful appendectomy without complications.  CT of the abdomen showed aortic atherosclerosis but without obstructive disease or aneurysm.  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 CP was described as different from that of his previously reported CP. Associated sx include fluttering and SOB/DOE. He reported significant anxiety surrounding his familial history of CAD and recent symptoms.  His brother, 2 years younger, had recently underwent triple bypass surgery.  He had recently quit drinking, previously drinking beer 3 nights a week.  He had quit smoking in January 2004.  He denied a regular workout routine. He was able to stay active around the home, including weeding up to 6 hours on his [place] acres.  He was planning to go on a road trip to [place], [place], and [place] 04/04/2020.  EKG was without acute ST/T changes.  Echo and Lexiscan Myoview was ordered, as well as 2-week ZIO monitor.  He was continued on  his current medications.  He underwent 2 weeks ZIO monitoring that showed predominantly NSR with average heart rate of 71 bpm.  He had one wide-complex tachycardia, lasting only 4 beats, and felt likely SVT with aberrancy although slow nonsustained VT could not be excluded.  He had 6 episodes of SVT, the longest of which lasted 16 beats.  He also had rare PACs and PVCs.  His triggered events did not correlate with arrhythmia.  04/21/2020 echo showed EF 55-60.  NR WMA.  Mild MR, mild aortic valve sclerosis without stenosis, RAP 3 mmHg, RVSP 31.9  mmHg.  03/28/2020 MPI showed a small in size, moderate in severity, fixed mid and apical inferior defect that most likely represented attenuation artifact but cannot rule out scar.  No obvious ischemia was identified.  LV SF was normal by sevens calculation (63%).  Q GS calculation 39%, thought likely artifactually low due to gating parameters.  TWI was noted in 2, 3, aVF, V5, and V6.  Coronary artery and aortic valvular calcifications were noted on the attenuation correction images.  It was noted that the sensitivity and specificity of the study were significantly limited by extracardiac uptake and to consider alternative testing.  On 05/01/2020, he underwent cardiac CTA that showed a CAC score of 857, placing him in the 91st percentile for age and sex max control.  Calcified and noncalcified plaque was present in the proximal LAD, causing moderate stenosis.  Calcified plaque was present in the proximal left circumflex and distal left main, causing mild stenosis.FFR nl.   It was recommended he consider symptom guided anti-ischemic pharmacotherapy, as well as risk factor modification per guideline directed team.  Today, 05/07/2020, he presents to clinic with his wife to review the cardiovascular testing as above.  Since his last visit, he has continued to experience episodes of atypical chest pain.  He reports that this chest pain is right-sided and pinpoint in location around the area of his 5th ICS.  He reports the CP lasts only minutes before dissipating and is without clear triggers or alleviating factors.  He also periodically continues to feel fluttering at times, associated with a CP but denies any other associated symptoms.  He denies any racing heart rate, presyncope, or syncope.  No recent falls.  He has noted that he has recently had poor tolerance of heat and humidity when out walking with his wife and associated DOE unchanged from previous visit.  He continues to note job stress with plan to retire in  2023.  He denies any signs or symptoms of heart failure.  Cardiac testing as below reviewed in detail.  Home Medications    Prior to Admission medications   Medication Sig Start Date End Date Taking? Authorizing Provider  Aloe Vera GEL Apply topically.   Yes [provider]  Apoaequorin (PREVAGEN PO) Take by mouth.   Yes [provider]  Ascorbic Acid (VITAMIN C) 1000 MG tablet Take 500 mg by mouth daily.    Yes [provider]  aspirin 81 MG tablet Take 81 mg by mouth daily.   Yes [provider]  atorvastatin (LIPITOR) 20 MG tablet TAKE 1 TABLET BY MOUTH DAILY. 01/29/20  Yes Wellington Hampshire, MD  b complex vitamins tablet Take 1 tablet by mouth daily.   Yes [provider]  Calcium 200 MG TABS Take by mouth.   Yes [provider]  cholecalciferol (VITAMIN D3) 25 MCG (1000 UNIT) tablet Take 1,000 Units by mouth daily.   Yes  [provider]  fexofenadine (ALLEGRA) 180 MG tablet Take 180 mg by mouth as needed.    Yes [provider]  Ginkgo Biloba 100 MG CAPS Take by mouth.   Yes [provider]  losartan (COZAAR) 50 MG tablet TAKE 1 TABLET BY MOUTH DAILY. 01/29/20  Yes Wellington Hampshire, MD  Multiple Vitamin (MULTIVITAMIN) tablet Take 1 tablet by mouth daily.   Yes [provider]  naproxen (NAPROSYN) 500 MG tablet Take 500 mg by mouth 2 (two) times daily with a meal.   Yes [provider]  Omega-3 Fatty Acids (FISH OIL) 1000 MG CAPS Take by mouth.   Yes [provider]    Review of Systems    He denies pnd, orthopnea, n, v, dizziness, syncope, edema, weight gain, or early satiety. He reports right sided and pinpointed CP with occasional palpitations, intolerance to high heat/humidity, and unchanged SOB/DOE with heavy exertion.   All other systems reviewed and are otherwise negative except as noted above.  Physical Exam    VS:  BP 120/78 (BP Location: Left Arm, Patient Position:  Sitting, Cuff Size: Large)   Pulse 88   Ht 5\' 8"  (1.727 m)   Wt 213 lb (96.6 kg)   SpO2 98%   BMI 32.39 kg/m  , BMI Body mass index is 32.39 kg/m. GEN: Well nourished, well developed, in no acute distress. Mask in place. HEENT: normal. Neck: Supple, no JVD, carotid bruits, or masses. Cardiac: RRR, no murmurs, rubs, or gallops. No clubbing, cyanosis, mild nonpitting LLE edema, trace RLEE.  Radials/DP/PT 2+ and equal bilaterally.  Respiratory:  Respirations regular and unlabored, clear to auscultation bilaterally. GI: Soft, nontender, nondistended, BS + x 4. MS: no deformity or atrophy. Skin: warm and dry, no rash. Neuro:  Strength and sensation are intact. Psych: Normal affect.  Accessory Clinical Findings    ECG personally reviewed by me today - No new tracings .  VITALS Reviewed today   Temp Readings from Last 3 Encounters:  12/18/19 (!) 97.5 F (36.4 C) (Temporal)  11/27/19 98.3 F (36.8 C)  06/13/19 (!) 97.3 F (36.3 C) (Temporal)   BP Readings from Last 3 Encounters:  05/07/20 120/78  05/01/20 106/65  03/21/20 126/72   Pulse Readings from Last 3 Encounters:  05/07/20 88  05/01/20 (!) 50  03/21/20 94    Wt Readings from Last 3 Encounters:  05/07/20 213 lb (96.6 kg)  03/21/20 (!) 212 lb (96.2 kg)  12/18/19 209 lb 12.8 oz (95.2 kg)     LABS  reviewed today   Lab Results  Component Value Date   WBC 7.9 12/18/2019   HGB 15.1 12/18/2019   HCT 44.1 12/18/2019   MCV 91.3 12/18/2019   PLT 239 12/18/2019   Lab Results  Component Value Date   CREATININE 0.88 04/24/2020   BUN 15 04/24/2020   NA 136 04/24/2020   K 4.2 04/24/2020   CL 103 04/24/2020   CO2 26 04/24/2020   Lab Results  Component Value Date   ALT 18 12/18/2019   AST 16 12/18/2019   ALKPHOS 57 11/27/2018   BILITOT 0.6 12/18/2019   Lab Results  Component Value Date   CHOL 162 12/18/2019   HDL 54 12/18/2019   LDLCALC 73 12/18/2019   TRIG 267 (H) 12/18/2019   CHOLHDL 3.0 12/18/2019     Lab Results  Component Value Date   HGBA1C 5.5 12/18/2019   Lab Results  Component Value Date   TSH 1.120 05/26/2015  STUDIES/PROCEDURES reviewed today   Cardiac CTA 05/01/20 IMPRESSION: 1. High coronary calcium score of 857. This was 91st percentile for age and sex matched control ([place]). 2. Normal coronary origin with right dominance. 3. Calcified and non calcified plaque in the proximal LAD causing moderate stenosis. 4. Calcified plaque in the proximal LCx and distal LM causing mild stenosis 5. CAD-RADS 3. Moderate stenosis. Consider symptom-guided anti-ischemic pharmacotherapy as well as risk factor modification per guideline directed care. Additional analysis with CT FFR will be submitted and reported separately.  1. Left Main:  No significant stenosis. 2. LAD: No significant stenosis.  FFR 0.9 proximally, 0.84 distally 3. LCX: No significant stenosis.  FFR 0.91 4. RCA: No significant stenosis.  FFR 0.9 IMPRESSION: 1.  CT FFR analysis didn't show any significant stenosis.  Echo 04/21/2020 1. Left ventricular ejection fraction, by estimation, is 55 to 60%. The  left ventricle has normal function. The left ventricle has no regional  wall motion abnormalities. Left ventricular diastolic parameters were  normal. The average left ventricular  global longitudinal strain is -15.9 %.  2. Right ventricular systolic function is normal. The right ventricular  size is normal. There is normal pulmonary artery systolic pressure.  3. The mitral valve is normal in structure. Mild mitral valve  regurgitation. No evidence of mitral stenosis.  4. The aortic valve is tricuspid. Aortic valve regurgitation is not  visualized. Mild aortic valve sclerosis is present, with no evidence of  aortic valve stenosis.  5. The inferior vena cava is normal in size with greater than 50%  respiratory variability, suggesting right atrial pressure of 3 mmHg.   Cardiac monitoring/  Zio 03/28/20 Normal sinus rhythm with an average heart rate of 71 bpm. 1 run of wide-complex tachycardia lasting only 4 beats likely SVT with aberrancy although slow nonsustained ventricular tachycardia cannot be excluded. 6 episodes of SVT noted the longest lasted 16 beats. Rare PACs and rare PVCs. Most triggered events did not correlate with arrhythmia.  2014 Lexiscan Nl study Summary   1. Clinical correlation is recommended.   2. Exercise myocardial perfusion study with no significant ischemia.   3. No significant wall motion abnormality noted.   4. Overall, this is a Low risk scan.   5. The estimated ejection fraction is 61%.   6. The left ventricular global function was normal.   7. There are no EKG changes concerning for ischemia.   8. There is a fixed inferior wall defect with normal wall motion likely  attenutation.   9. There is attenuation artifact noted on this study.   Assessment & Plan    Atypical CP Elevated coronary artery calcium score Family history of CAD  --Ongoing sx as above in HPI.  CP occurs both with exertion and at rest without clear triggers or exacerbating factors.  Given his family history, further cardiac testing was performed as above.  Echo showed normal pump function 55 to 60% and no regional wall motion abnormalities.  Lexiscan performed with recommendation for further testing given poor specificity and sensitivity of results.  Cardiac CTA showed high coronary calcium score of 857, placing him at the 91st percentile. Subsequent  FFR completed and normal with recommendation to consider symptom guided anti-ischemic pharmacotherapy and risk factor modification. Escalate GDMT as tolerated. Continue ASA 81mg  daily. Given his episodes of SVT and brief episode of SVT with aberrancy versus VT with room in HR, will start Coreg 3.125mg  BID for HR/BP control and antianginal relief. He will let us  know how he tolerates this medication. Increase to Lipitor to 40 mg daily  for more optimal LDL control. Repeat LDL / ALT in 6 to 8 weeks.  Lifestyle changes reviewed, including diet and exercise.  He has quit smoking and drinking alcohol.  he will call the office if he notices worsening or new symptoms.  At RTC, escalate GDMT as tolerated.   SVT/SVT with aberrancy/suspected SVT with aberrancy PACs and PVCs --Symptomatic with monitoring performed as above.  Cardiac monitoring performed and showed predominantly NSR with SVT, as well as one brief run of SVT with aberrancy versus wide complex tachycardia.  Also noted was PACs and PVCs.  Discussed that these runs of SVT and ectopy could be contributing to his atypical chest pain as above.  Given his average heart rate of 71 bpm with previously recorded HR 50 bpm on 9/3, start low-dose carvedilol 3.125 mg and escalate as tolerated.  If he does not tolerate carvedilol, consider metoprolol if HR allows, or given his normal EF as above, long-acting diltiazem.  HLD --LDL goal < 70.  Lipids have been reasonably well controlled.  Recent 12/18/2019 lipid profile showed total cholesterol 162 with LDL 73 and triglycerides 267.  Increase to Lipitor 40 mg daily with repeat lipid and liver function in 6 to 8 weeks.  HTN --Goal BP 130/80 or lower.  BP reasonably well controlled.  SBP today well controlled with DBP borderline at 78.  Given study showed carvedilol has effective influence on DBP, will start carvedilol 3.125 mg twice daily and to be escalated tolerated.  Continue current losartan 50 mg daily.  Aortic atherosclerosis --Incidental finding on CT. Recommend ASA and statin, as well as HR and BP control.  Lifestyle changes discussed.  BMI 30.0 -35.0 --Lifestyle changes, including diet and exercise.  Previous tobacco and alcohol use --Quit smoking January 2004.  He no longer drinks alcohol.  Ongoing cessation recommended.  RTC 6 months or sooner if needed.   Arvil Chaco, PA-C

## 2020-05-07 NOTE — Patient Instructions (Signed)
Medication Instructions:  - Your physician has recommended you make the following change in your medication:   1) INCREASE lipitor (atorvastatin) 40 mg- take 1 tablet by mouth once daily   2) START coreg (carvedilol) 3.125 mg- take 1 tablet by mouth TWICE daily   *If you need a refill on your cardiac medications before your next appointment, please call your pharmacy*   Lab Work: - Your physician recommends that you return for FASTING lab work in: 6 weeks (around 06/18/20)- [facility] entrance at [facility], 1st desk on the right to check in past the screening table - Lab hours: Monday- Friday (7:30 am- 5:30 pm) - no food or drink for 8 hours prior to your lab draw except for water or black coffee  If you have labs (blood work) drawn today and your tests are completely normal, you will receive your results only by: Marland Kitchen MyChart Message (if you have MyChart) OR . A paper copy in the mail If you have any lab test that is abnormal or we need to change your treatment, we will call you to review the results.   Testing/Procedures: - none ordered   Follow-Up: At [facility], you and your health needs are our priority.  As part of our continuing mission to provide you with exceptional heart care, we have created designated Provider Care Teams.  These Care Teams include your primary Cardiologist (physician) and Advanced Practice Providers (APPs -  Physician Assistants and Nurse Practitioners) who all work together to provide you with the care you need, when you need it.  We recommend signing up for the patient portal called "MyChart".  Sign up information is provided on this After Visit Summary.  MyChart is used to connect with patients for Virtual Visits (Telemedicine).  Patients are able to view lab/test results, encounter notes, upcoming appointments, etc.  Non-urgent messages can be sent to your provider as well.   To learn more about what you can do with MyChart, go to  [email or web address].    Your next appointment:   6 month(s)  The format for your next appointment:   In Person  Provider:    You may see Kathlyn Sacramento, MD or one of the following Advanced Practice Providers on your designated Care Team:    Murray Hodgkins, NP  Christell Faith, PA-C  Marrianne Mood, PA-C    Other Instructions - Please call the office if you feel like you are not tolerating the medication changes made today - Please check your blood pressure at home, the goal is ,130/80 - Please call the office if your blood pressure numbers are above the goal

## 2020-05-12 ENCOUNTER — Other Ambulatory Visit: Payer: Self-pay

## 2020-05-12 DIAGNOSIS — E278 Other specified disorders of adrenal gland: Secondary | ICD-10-CM

## 2020-05-13 ENCOUNTER — Other Ambulatory Visit: Payer: Self-pay

## 2020-05-13 DIAGNOSIS — E278 Other specified disorders of adrenal gland: Secondary | ICD-10-CM

## 2020-05-27 ENCOUNTER — Ambulatory Visit: Payer: 59 | Attending: Internal Medicine

## 2020-05-27 ENCOUNTER — Other Ambulatory Visit: Payer: Self-pay | Admitting: Internal Medicine

## 2020-05-27 DIAGNOSIS — Z23 Encounter for immunization: Secondary | ICD-10-CM

## 2020-05-27 NOTE — Progress Notes (Signed)
   Covid-19 Vaccination Clinic  Name:  Kymari Nuon III    MRN: [id number] DOB: July 09, 1956  05/27/2020  Mr. Pyle III was observed post Covid-19 immunization for 15 minutes without incident. He was provided with Vaccine Information Sheet and instruction to access the V-Safe system.   Mr. Gershman III was instructed to call 911 with any severe reactions post vaccine: Marland Kitchen Difficulty breathing  . Swelling of face and throat  . A fast heartbeat  . A bad rash all over body  . Dizziness and weakness

## 2020-06-12 ENCOUNTER — Other Ambulatory Visit: Payer: Self-pay

## 2020-06-12 ENCOUNTER — Other Ambulatory Visit
Admission: RE | Admit: 2020-06-12 | Discharge: 2020-06-12 | Disposition: A | Discharge status: Home / Self Care | Payer: 59 | Attending: Surgery | Admitting: Surgery

## 2020-06-12 DIAGNOSIS — E278 Other specified disorders of adrenal gland: Secondary | ICD-10-CM | POA: Insufficient documentation

## 2020-06-12 LAB — CREATININE, SERUM
Creatinine, Ser: 1.04 mg/dL (ref 0.61–1.24)
GFR, Estimated: >60 mL/min (ref >60)

## 2020-06-16 ENCOUNTER — Other Ambulatory Visit: Payer: Self-pay | Admitting: Neurological Surgery

## 2020-06-16 ENCOUNTER — Other Ambulatory Visit (HOSPITAL_COMMUNITY): Payer: Self-pay | Admitting: Neurological Surgery

## 2020-06-16 ENCOUNTER — Ambulatory Visit
Admission: RE | Admit: 2020-06-16 | Discharge: 2020-06-16 | Disposition: A | Discharge status: Home / Self Care | Payer: 59 | Source: Ambulatory Visit | Attending: Surgery | Admitting: Surgery

## 2020-06-16 ENCOUNTER — Other Ambulatory Visit: Payer: Self-pay | Admitting: Surgery

## 2020-06-16 ENCOUNTER — Other Ambulatory Visit: Payer: Self-pay

## 2020-06-16 DIAGNOSIS — E278 Other specified disorders of adrenal gland: Secondary | ICD-10-CM

## 2020-06-16 DIAGNOSIS — Q762 Congenital spondylolisthesis: Secondary | ICD-10-CM

## 2020-06-18 ENCOUNTER — Encounter: Payer: Self-pay | Admitting: Family Medicine

## 2020-06-18 ENCOUNTER — Ambulatory Visit (INDEPENDENT_AMBULATORY_CARE_PROVIDER_SITE_OTHER): Payer: 59 | Admitting: Family Medicine

## 2020-06-18 ENCOUNTER — Other Ambulatory Visit: Payer: Self-pay

## 2020-06-18 VITALS — BP 126/70 | HR 67 | Temp 97.9°F | Resp 16 | Ht 68.0 in | Wt 211.4 lb

## 2020-06-18 DIAGNOSIS — R0789 Other chest pain: Secondary | ICD-10-CM

## 2020-06-18 DIAGNOSIS — R931 Abnormal findings on diagnostic imaging of heart and coronary circulation: Secondary | ICD-10-CM | POA: Insufficient documentation

## 2020-06-18 DIAGNOSIS — E8881 Metabolic syndrome: Secondary | ICD-10-CM

## 2020-06-18 DIAGNOSIS — Z1211 Encounter for screening for malignant neoplasm of colon: Secondary | ICD-10-CM

## 2020-06-18 DIAGNOSIS — I10 Essential (primary) hypertension: Secondary | ICD-10-CM | POA: Diagnosis not present

## 2020-06-18 DIAGNOSIS — I471 Supraventricular tachycardia: Secondary | ICD-10-CM | POA: Diagnosis not present

## 2020-06-18 DIAGNOSIS — I700 Atherosclerosis of aorta: Secondary | ICD-10-CM | POA: Diagnosis not present

## 2020-06-18 DIAGNOSIS — E7800 Pure hypercholesterolemia, unspecified: Secondary | ICD-10-CM | POA: Diagnosis not present

## 2020-06-18 DIAGNOSIS — Z23 Encounter for immunization: Secondary | ICD-10-CM | POA: Diagnosis not present

## 2020-06-18 DIAGNOSIS — E278 Other specified disorders of adrenal gland: Secondary | ICD-10-CM

## 2020-06-18 DIAGNOSIS — Z8249 Family history of ischemic heart disease and other diseases of the circulatory system: Secondary | ICD-10-CM

## 2020-06-18 NOTE — Addendum Note (Signed)
Addended by: Royal Hawthorn on: 06/18/2020 08:27 AM   Modules accepted: Orders

## 2020-06-18 NOTE — Progress Notes (Signed)
Name: Richard Mueller   MRN: [id number]    DOB: November 11, 1955   Date:06/18/2020       Progress Note  Subjective  Chief Complaint  Chief Complaint  Patient presents with  . Follow-up    6 month follow up    HPI     Adrenal Incidentaloma: no symptoms , he was found to have aorta atherosclerosis during CT abdomen       and is taking atorvastatin and aspirin      CT from 06/16/2020   IMPRESSION:  1. Low-density LEFT adrenal lesion at 22 Hounsfield units,  previously characterized by washout CT as adrenal adenoma without  change in size.  2. Aortic atherosclerosis.   Atypical chest pain: under the care of Dr. Fletcher Anon, had multiple tests done since his last visit with me, because of family history of heart disease and episode of SVT during monitor, plus atypical chest pain with elevated coronary calcium score, he was given low dose of coreg , advised to take aspirin 81 mg and increase dose of Atorvastatin to 40 mg , goal LDL is below 70 , we will recheck labs today.   Cardiac CTA 05/01/20 IMPRESSION: 1. High coronary calcium score of 857. This was 91st percentile for age and sex matched control ([place]). 2. Normal coronary origin with right dominance. 3. Calcified and non calcified plaque in the proximal LAD causing moderate stenosis. 4. Calcified plaque in the proximal LCx and distal LM causing mild stenosis 5. CAD-RADS 3. Moderate stenosis. Consider symptom-guided anti-ischemic pharmacotherapy as well as risk factor modification per guideline directed care. Additional analysis with CT FFR will be submitted and reported separately.  1. Left Main: No significant stenosis. 2. LAD: No significant stenosis. FFR 0.9 proximally, 0.84 distally 3. LCX: No significant stenosis. FFR 0.91 4. RCA: No significant stenosis. FFR 0.9 IMPRESSION: 1. CT FFR analysis didn't show any significant stenosis.  Echo 04/21/2020 1. Left ventricular ejection fraction, by estimation, is 55  to 60%. The  left ventricle has normal function. The left ventricle has no regional  wall motion abnormalities. Left ventricular diastolic parameters were  normal. The average left ventricular  global longitudinal strain is -15.9 %.  2. Right ventricular systolic function is normal. The right ventricular  size is normal. There is normal pulmonary artery systolic pressure.  3. The mitral valve is normal in structure. Mild mitral valve  regurgitation. No evidence of mitral stenosis.  4. The aortic valve is tricuspid. Aortic valve regurgitation is not  visualized. Mild aortic valve sclerosis is present, with no evidence of  aortic valve stenosis.  5. The inferior vena cava is normal in size with greater than 50%  respiratory variability, suggesting right atrial pressure of 3 mmHg.   Cardiac monitoring/ [facility] 03/28/20 Normal sinus rhythm with an average heart rate of 71 bpm. 1 run of wide-complex tachycardia lasting only 4 beats likely SVT with aberrancy although slow nonsustained ventricular tachycardia cannot be excluded. 6 episodes of SVT noted the longest lasted 16 beats. Rare PACs and rare PVCs. Most triggered events did not correlate with arrhythmia.  2014 Lexiscan Nl study Summary  1. Clinical correlation is recommended.  2. Exercise myocardial perfusion study with no significant ischemia.  3. No significant wall motion abnormality noted.  4. Overall, this is a Low risk scan.  5. The estimated ejection fraction is 61%.  6. The left ventricular global function was normal.  7. There are no EKG changes concerning for ischemia.  8. There  is a fixed inferior wall defect with normal wall motion likely  attenutation.  9. There is attenuation artifact noted on this study.    Hyperlipidemia: taking Atorvastatin, higher dose since last visit with cardiologist, we will recheck levels today, goal is to have LDL below 70  He denies myalgia   HTN: currently on losartan and  also on carvedilol, bp is at goal, he denies recent episodes of  atypical chest pain  under the care of cardiologist   Metabolic Syndrome: He went to diabetic teaching class in 2018. He usually eats at home breakfast , lunch and dinner most of the time,  they grilled at home for dinner, he stopped drinking sweet tea and does not eat after 7 pm .He denies polyphagia, polydipsia and polyuria. He is feeling well. A1C has been as high at 5.9 % but over the past year down to 5.5 %   Senile purpura: on both arms, stable and reassurance given again   DDD spine: he was Dr. Rita Ohara but he retired, he is now under the care of Dr.  Zada Finders. There was a loss of height, he is concerned about osteoporosis, he used to be 5'11" and is down to 5'8". Pain is getting worse and he is going back for a MRI soon to evaluate for progression of the disease Taking Naproxen prn only    Patient Active Problem List   Diagnosis Date Noted  . Elevated coronary artery calcium score 06/18/2020  . SVT (supraventricular tachycardia) ([place]) 06/18/2020  . Adrenal incidentaloma ([place]) 12/18/2019  . Basal cell carcinoma, forehead 08/07/2019  . Atherosclerosis of aorta ([place]) 12/06/2018  . Degenerative disc disease, lumbar 12/06/2018  . Adenoma of left adrenal gland 12/06/2018  . PVC (premature ventricular contraction) 12/20/2016  . History of squamous cell carcinoma of skin 05/26/2015  . Family history of cardiac disorder 03/17/2015  . Gastro-esophageal reflux disease without esophagitis 03/17/2015  . Obesity (BMI 30-39.9) 03/17/2015  . Osteoarthrosis 03/17/2015  . Perennial allergic rhinitis with seasonal variation 03/17/2015  . Hyperlipidemia   . Hypertension   . Dysmetabolic syndrome 46/50/3546    Past Surgical History:  Procedure Laterality Date  . COLONOSCOPY    . LAPAROSCOPIC APPENDECTOMY N/A 11/28/2018   Procedure: APPENDECTOMY LAPAROSCOPIC;  Surgeon: Jules Husbands, MD;  Location: [facility] ORS;  Service: General;   Laterality: N/A;  . ORTHOPEDIC SURGERY    . SKIN CANCER DESTRUCTION Left 03/06/2015   Hand- Dr. Phillip Heal  . SKIN CANCER EXCISION Left 08/2018   Dr. Phillip Heal on Left Arm     Family History  Problem Relation Age of Onset  . Heart disease Mother   . Heart attack Mother   . COPD Mother   . Heart disease Father   . Hypertension Father   . COPD Father   . Cancer Father         Prostate and Skin  . COPD Sister   . Hypertension Daughter   . Heart attack Brother 54       2014  . Depression Daughter     Social History   Tobacco Use  . Smoking status: Former Smoker    Packs/day: 1.00    Years: 15.00    Pack years: 15.00    Types: Cigarettes    Start date: 08/24/1987    Quit date: 08/26/2002    Years since quitting: 17.8  . Smokeless tobacco: Never Used  Substance Use Topics  . Alcohol use: Yes    Alcohol/week: 0.0 standard drinks  Comment: ocassional     Current Outpatient Medications:  .  Ascorbic Acid (VITAMIN C) 1000 MG tablet, Take 500 mg by mouth daily. , Disp: , Rfl:  .  aspirin 81 MG tablet, Take 81 mg by mouth daily., Disp: , Rfl:  .  atorvastatin (LIPITOR) 40 MG tablet, Take 1 tablet (40 mg total) by mouth daily., Disp: 90 tablet, Rfl: 3 .  b complex vitamins tablet, Take 1 tablet by mouth daily., Disp: , Rfl:  .  Calcium 200 MG TABS, Take by mouth., Disp: , Rfl:  .  carvedilol (COREG) 3.125 MG tablet, Take 1 tablet (3.125 mg total) by mouth 2 (two) times daily., Disp: 180 tablet, Rfl: 3 .  cholecalciferol (VITAMIN D3) 25 MCG (1000 UNIT) tablet, Take 1,000 Units by mouth daily., Disp: , Rfl:  .  fexofenadine (ALLEGRA) 180 MG tablet, Take 180 mg by mouth as needed. , Disp: , Rfl:  .  Ginkgo Biloba 100 MG CAPS, Take by mouth., Disp: , Rfl:  .  losartan (COZAAR) 50 MG tablet, TAKE 1 TABLET BY MOUTH DAILY., Disp: 30 tablet, Rfl: 4 .  Magnesium 400 MG CAPS, Take 1 capsule by mouth every other day., Disp: , Rfl:  .  Multiple Vitamin (MULTIVITAMIN) tablet, Take 1 tablet by  mouth daily., Disp: , Rfl:  .  naproxen (NAPROSYN) 500 MG tablet, Take 500 mg by mouth 2 (two) times daily as needed., Disp: , Rfl:  .  Omega-3 Fatty Acids (FISH OIL) 1000 MG CAPS, Take by mouth., Disp: , Rfl:  .  naproxen (NAPROSYN) 500 MG tablet, Take 500 mg by mouth 2 (two) times daily with a meal. (Patient not taking: Reported on 06/18/2020), Disp: , Rfl:   Allergies  Allergen Reactions  . Lisinopril     Rash, pins and needles in arms    I personally reviewed active problem list, medication list, allergies, family history, social history, health maintenance with the patient/caregiver today.   ROS  Constitutional: Negative for fever or weight change.  Respiratory: Negative for cough and shortness of breath.   Cardiovascular: Negative for chest pain or palpitations - states symptoms resolved with new medication .  Gastrointestinal: Negative for abdominal pain, no bowel changes.  Musculoskeletal: Negative for gait problem or joint swelling.  Skin: Negative for rash.  Neurological: Negative for dizziness or headache.  No other specific complaints in a complete review of systems (except as listed in HPI above).   Objective  Vitals:   06/18/20 0741  BP: 126/70  Pulse: 67  Resp: 16  Temp: 97.9 F (36.6 C)  SpO2: 99%  Weight: 211 lb 6.4 oz (95.9 kg)  Height: $Remove'5\' 8"'ydYTznR$  (1.727 m)    Body mass index is 32.14 kg/m.  Physical Exam  Constitutional: Patient appears well-developed and well-nourished. Obese  No distress.  HEENT: head atraumatic, normocephalic, pupils equal and reactive to light,  neck supple Cardiovascular: Normal rate, regular rhythm and normal heart sounds.  No murmur heard. No BLE edema. Pulmonary/Chest: Effort normal and breath sounds normal. No respiratory distress. Abdominal: Soft.  There is no tenderness. Psychiatric: Patient has a normal mood and affect. behavior is normal. Judgment and thought content normal.  Recent Results (from the past 2160 hour(s))   Basic metabolic panel     Status: Abnormal   Collection Time: 03/21/20  4:01 PM  Result Value Ref Range   Glucose 113 (H) 65 - 99 mg/dL   BUN 15 8 - 27 mg/dL   Creatinine, Ser 1.08 0.76 -  1.27 mg/dL   GFR calc non Af Amer 72 >59 mL/min/1.73   GFR calc Af Amer 83 >59 mL/min/1.73    Comment: **[facility] currently reports eGFR in compliance with the current**   recommendations of the [employer]. [facility] will   update reporting as new guidelines are published from the NKF-ASN   Task force.    BUN/Creatinine Ratio 14 10 - 24   Sodium 139 134 - 144 mmol/L   Potassium 3.9 3.5 - 5.2 mmol/L   Chloride 100 96 - 106 mmol/L   CO2 22 20 - 29 mmol/L   Calcium 9.3 8.6 - 10.2 mg/dL  Magnesium     Status: None   Collection Time: 03/21/20  4:01 PM  Result Value Ref Range   Magnesium 2.2 1.6 - 2.3 mg/dL  NM Myocar Multi W/Spect W/Wall Motion / EF     Status: None   Collection Time: 03/28/20 12:02 PM  Result Value Ref Range   Rest HR 56 bpm   Rest BP 118/82 mmHg   Percent HR 61 %   Peak HR 96 bpm   Peak BP 118/65 mmHg   TID 1.20    LV sys vol 69 mL   LV dias vol 184 62 - 150 mL  ECHOCARDIOGRAM COMPLETE     Status: None   Collection Time: 04/21/20  9:12 AM  Result Value Ref Range   AR max vel 3.30 cm2   AV Peak grad 6.0 mmHg   Ao pk vel 1.22 m/s   S' Lateral 3.10 cm   Area-P 1/2 3.37 cm2   Single Plane A4C EF 58.3 %   Single Plane A2C EF 56.0 %   Calc EF 52.8 %  Basic metabolic panel     Status: None   Collection Time: 04/24/20 12:47 PM  Result Value Ref Range   Sodium 136 135 - 145 mmol/L   Potassium 4.2 3.5 - 5.1 mmol/L   Chloride 103 98 - 111 mmol/L   CO2 26 22 - 32 mmol/L   Glucose, Bld 97 70 - 99 mg/dL    Comment: Glucose reference range applies only to samples taken after fasting for at least 8 hours.   BUN 15 8 - 23 mg/dL   Creatinine, Ser 0.88 0.61 - 1.24 mg/dL   Calcium 9.2 8.9 - 10.3 mg/dL   GFR calc non Af Amer >60 >60 mL/min   GFR calc Af Amer >60 >60  mL/min   Anion gap 7 5 - 15    Comment: Performed at [facility], [address] [place], [place] [id number]  Creatinine, serum     Status: None   Collection Time: 06/12/20 12:46 PM  Result Value Ref Range   Creatinine, Ser 1.04 0.61 - 1.24 mg/dL   GFR, Estimated >60 >60 mL/min    Comment: (NOTE) Calculated using the CKD-EPI Creatinine Equation (2021) Performed at [facility], [address] [place],  [address]      PHQ2/9: Depression screen [facility] 2/9 06/18/2020 12/18/2019 11/27/2019 06/08/2019 12/06/2018  Decreased Interest 0 0 0 0 0  Down, Depressed, Hopeless 0 0 0 0 0  PHQ - 2 Score 0 0 0 0 0  Altered sleeping - 0 0 0 0  Tired, decreased energy - 0 0 0 0  Change in appetite - 0 0 0 0  Feeling bad or failure about yourself  - 0 0 0 0  Trouble concentrating - 0 0 0 0  Moving slowly or  fidgety/restless - 0 0 0 0  Suicidal thoughts - 0 0 0 0  PHQ-9 Score - 0 0 0 0  Difficult doing work/chores - - Not difficult at all Not difficult at all -    phq 9 is negative   Fall Risk: Fall Risk  06/18/2020 12/18/2019 11/27/2019 06/13/2019 12/06/2018  Falls in the past year? 0 0 0 0 0  Number falls in past yr: 0 0 0 0 0  Injury with Fall? 0 0 0 0 0     Functional Status Survey: Is the patient deaf or have difficulty hearing?: No Does the patient have difficulty seeing, even when wearing glasses/contacts?: No Does the patient have difficulty concentrating, remembering, or making decisions?: No Does the patient have difficulty walking or climbing stairs?: No Does the patient have difficulty dressing or bathing?: No Does the patient have difficulty doing errands alone such as visiting a doctor's office or shopping?: No    Assessment & Plan  1. SVT (supraventricular tachycardia) (HCC)  Rate controlled with carvedilol   2. Atherosclerosis of aorta (HCC)  On statin and aspirin   3. Adrenal incidentaloma ([place])   4. Elevated coronary artery calcium  score  On medical management   5. Family history of heart disease   6. Pure hypercholesterolemia  - Lipid panel - COMPLETE METABOLIC PANEL WITH GFR  7. Essential hypertension  - COMPLETE METABOLIC PANEL WITH GFR  8. Dysmetabolic syndrome  Doing well with life style modification  9. Atypical chest pain   10. Need for shingles vaccine  Today  11. Colon cancer screening  - Ambulatory referral to Gastroenterology

## 2020-06-19 LAB — COMPLETE METABOLIC PANEL WITH GFR
AG Ratio: 2.1 (calc) (ref 1.0–2.5)
ALT: 18 U/L (ref 9–46)
AST: 20 U/L (ref 10–35)
Albumin: 4.5 g/dL (ref 3.6–5.1)
Alkaline phosphatase (APISO): 54 U/L (ref 35–144)
BUN: 14 mg/dL (ref 7–25)
CO2: 30 mmol/L (ref 20–32)
Calcium: 9.5 mg/dL (ref 8.6–10.3)
Chloride: 102 mmol/L (ref 98–110)
Creat: 1.07 mg/dL (ref 0.70–1.25)
GFR, Est African American: 85 mL/min/{1.73_m2} (ref >=60)
GFR, Est Non African American: 73 mL/min/{1.73_m2} (ref >=60)
Globulin: 2.1 g/dL (calc) (ref 1.9–3.7)
Glucose, Bld: 94 mg/dL (ref 65–99)
Potassium: 4.7 mmol/L (ref 3.5–5.3)
Sodium: 137 mmol/L (ref 135–146)
Total Bilirubin: 0.7 mg/dL (ref 0.2–1.2)
Total Protein: 6.6 g/dL (ref 6.1–8.1)

## 2020-06-19 LAB — LIPID PANEL
Cholesterol: 131 mg/dL (ref <200)
HDL: 49 mg/dL (ref >=40)
LDL Cholesterol (Calc): 60 mg/dL (calc)
Non-HDL Cholesterol (Calc): 82 mg/dL (calc) (ref <130)
Total CHOL/HDL Ratio: 2.7 (calc) (ref <5.0)
Triglycerides: 140 mg/dL (ref <150)

## 2020-06-23 ENCOUNTER — Other Ambulatory Visit: Payer: Self-pay

## 2020-06-23 ENCOUNTER — Ambulatory Visit: Payer: 59 | Admitting: Surgery

## 2020-06-23 ENCOUNTER — Encounter: Payer: Self-pay | Admitting: Surgery

## 2020-06-23 VITALS — BP 156/83 | HR 61 | Temp 97.7°F | Ht 68.5 in | Wt 212.0 lb

## 2020-06-23 DIAGNOSIS — E278 Other specified disorders of adrenal gland: Secondary | ICD-10-CM | POA: Diagnosis not present

## 2020-06-23 NOTE — Patient Instructions (Addendum)
Follow up in one year with a CT abdomen and pelvis with contrast prior. We will send you a letter about this appointment.    Please call and ask to speak with a nurse if you develop questions or concerns.

## 2020-06-23 NOTE — Progress Notes (Signed)
Outpatient Surgical Follow Up  06/23/2020  [facility] is an 64 y.o. male.   Chief Complaint  Patient presents with  . Follow-up    Adrenal Mass    HPI: Richard Mueller s/p appendectomy > 1 year ago found to have a left adrenal incidentaloma.  He has been asymptomatic.  CT scan showing a small 2 cm adenoma.  He underwent a recent CT to follow-up this adenoma.  I have personally reviewed the images and there is no significant change on the left adrenal incidentaloma.  Does have hypertension and SVT the hypertension is controlled with a single agent  Past Medical History:  Diagnosis Date  . Allergic rhinitis, cause unspecified   . Allergy   . Bronchitis   . Esophageal reflux   . GERD (gastroesophageal reflux disease)   . Hyperlipidemia   . Hypertension   . Metabolic syndrome   . Obesity, unspecified   . Osteoarthritis   . Osteoarthrosis, unspecified whether generalized or localized, unspecified site   . Right upper quadrant pain     Past Surgical History:  Procedure Laterality Date  . COLONOSCOPY    . LAPAROSCOPIC APPENDECTOMY N/A 11/28/2018   Procedure: APPENDECTOMY LAPAROSCOPIC;  Surgeon: Jules Husbands, MD;  Location: [facility] ORS;  Service: General;  Laterality: N/A;  . ORTHOPEDIC SURGERY    . SKIN CANCER DESTRUCTION Left 03/06/2015   Hand- Dr. Phillip Heal  . SKIN CANCER EXCISION Left 08/2018   Dr. Phillip Heal on Left Arm     Family History  Problem Relation Age of Onset  . Heart disease Mother   . Heart attack Mother   . COPD Mother   . Heart disease Father   . Hypertension Father   . COPD Father   . Cancer Father         Prostate and Skin  . COPD Sister   . Suicidality Brother   . Hypertension Daughter   . Heart attack Brother 54       2014  . Depression Daughter     Social History:  reports that he quit smoking about 17 years ago. His smoking use included cigarettes. He started smoking about 32 years ago. He has a 15.00 pack-year smoking history. He has never used  smokeless tobacco. He reports current alcohol use. He reports that he does not use drugs.  Allergies:  Allergies  Allergen Reactions  . Lisinopril     Rash, pins and needles in arms    Medications reviewed.    ROS Full ROS performed and is otherwise negative other than what is stated in HPI   BP (!) 156/83   Pulse 61   Temp 97.7 F (36.5 C)   Ht 5' 8.5" (1.74 m)   Wt 212 lb (96.2 kg)   SpO2 97%   BMI 31.77 kg/m   Physical Exam Vitals and nursing note reviewed. Exam conducted with a chaperone present.  Constitutional:      General: He is not in acute distress.    Appearance: Normal appearance. He is normal weight.  Cardiovascular:     Rate and Rhythm: Normal rate and regular rhythm.     Heart sounds: No murmur heard.   Pulmonary:     Effort: Pulmonary effort is normal. No respiratory distress.     Breath sounds: Normal breath sounds. No stridor. No rhonchi.  Abdominal:     General: Abdomen is flat. There is no distension.     Palpations: There is no mass.  Tenderness: There is no abdominal tenderness.  Musculoskeletal:     Cervical back: Normal range of motion and neck supple. No rigidity or tenderness.  Skin:    General: Skin is warm and dry.     Capillary Refill: Capillary refill takes less than 2 seconds.  Neurological:     General: No focal deficit present.     Mental Status: He is alert and oriented to person, place, and time.  Psychiatric:        Mood and Affect: Mood normal.        Behavior: Behavior normal.        Thought Content: Thought content normal.        Judgment: Judgment normal.      Assessment/Plan:  1. Adrenal incidentaloma ([place]) Stable at this time. We will plan to do another CT in 1 year. No need for surgical intervention  Greater than 50% of the 25 minutes  visit was spent in counseling/coordination of care   Caroleen Hamman, MD [place] Surgeon

## 2020-06-25 ENCOUNTER — Telehealth (INDEPENDENT_AMBULATORY_CARE_PROVIDER_SITE_OTHER): Payer: Self-pay | Admitting: Gastroenterology

## 2020-06-25 ENCOUNTER — Other Ambulatory Visit: Payer: Self-pay

## 2020-06-25 DIAGNOSIS — Z86010 Personal history of colonic polyps: Secondary | ICD-10-CM

## 2020-06-25 MED ORDER — NA SULFATE-K SULFATE-MG SULF 17.5-3.13-1.6 GM/177ML PO SOLN: 1.0000 | Freq: Once | ORAL | 0 refills | Status: DC

## 2020-06-25 NOTE — Progress Notes (Signed)
Gastroenterology Pre-Procedure Review  Request Date: 07/21/20 Requesting Physician: Dr. Allen Norris  PATIENT REVIEW QUESTIONS: The patient responded to the following health history questions as indicated:    1. Are you having any GI issues? no 2. Do you have a personal history of Polyps? yes (2016 colonoscopy performed by Dr. Allen Norris colon polyp was noted) 3. Do you have a family history of Colon Cancer or Polyps? maternal grandmother had stomach cancer 32. Diabetes Mellitus? no 5. Joint replacements in the past 12 months?no 6. Major health problems in the past 3 months?no 7. Any artificial heart valves, MVP, or defibrillator?no    MEDICATIONS & ALLERGIES:    Patient reports the following regarding taking any anticoagulation/antiplatelet therapy:   Plavix, Coumadin, Eliquis, Xarelto, Lovenox, Pradaxa, Brilinta, or Effient? no Aspirin? yes (81 mg daily)  Patient confirms/reports the following medications:  Current Outpatient Medications  Medication Sig Dispense Refill  . Ascorbic Acid (VITAMIN C) 1000 MG tablet Take 500 mg by mouth daily.     Marland Kitchen aspirin 81 MG tablet Take 81 mg by mouth daily.    Marland Kitchen atorvastatin (LIPITOR) 40 MG tablet Take 1 tablet (40 mg total) by mouth daily. 90 tablet 3  . b complex vitamins tablet Take 1 tablet by mouth daily.    . Calcium 200 MG TABS Take by mouth.    . carvedilol (COREG) 3.125 MG tablet Take 1 tablet (3.125 mg total) by mouth 2 (two) times daily. 180 tablet 3  . cholecalciferol (VITAMIN D3) 25 MCG (1000 UNIT) tablet Take 1,000 Units by mouth daily.    . fexofenadine (ALLEGRA) 180 MG tablet Take 180 mg by mouth as needed.     . Ginkgo Biloba 100 MG CAPS Take by mouth.    . losartan (COZAAR) 50 MG tablet TAKE 1 TABLET BY MOUTH DAILY. 30 tablet 4  . Magnesium 400 MG CAPS Take 1 capsule by mouth every other day.    . Multiple Vitamin (MULTIVITAMIN) tablet Take 1 tablet by mouth daily.    . naproxen (NAPROSYN) 500 MG tablet Take 500 mg by mouth 2 (two) times  daily with a meal.    . Omega-3 Fatty Acids (FISH OIL) 1000 MG CAPS Take by mouth.    . Na Sulfate-K Sulfate-Mg Sulf 17.5-3.13-1.6 GM/177ML SOLN Take 1 kit by mouth once for 1 dose. 354 mL 0   No current facility-administered medications for this visit.    Patient confirms/reports the following allergies:  Allergies  Allergen Reactions  . Lisinopril     Rash, pins and needles in arms    Orders Placed This Encounter  Procedures  . Procedural/ Surgical Case Request: COLONOSCOPY WITH PROPOFOL    Standing Status:   Standing    Number of Occurrences:   1    Order Specific Question:   Pre-op diagnosis    Answer:   personal history of colon polyps    Order Specific Question:   CPT Code    Answer:   05397    AUTHORIZATION INFORMATION Primary Insurance: 1D#: Group #:  Secondary Insurance: 1D#: Group #:  SCHEDULE INFORMATION: Date: Monday 07/21/20 Time: Location:[facility]

## 2020-07-02 ENCOUNTER — Telehealth: Payer: Self-pay | Admitting: Gastroenterology

## 2020-07-02 ENCOUNTER — Other Ambulatory Visit: Payer: Self-pay

## 2020-07-02 ENCOUNTER — Ambulatory Visit
Admission: RE | Admit: 2020-07-02 | Discharge: 2020-07-02 | Disposition: A | Discharge status: Home / Self Care | Payer: 59 | Source: Ambulatory Visit | Attending: Neurological Surgery | Admitting: Neurological Surgery

## 2020-07-02 DIAGNOSIS — Q762 Congenital spondylolisthesis: Secondary | ICD-10-CM | POA: Insufficient documentation

## 2020-07-02 NOTE — Telephone Encounter (Signed)
Patients wife calling to cancel procedure with Dr. Allen Norris for 11.29.21. Patient requesting Dec 17, or Dec 20 if possible. Please call pt wife back to resch. Pt had screening call on 11.3.21.

## 2020-07-02 NOTE — Progress Notes (Signed)
New instructions have been sent via my chart and mailed.

## 2020-07-08 ENCOUNTER — Other Ambulatory Visit: Payer: Self-pay | Admitting: Neurological Surgery

## 2020-07-08 DIAGNOSIS — M546 Pain in thoracic spine: Secondary | ICD-10-CM

## 2020-07-22 ENCOUNTER — Other Ambulatory Visit: Payer: Self-pay

## 2020-07-22 ENCOUNTER — Ambulatory Visit: Payer: 59 | Admitting: Family Medicine

## 2020-07-22 ENCOUNTER — Ambulatory Visit
Admission: RE | Admit: 2020-07-22 | Discharge: 2020-07-22 | Disposition: A | Discharge status: Home / Self Care | Payer: 59 | Source: Ambulatory Visit | Attending: Neurological Surgery | Admitting: Neurological Surgery

## 2020-07-22 DIAGNOSIS — M546 Pain in thoracic spine: Secondary | ICD-10-CM | POA: Diagnosis not present

## 2020-07-22 MED ORDER — GADOBUTROL 1 MMOL/ML IV SOLN
9.0000 mL | Freq: Once | INTRAVENOUS | Status: AC | PRN
  Administered 2020-07-22: 9 mL via INTRAVENOUS

## 2020-07-25 ENCOUNTER — Other Ambulatory Visit: Payer: Self-pay | Admitting: Neurological Surgery

## 2020-07-29 ENCOUNTER — Encounter: Payer: Self-pay | Admitting: Gastroenterology

## 2020-08-06 ENCOUNTER — Other Ambulatory Visit
Admission: RE | Admit: 2020-08-06 | Discharge: 2020-08-06 | Disposition: A | Discharge status: Home / Self Care | Payer: 59 | Source: Ambulatory Visit | Attending: Gastroenterology | Admitting: Gastroenterology

## 2020-08-06 DIAGNOSIS — Z20822 Contact with and (suspected) exposure to covid-19: Secondary | ICD-10-CM | POA: Diagnosis not present

## 2020-08-06 DIAGNOSIS — Z01812 Encounter for preprocedural laboratory examination: Secondary | ICD-10-CM | POA: Diagnosis not present

## 2020-08-06 LAB — SARS CORONAVIRUS 2 (TAT 6-24 HRS): SARS Coronavirus 2: NEGATIVE

## 2020-08-07 NOTE — Discharge Instructions (Signed)
General Anesthesia, Adult, Care After This sheet gives you information about how to care for yourself after your procedure. Your health care provider may also give you more specific instructions. If you have problems or questions, contact your health care provider. What can I expect after the procedure? After the procedure, the following side effects are common:  Pain or discomfort at the IV site.  Nausea.  Vomiting.  Sore throat.  Trouble concentrating.  Feeling cold or chills.  Weak or tired.  Sleepiness and fatigue.  Soreness and body aches. These side effects can affect parts of the body that were not involved in surgery. Follow these instructions at home:  For at least 24 hours after the procedure:  Have a responsible adult stay with you. It is important to have someone help care for you until you are awake and alert.  Rest as needed.  Do not: ? Participate in activities in which you could fall or become injured. ? Drive. ? Use heavy machinery. ? Drink alcohol. ? Take sleeping pills or medicines that cause drowsiness. ? Make important decisions or sign legal documents. ? Take care of children on your own. Eating and drinking  Follow any instructions from your health care provider about eating or drinking restrictions.  When you feel hungry, start by eating small amounts of foods that are soft and easy to digest (bland), such as toast. Gradually return to your regular diet.  Drink enough fluid to keep your urine pale yellow.  If you vomit, rehydrate by drinking water, juice, or clear broth. General instructions  If you have sleep apnea, surgery and certain medicines can increase your risk for breathing problems. Follow instructions from your health care provider about wearing your sleep device: ? Anytime you are sleeping, including during daytime naps. ? While taking prescription pain medicines, sleeping medicines, or medicines that make you drowsy.  Return to  your normal activities as told by your health care provider. Ask your health care provider what activities are safe for you.  Take over-the-counter and prescription medicines only as told by your health care provider.  If you smoke, do not smoke without supervision.  Keep all follow-up visits as told by your health care provider. This is important. Contact a health care provider if:  You have nausea or vomiting that does not get better with medicine.  You cannot eat or drink without vomiting.  You have pain that does not get better with medicine.  You are unable to pass urine.  You develop a skin rash.  You have a fever.  You have redness around your IV site that gets worse. Get help right away if:  You have difficulty breathing.  You have chest pain.  You have blood in your urine or stool, or you vomit blood. Summary  After the procedure, it is common to have a sore throat or nausea. It is also common to feel tired.  Have a responsible adult stay with you for the first 24 hours after general anesthesia. It is important to have someone help care for you until you are awake and alert.  When you feel hungry, start by eating small amounts of foods that are soft and easy to digest (bland), such as toast. Gradually return to your regular diet.  Drink enough fluid to keep your urine pale yellow.  Return to your normal activities as told by your health care provider. Ask your health care provider what activities are safe for you. This information is not   intended to replace advice given to you by your health care provider. Make sure you discuss any questions you have with your health care provider. Document Revised: 08/12/2017 Document Reviewed: 03/25/2017 Elsevier Patient Education  2020 Elsevier Inc.  

## 2020-08-08 ENCOUNTER — Ambulatory Visit: Payer: 59 | Admitting: Anesthesiology

## 2020-08-08 ENCOUNTER — Encounter: Payer: Self-pay | Admitting: Gastroenterology

## 2020-08-08 ENCOUNTER — Encounter
Admission: RE | Disposition: A | Discharge status: Home / Self Care | Payer: Self-pay | Source: Home / Self Care | Attending: Gastroenterology

## 2020-08-08 ENCOUNTER — Ambulatory Visit
Admission: RE | Admit: 2020-08-08 | Discharge: 2020-08-08 | Disposition: A | Discharge status: Home / Self Care | Payer: 59 | Attending: Gastroenterology | Admitting: Gastroenterology

## 2020-08-08 ENCOUNTER — Other Ambulatory Visit: Payer: Self-pay

## 2020-08-08 DIAGNOSIS — K640 First degree hemorrhoids: Secondary | ICD-10-CM | POA: Diagnosis not present

## 2020-08-08 DIAGNOSIS — Z87891 Personal history of nicotine dependence: Secondary | ICD-10-CM | POA: Insufficient documentation

## 2020-08-08 DIAGNOSIS — K635 Polyp of colon: Secondary | ICD-10-CM

## 2020-08-08 DIAGNOSIS — K5730 Diverticulosis of large intestine without perforation or abscess without bleeding: Secondary | ICD-10-CM | POA: Insufficient documentation

## 2020-08-08 DIAGNOSIS — Z1211 Encounter for screening for malignant neoplasm of colon: Secondary | ICD-10-CM | POA: Diagnosis not present

## 2020-08-08 DIAGNOSIS — D123 Benign neoplasm of transverse colon: Secondary | ICD-10-CM | POA: Insufficient documentation

## 2020-08-08 DIAGNOSIS — Z7982 Long term (current) use of aspirin: Secondary | ICD-10-CM | POA: Diagnosis not present

## 2020-08-08 DIAGNOSIS — Z79899 Other long term (current) drug therapy: Secondary | ICD-10-CM | POA: Insufficient documentation

## 2020-08-08 DIAGNOSIS — Z860100 Personal history of colon polyps, unspecified: Secondary | ICD-10-CM

## 2020-08-08 DIAGNOSIS — Z888 Allergy status to other drugs, medicaments and biological substances status: Secondary | ICD-10-CM | POA: Insufficient documentation

## 2020-08-08 DIAGNOSIS — Z791 Long term (current) use of non-steroidal anti-inflammatories (NSAID): Secondary | ICD-10-CM | POA: Insufficient documentation

## 2020-08-08 HISTORY — PX: COLONOSCOPY WITH PROPOFOL: SHX5780

## 2020-08-08 HISTORY — PX: POLYPECTOMY: SHX5525

## 2020-08-08 SURGERY — COLONOSCOPY WITH PROPOFOL
Anesthesia: General

## 2020-08-08 MED ORDER — LACTATED RINGERS IV SOLN: INTRAVENOUS | Status: DC

## 2020-08-08 MED ORDER — FENTANYL CITRATE (PF) 100 MCG/2ML IJ SOLN: 25.0000 ug | INTRAMUSCULAR | Status: DC | PRN

## 2020-08-08 MED ORDER — OXYCODONE HCL 5 MG/5ML PO SOLN: 5.0000 mg | Freq: Once | ORAL | Status: DC | PRN

## 2020-08-08 MED ORDER — PROMETHAZINE HCL 25 MG/ML IJ SOLN: 6.2500 mg | INTRAMUSCULAR | Status: DC | PRN

## 2020-08-08 MED ORDER — OXYCODONE HCL 5 MG PO TABS
5.0000 mg | ORAL_TABLET | Freq: Once | ORAL | Status: DC | PRN
Start: 2020-08-08 — End: 2020-08-08

## 2020-08-08 MED ORDER — LIDOCAINE HCL (CARDIAC) PF 100 MG/5ML IV SOSY
PREFILLED_SYRINGE | INTRAVENOUS | Status: DC | PRN
  Administered 2020-08-08: 30 mg via INTRAVENOUS

## 2020-08-08 MED ORDER — SODIUM CHLORIDE 0.9 % IV SOLN: INTRAVENOUS | Status: DC

## 2020-08-08 MED ORDER — MEPERIDINE HCL 25 MG/ML IJ SOLN: 6.2500 mg | INTRAMUSCULAR | Status: DC | PRN

## 2020-08-08 MED ORDER — PROPOFOL 10 MG/ML IV BOLUS
INTRAVENOUS | Status: DC | PRN
  Administered 2020-08-08 (×2): 30 mg via INTRAVENOUS
  Administered 2020-08-08: 150 mg via INTRAVENOUS
  Administered 2020-08-08 (×2): 30 mg via INTRAVENOUS
  Administered 2020-08-08: 50 mg via INTRAVENOUS

## 2020-08-08 SURGICAL SUPPLY — 25 items
CLIP HMST 235XBRD CATH ROT (MISCELLANEOUS) IMPLANT
CLIP RESOLUTION 360 11X235 (MISCELLANEOUS)
ELECT REM PT RETURN 9FT ADLT (ELECTROSURGICAL)
ELECTRODE REM PT RTRN 9FT ADLT (ELECTROSURGICAL) IMPLANT
FCP ESCP3.2XJMB 240X2.8X (MISCELLANEOUS)
FORCEPS BIOP RAD 4 LRG CAP 4 (CUTTING FORCEPS) ×3 IMPLANT
FORCEPS BIOP RJ4 240 W/NDL (MISCELLANEOUS)
FORCEPS ESCP3.2XJMB 240X2.8X (MISCELLANEOUS) IMPLANT
GOWN CVR UNV OPN BCK APRN NK (MISCELLANEOUS) ×4 IMPLANT
GOWN ISOL THUMB LOOP REG UNIV (MISCELLANEOUS) ×6
INJECTOR VARIJECT VIN23 (MISCELLANEOUS) IMPLANT
KIT DEFENDO VALVE AND CONN (KITS) IMPLANT
KIT PRC NS LF DISP ENDO (KITS) ×2 IMPLANT
KIT PROCEDURE OLYMPUS (KITS) ×3
MANIFOLD NEPTUNE II (INSTRUMENTS) ×3 IMPLANT
MARKER SPOT ENDO TATTOO 5ML (MISCELLANEOUS) IMPLANT
PROBE APC STR FIRE (PROBE) IMPLANT
RETRIEVER NET ROTH 2.5X230 LF (MISCELLANEOUS) IMPLANT
SNARE SHORT THROW 13M SML OVAL (MISCELLANEOUS) IMPLANT
SNARE SHORT THROW 30M LRG OVAL (MISCELLANEOUS) IMPLANT
SNARE SNG USE RND 15MM (INSTRUMENTS) IMPLANT
SPOT EX ENDOSCOPIC TATTOO (MISCELLANEOUS)
TRAP ETRAP POLY (MISCELLANEOUS) IMPLANT
VARIJECT INJECTOR VIN23 (MISCELLANEOUS)
WATER STERILE IRR 250ML POUR (IV SOLUTION) ×3 IMPLANT

## 2020-08-08 NOTE — Anesthesia Preprocedure Evaluation (Signed)
Anesthesia Evaluation  Patient identified by MRN, date of birth, ID band Patient awake    Reviewed: Allergy & Precautions, H&P , NPO status , Patient's Chart, lab work & pertinent test results, reviewed documented beta blocker date and time   Airway Mallampati: II  TM Distance: >3 FB Neck ROM: full    Dental no notable dental hx.    Pulmonary neg pulmonary ROS, former smoker,    Pulmonary exam normal breath sounds clear to auscultation       Cardiovascular Exercise Tolerance: Good hypertension, + CAD   Rhythm:regular Rate:Normal     Neuro/Psych negative neurological ROS  negative psych ROS   GI/Hepatic Neg liver ROS, GERD  ,  Endo/Other  negative endocrine ROS  Renal/GU negative Renal ROS  negative genitourinary   Musculoskeletal   Abdominal   Peds  Hematology negative hematology ROS (+)   Anesthesia Other Findings   Reproductive/Obstetrics negative OB ROS                             Anesthesia Physical Anesthesia Plan  ASA: II  Anesthesia Plan: General   Post-op Pain Management:    Induction:   PONV Risk Score and Plan: 2 and Propofol infusion, TIVA and Treatment may vary due to age or medical condition  Airway Management Planned:   Additional Equipment:   Intra-op Plan:   Post-operative Plan:   Informed Consent: I have reviewed the patients History and Physical, chart, labs and discussed the procedure including the risks, benefits and alternatives for the proposed anesthesia with the patient or authorized representative who has indicated his/her understanding and acceptance.     Dental Advisory Given  Plan Discussed with: CRNA  Anesthesia Plan Comments:         Anesthesia Quick Evaluation

## 2020-08-08 NOTE — Transfer of Care (Signed)
Immediate Anesthesia Transfer of Care Note  Patient: Richard Mueller [facility]  Procedure(s) Performed: COLONOSCOPY WITH PROPOFOL (N/A ) POLYPECTOMY  Patient Location: PACU  Anesthesia Type: General  Level of Consciousness: awake, alert  and patient cooperative  Airway and Oxygen Therapy: Patient Spontanous Breathing and Patient connected to supplemental oxygen  Post-op Assessment: Post-op Vital signs reviewed, Patient's Cardiovascular Status Stable, Respiratory Function Stable, Patent Airway and No signs of Nausea or vomiting  Post-op Vital Signs: Reviewed and stable  Complications: No complications documented.

## 2020-08-08 NOTE — Anesthesia Postprocedure Evaluation (Signed)
Anesthesia Post Note  Patient: Richard Mueller [facility]  Procedure(s) Performed: COLONOSCOPY WITH PROPOFOL (N/A ) POLYPECTOMY     Patient location during evaluation: PACU Anesthesia Type: General Level of consciousness: awake and alert Pain management: pain level controlled Vital Signs Assessment: post-procedure vital signs reviewed and stable Respiratory status: spontaneous breathing, nonlabored ventilation, respiratory function stable and patient connected to nasal cannula oxygen Cardiovascular status: blood pressure returned to baseline and stable Postop Assessment: no apparent nausea or vomiting Anesthetic complications: no   No complications documented.  Richard Mueller, [name]

## 2020-08-08 NOTE — H&P (Signed)
Richard Lame, MD [address] [place], [place] [address] Phone:[phone] Fax : [phone]  Primary Care Physician:  Steele Sizer, MD Primary Gastroenterologist:  Dr. Allen Norris  Pre-Procedure History & Physical: HPI:  Richard Mueller is a 64 y.o. male is here for an colonoscopy.   Past Medical History:  Diagnosis Date  . Allergic rhinitis, cause unspecified   . Allergy   . Bronchitis   . Esophageal reflux   . GERD (gastroesophageal reflux disease)   . Hyperlipidemia   . Hypertension   . Metabolic syndrome   . Obesity, unspecified   . Osteoarthritis   . Osteoarthrosis, unspecified whether generalized or localized, unspecified site   . Right upper quadrant pain     Past Surgical History:  Procedure Laterality Date  . COLONOSCOPY    . LAPAROSCOPIC APPENDECTOMY N/A 11/28/2018   Procedure: APPENDECTOMY LAPAROSCOPIC;  Surgeon: Jules Husbands, MD;  Location: [facility] ORS;  Service: General;  Laterality: N/A;  . ORTHOPEDIC SURGERY    . SKIN CANCER DESTRUCTION Left 03/06/2015   Hand- Dr. Phillip Heal  . SKIN CANCER EXCISION Left 08/2018   Dr. Phillip Heal on Left Arm     Prior to Admission medications   Medication Sig Start Date End Date Taking? Authorizing Provider  Ascorbic Acid (VITAMIN C) 1000 MG tablet Take 500 mg by mouth daily.    Yes [provider]  aspirin 81 MG tablet Take 81 mg by mouth daily.   Yes [provider]  atorvastatin (LIPITOR) 40 MG tablet Take 1 tablet (40 mg total) by mouth daily. 05/07/20 08/05/20 Yes Visser, Jacquelyn D, PA-C  b complex vitamins tablet Take 1 tablet by mouth daily.   Yes [provider]  Calcium 200 MG TABS Take by mouth.   Yes [provider]  cholecalciferol (VITAMIN D3) 25 MCG (1000 UNIT) tablet Take 1,000 Units by mouth daily.   Yes [provider]  fexofenadine (ALLEGRA) 180 MG tablet Take 180 mg by mouth as needed.    Yes [provider]  Ginkgo Biloba 100 MG CAPS  Take by mouth.   Yes [provider]  Magnesium 400 MG CAPS Take 1 capsule by mouth every other day.   Yes [provider]  Multiple Vitamin (MULTIVITAMIN) tablet Take 1 tablet by mouth daily.   Yes [provider]  naproxen (NAPROSYN) 500 MG tablet Take 500 mg by mouth 2 (two) times daily with a meal.   Yes [provider]  Omega-3 Fatty Acids (FISH OIL) 1000 MG CAPS Take by mouth.   Yes [provider]  carvedilol (COREG) 3.125 MG tablet Take 1 tablet (3.125 mg total) by mouth 2 (two) times daily. 05/07/20 08/05/20  Marrianne Mood D, PA-C  losartan (COZAAR) 50 MG tablet TAKE 1 TABLET BY MOUTH DAILY. 04/25/20   Wellington Hampshire, MD    Allergies as of 06/25/2020 - Review Complete 06/25/2020  Allergen Reaction Noted  . Lisinopril  01/02/2013    Family History  Problem Relation Age of Onset  . Heart disease Mother   . Heart attack Mother   . COPD Mother   . Heart disease Father   . Hypertension Father   . COPD Father   . Cancer Father         Prostate and Skin  . COPD Sister   . Suicidality Brother   . Hypertension Daughter   . Heart attack Brother 54       2014  . Depression Daughter  Social History   Socioeconomic History  . Marital status: Married    Spouse name: Butch Penny  . Number of children: 2  . Years of education: Not on file  . Highest education level: Some college, no degree  Occupational History  . Not on file  Tobacco Use  . Smoking status: Former Smoker    Packs/day: 1.00    Years: 15.00    Pack years: 15.00    Types: Cigarettes    Start date: 08/24/1987    Quit date: 08/26/2002    Years since quitting: 17.9  . Smokeless tobacco: Never Used  Vaping Use  . Vaping Use: Never used  Substance and Sexual Activity  . Alcohol use: Yes    Alcohol/week: 0.0 standard drinks    Comment: ocassional  . Drug use: No  . Sexual activity: Yes    Partners: Female    Birth control/protection: None  Other Topics Concern   . Not on file  Social History Narrative  . Not on file   Social Determinants of Health   Financial Resource Strain: Not on file  Food Insecurity: Not on file  Transportation Needs: Not on file  Physical Activity: Not on file  Stress: Not on file  Social Connections: Not on file  Intimate Partner Violence: Not on file    Review of Systems: See HPI, otherwise negative ROS  Physical Exam: There were no vitals taken for this visit. General:   Alert,  pleasant and cooperative in NAD Head:  Normocephalic and atraumatic. Neck:  Supple; no masses or thyromegaly. Lungs:  Clear throughout to auscultation.    Heart:  Regular rate and rhythm. Abdomen:  Soft, nontender and nondistended. Normal bowel sounds, without guarding, and without rebound.   Neurologic:  Alert and  oriented x4;  grossly normal neurologically.  Impression/Plan: Richard Mueller is here for an colonoscopy to be performed for a history of adenomatous polyps in 2015   Risks, benefits, limitations, and alternatives regarding  colonoscopy have been reviewed with the patient.  Questions have been answered.  All parties agreeable.   Richard Lame, MD  08/08/2020, 9:19 AM

## 2020-08-08 NOTE — Op Note (Signed)
[facility] Gastroenterology Patient Name: Richard Mueller Procedure Date: 08/08/2020 10:01 AM MRN: [id number] Account #: [id number] Date of Birth: 1956-03-28 Admit Type: Outpatient Age: 64 Room: [facility] OR ROOM 01 Gender: Male Note Status: Finalized Procedure:             Colonoscopy Indications:           High risk colon cancer surveillance: Personal history                         of colonic polyps Providers:             [name] MD, MD Referring MD:          Bethena Roys. Sowles, MD (Referring MD) Medicines:             Propofol per Anesthesia Complications:         No immediate complications. Procedure:             Pre-Anesthesia Assessment:                        - Prior to the procedure, a History and Physical was                         performed, and patient medications and allergies were                         reviewed. The patient's tolerance of previous                         anesthesia was also reviewed. The risks and benefits                         of the procedure and the sedation options and risks                         were discussed with the patient. All questions were                         answered, and informed consent was obtained. Prior                         Anticoagulants: The patient has taken no previous                         anticoagulant or antiplatelet agents. ASA Grade                         Assessment: II - A patient with mild systemic disease.                         After reviewing the risks and benefits, the patient                         was deemed in satisfactory condition to undergo the                         procedure.  After obtaining informed consent, the colonoscope was                         passed under direct vision. Throughout the procedure,                         the patient's blood pressure, pulse, and oxygen                         saturations were monitored continuously. The was                          introduced through the anus and advanced to the the                         cecum, identified by appendiceal orifice and ileocecal                         valve. The colonoscopy was performed without                         difficulty. The patient tolerated the procedure well.                         The quality of the bowel preparation was excellent. Findings:      The perianal and digital rectal examinations were normal.      A 3 mm polyp was found in the transverse colon. The polyp was sessile.       The polyp was removed with a cold biopsy forceps. Resection and       retrieval were complete.      A 3 mm polyp was found in the sigmoid colon. The polyp was sessile. The       polyp was removed with a cold biopsy forceps. Resection and retrieval       were complete.      Non-bleeding internal hemorrhoids were found during retroflexion. The       hemorrhoids were Grade I (internal hemorrhoids that do not prolapse).      A few small-mouthed diverticula were found in the entire colon. Impression:            - One 3 mm polyp in the transverse colon, removed with                         a cold biopsy forceps. Resected and retrieved.                        - One 3 mm polyp in the sigmoid colon, removed with a                         cold biopsy forceps. Resected and retrieved.                        - Non-bleeding internal hemorrhoids.                        - Diverticulosis in the entire examined colon. Recommendation:        - Discharge patient to home.                        -  Resume previous diet.                        - Continue present medications.                        - Await pathology results.                        - Repeat colonoscopy in 7 years for surveillance. Procedure Code(s):     --- Professional ---                        [id number], Colonoscopy, flexible; with biopsy, single or                         multiple Diagnosis Code(s):     --- Professional ---                         Z86.010, Personal history of colonic polyps                        K63.5, Polyp of colon CPT copyright 2019 American Medical Association. All rights reserved. The codes documented in this report are preliminary and upon coder review may  be revised to meet current compliance requirements. [name] MD, MD 08/08/2020 10:24:42 AM This report has been signed electronically. Number of Addenda: 0 Note Initiated On: 08/08/2020 10:01 AM Scope Withdrawal Time: 0 hours 8 minutes 0 seconds  Total Procedure Duration: 0 hours 10 minutes 35 seconds  Estimated Blood Loss:  Estimated blood loss: none.      [facility]

## 2020-08-08 NOTE — Anesthesia Procedure Notes (Signed)
Date/Time: 08/08/2020 10:08 AM Performed by: Cameron Ali, CRNA Pre-anesthesia Checklist: Patient identified, Emergency Drugs available, Suction available, Timeout performed and Patient being monitored Patient Re-evaluated:Patient Re-evaluated prior to induction Oxygen Delivery Method: Nasal cannula Placement Confirmation: positive ETCO2

## 2020-08-11 ENCOUNTER — Encounter: Payer: Self-pay | Admitting: Gastroenterology

## 2020-08-13 LAB — SURGICAL PATHOLOGY

## 2020-08-18 ENCOUNTER — Other Ambulatory Visit: Payer: 59

## 2020-08-18 ENCOUNTER — Encounter: Payer: Self-pay | Admitting: Gastroenterology

## 2020-08-18 DIAGNOSIS — Z20822 Contact with and (suspected) exposure to covid-19: Secondary | ICD-10-CM

## 2020-08-19 ENCOUNTER — Ambulatory Visit: Payer: 59

## 2020-08-19 LAB — NOVEL CORONAVIRUS, NAA: SARS-CoV-2, NAA: NOT DETECTED

## 2020-08-19 LAB — SARS-COV-2, NAA 2 DAY TAT

## 2020-08-26 ENCOUNTER — Ambulatory Visit: Payer: 59

## 2020-08-27 ENCOUNTER — Other Ambulatory Visit: Payer: Self-pay

## 2020-08-27 ENCOUNTER — Ambulatory Visit (INDEPENDENT_AMBULATORY_CARE_PROVIDER_SITE_OTHER): Payer: BC Managed Care – PPO | Admitting: Emergency Medicine

## 2020-08-27 DIAGNOSIS — Z23 Encounter for immunization: Secondary | ICD-10-CM | POA: Diagnosis not present

## 2020-08-28 DIAGNOSIS — M47816 Spondylosis without myelopathy or radiculopathy, lumbar region: Secondary | ICD-10-CM | POA: Diagnosis not present

## 2020-09-03 ENCOUNTER — Other Ambulatory Visit: Payer: Self-pay | Admitting: Cardiovascular Disease

## 2020-09-03 DIAGNOSIS — I10 Essential (primary) hypertension: Secondary | ICD-10-CM

## 2020-09-03 NOTE — Telephone Encounter (Signed)
Requested Prescriptions   Signed Prescriptions Disp Refills  . losartan (COZAAR) 50 MG tablet 30 tablet 3    Sig: TAKE 1 TABLET BY MOUTH DAILY.    Authorizing Provider: ARIDA, MUHAMMAD A    Ordering User: Arneta Mahmood C    

## 2020-09-03 NOTE — Telephone Encounter (Signed)
Requested Prescriptions   Signed Prescriptions Disp Refills  . losartan (COZAAR) 50 MG tablet 30 tablet 3    Sig: TAKE 1 TABLET BY MOUTH DAILY.    Authorizing Provider: Kathlyn Sacramento A    Ordering User: Britt Bottom

## 2020-09-11 DIAGNOSIS — I10 Essential (primary) hypertension: Secondary | ICD-10-CM | POA: Diagnosis not present

## 2020-09-11 DIAGNOSIS — M4306 Spondylolysis, lumbar region: Secondary | ICD-10-CM | POA: Diagnosis not present

## 2020-09-11 DIAGNOSIS — Z6830 Body mass index (BMI) 30.0-30.9, adult: Secondary | ICD-10-CM | POA: Diagnosis not present

## 2020-09-15 ENCOUNTER — Telehealth: Payer: Self-pay | Admitting: Cardiovascular Disease

## 2020-09-15 NOTE — Telephone Encounter (Signed)
error 

## 2020-09-15 NOTE — Telephone Encounter (Signed)
Patient returned [name]'s call, will await a call back. The best contact number is [phone].   Thank you!

## 2020-09-15 NOTE — Telephone Encounter (Signed)
Left message to call back on 09/15/2020 at 1433.  Patient needs call back.

## 2020-09-16 ENCOUNTER — Other Ambulatory Visit: Payer: Self-pay | Admitting: Neurological Surgery

## 2020-09-16 NOTE — Telephone Encounter (Signed)
   Primary Cardiologist: Kathlyn Sacramento, MD  Chart reviewed as part of pre-operative protocol coverage. Given past medical history and time since last visit, based on ACC/AHA guidelines, Richard Mueller would be at acceptable risk for the planned procedure without further cardiovascular testing.   His RCRI is a class II risk, 0.9% risk of major cardiac event.  He is able to complete greater than 4 METS of physical activity.  Patient was advised that if he develops new symptoms prior to surgery to contact our office to arrange a follow-up appointment.  He verbalized understanding.  I will route this recommendation to the requesting party via Epic fax function and remove from pre-op pool.  Please call with questions.  [name]. [name] NP-C    09/16/2020, 8:22 AM [name] Office [phone] Fax [phone]

## 2020-10-07 ENCOUNTER — Other Ambulatory Visit: Payer: Self-pay | Admitting: Neurological Surgery

## 2020-10-07 NOTE — Progress Notes (Signed)
Surgical Instructions    Your procedure is scheduled on Friday, February 18th.  Report to [facility] Main Entrance "A" at 5:30 A.M., then check in with the Admitting office.  Call this number if you have problems the morning of surgery:  [phone]   If you have any questions prior to your surgery date call [phone]: Open Monday-Friday 8am-4pm    Remember:  Do not eat after midnight the night before your surgery  You may drink clear liquids until 4:30AM the morning of your surgery.   Clear liquids allowed are: Water, Non-Citrus Juices (without pulp), Carbonated Beverages, Clear Tea, Black Coffee Only, and Gatorade    Take these medicines the morning of surgery with A SIP OF WATER  Atorvastatin (Lipitor)  Carvedilol (Coreg)  Gabapentin (Neurontin)    Follow your surgeon's instructions on when to stop Aspirin.  If no instructions were given by your surgeon then you will need to call the office to get those instructions.    As of today, STOP taking Aleve, Naproxen, Ibuprofen, Motrin, Advil, Goody's, BC's, all herbal medications, fish oil, and all vitamins.                     Do not wear jewelry            Do not wear lotions, powders, colognes, or deodorant.            Men may shave face and neck.            Do not bring valuables to the hospital.            [facility] is not responsible for any belongings or valuables.  Do NOT Smoke (Tobacco/Vaping) or drink Alcohol 24 hours prior to your procedure If you use a CPAP at night, you may bring all equipment for your overnight stay.   Contacts, glasses, dentures or bridgework may not be worn into surgery, please bring cases for these belongings   For patients admitted to the hospital, discharge time will be determined by your treatment team.   Patients discharged the day of surgery will not be allowed to drive home, and someone needs to stay with them for 24 hours.    Special instructions:   [facility]- Preparing For  Surgery  Before surgery, you can play an important role. Because skin is not sterile, your skin needs to be as free of germs as possible. You can reduce the number of germs on your skin by washing with CHG (chlorahexidine gluconate) Soap before surgery.  CHG is an antiseptic cleaner which kills germs and bonds with the skin to continue killing germs even after washing.    Oral Hygiene is also important to reduce your risk of infection.  Remember - BRUSH YOUR TEETH THE MORNING OF SURGERY WITH YOUR REGULAR TOOTHPASTE  Please do not use if you have an allergy to CHG or antibacterial soaps. If your skin becomes reddened/irritated stop using the CHG.  Do not shave (including legs and underarms) for at least 48 hours prior to first CHG shower. It is OK to shave your face.  Please follow these instructions carefully.   1. Shower the NIGHT BEFORE SURGERY and the MORNING OF SURGERY  2. If you chose to wash your hair, wash your hair first as usual with your normal shampoo.  3. After you shampoo, rinse your hair and body thoroughly to remove the shampoo.  4. Wash Face and genitals (private parts) with your normal soap.  5.  Shower the NIGHT BEFORE SURGERY and the MORNING OF SURGERY with CHG Soap.   6. Use CHG Soap as you would any other liquid soap. You can apply CHG directly to the skin and wash gently with a scrungie or a clean washcloth.   7. Apply the CHG Soap to your body ONLY FROM THE NECK DOWN.  Do not use on open wounds or open sores. Avoid contact with your eyes, ears, mouth and genitals (private parts). Wash Face and genitals (private parts)  with your normal soap.   8. Wash thoroughly, paying special attention to the area where your surgery will be performed.  9. Thoroughly rinse your body with warm water from the neck down.  10. DO NOT shower/wash with your normal soap after using and rinsing off the CHG Soap.  11. Pat yourself dry with a CLEAN TOWEL.  12. Wear CLEAN PAJAMAS to bed  the night before surgery  13. Place CLEAN SHEETS on your bed the night before your surgery  14. DO NOT SLEEP WITH PETS.   Day of Surgery: Wear Clean/Comfortable clothing the morning of surgery Do not apply any deodorants/lotions.   Remember to brush your teeth WITH YOUR REGULAR TOOTHPASTE.   Please read over the following fact sheets that you were given.

## 2020-10-08 ENCOUNTER — Other Ambulatory Visit
Admission: RE | Admit: 2020-10-08 | Discharge: 2020-10-08 | Disposition: A | Discharge status: Home / Self Care | Payer: BC Managed Care – PPO | Source: Ambulatory Visit | Attending: Neurological Surgery | Admitting: Neurological Surgery

## 2020-10-08 ENCOUNTER — Encounter (HOSPITAL_COMMUNITY)
Admission: RE | Admit: 2020-10-08 | Discharge: 2020-10-08 | Disposition: A | Discharge status: Home / Self Care | Payer: BC Managed Care – PPO | Source: Ambulatory Visit | Attending: Neurological Surgery | Admitting: Neurological Surgery

## 2020-10-08 ENCOUNTER — Encounter (HOSPITAL_COMMUNITY): Payer: Self-pay

## 2020-10-08 ENCOUNTER — Other Ambulatory Visit: Payer: Self-pay

## 2020-10-08 DIAGNOSIS — Z20822 Contact with and (suspected) exposure to covid-19: Secondary | ICD-10-CM | POA: Insufficient documentation

## 2020-10-08 DIAGNOSIS — Z01812 Encounter for preprocedural laboratory examination: Secondary | ICD-10-CM | POA: Diagnosis not present

## 2020-10-08 DIAGNOSIS — I2510 Atherosclerotic heart disease of native coronary artery without angina pectoris: Secondary | ICD-10-CM | POA: Insufficient documentation

## 2020-10-08 DIAGNOSIS — Z79899 Other long term (current) drug therapy: Secondary | ICD-10-CM | POA: Insufficient documentation

## 2020-10-08 HISTORY — DX: Pneumonia, unspecified organism: J18.9

## 2020-10-08 LAB — TYPE AND SCREEN
ABO/RH(D): A POS
Antibody Screen: NEGATIVE

## 2020-10-08 LAB — BASIC METABOLIC PANEL
Anion gap: 7 (ref 5–15)
BUN: 16 mg/dL (ref 8–23)
CO2: 26 mmol/L (ref 22–32)
Calcium: 9.3 mg/dL (ref 8.9–10.3)
Chloride: 104 mmol/L (ref 98–111)
Creatinine, Ser: 0.97 mg/dL (ref 0.61–1.24)
GFR, Estimated: >60 mL/min (ref >60)
Glucose, Bld: 89 mg/dL (ref 70–99)
Potassium: 4.1 mmol/L (ref 3.5–5.1)
Sodium: 137 mmol/L (ref 135–145)

## 2020-10-08 LAB — CBC
HCT: 41.5 % (ref 39.0–52.0)
Hemoglobin: 14.2 g/dL (ref 13.0–17.0)
MCH: 31 pg (ref 26.0–34.0)
MCHC: 34.2 g/dL (ref 30.0–36.0)
MCV: 90.6 fL (ref 80.0–100.0)
Platelets: 210 10*3/uL (ref 150–400)
RBC: 4.58 MIL/uL (ref 4.22–5.81)
RDW: 13.3 % (ref 11.5–15.5)
WBC: 5.5 10*3/uL (ref 4.0–10.5)
nRBC: 0 % (ref 0.0–0.2)

## 2020-10-08 LAB — SURGICAL PCR SCREEN
MRSA, PCR: NEGATIVE
Staphylococcus aureus: NEGATIVE

## 2020-10-08 NOTE — Progress Notes (Signed)
PCP - Steele Sizer, MD Cardiologist - Rogue Jury A. Fletcher Anon, MD  PPM/ICD - Denies Device Orders - N/A Rep Notified - N/A  Chest x-ray - 11/27/19 EKG - 03/21/20 Stress Test - 01/05/13; 03/28/20  ECHO - 04/21/20 Cardiac Cath - Denies  Sleep Study - N/A  Patient denies being diabetic.  Blood Thinner Instructions: N/A Aspirin Instructions: Per pt., last dose 10/03/20  ERAS Protcol - N/A PRE-SURGERY Ensure or G2- N/A  COVID TEST- 10/10/20 @ [facility]   Anesthesia review: Yes, review ECO, Stress test; abnormal EKG.  Patient denies shortness of breath, fever, cough and chest pain at PAT appointment   All instructions explained to the patient, with a verbal understanding of the material. Patient agrees to go over the instructions while at home for a better understanding. Patient also instructed to self quarantine after being tested for COVID-19. The opportunity to ask questions was provided.

## 2020-10-08 NOTE — Progress Notes (Signed)
Surgical Instructions:    Your procedure is scheduled on Friday, February 18th (07:30 AM- 12:01 PM).  Report to [facility] Main Entrance "A" at 5:30 A.M., then check in with the Admitting office.  Call this number if you have problems the morning of surgery:  [phone]   If you have any questions prior to your surgery date call [phone]: Open Monday-Friday 8am-4pm    Remember:  Do not eat or drink after midnight the night before your surgery.    Take these medicines the morning of surgery with A SIP OF WATER:  Atorvastatin (Lipitor)  Carvedilol (Coreg)  Gabapentin (Neurontin)    Follow your surgeon's instructions on when to stop Aspirin.  If no instructions were given by your surgeon then you will need to call the office to get those instructions.    As of today, STOP taking Aleve, Naproxen, Ibuprofen, Motrin, Advil, Goody's, BC's, all herbal medications, fish oil, and all vitamins.          The Morning of Surgery:            Do not wear jewelry            Do not wear lotions, powders, colognes, or deodorant.            Men may shave face and neck.            Do not bring valuables to the hospital.            [facility] is not responsible for any belongings or valuables.  Do NOT Smoke (Tobacco/Vaping) or drink Alcohol 24 hours prior to your procedure.  If you use a CPAP at night, you may bring all equipment for your overnight stay.   Contacts, glasses, dentures or bridgework may not be worn into surgery, please bring cases for these belongings   For patients admitted to the hospital, discharge time will be determined by your treatment team.   Patients discharged the day of surgery will not be allowed to drive home, and someone needs to stay with them for 24 hours.    Special instructions:   [place]- Preparing For Surgery  Before surgery, you can play an important role. Because skin is not sterile, your skin needs to be as free of germs as possible. You can  reduce the number of germs on your skin by washing with CHG (chlorahexidine gluconate) Soap before surgery.  CHG is an antiseptic cleaner which kills germs and bonds with the skin to continue killing germs even after washing.    Oral Hygiene is also important to reduce your risk of infection.  Remember - BRUSH YOUR TEETH THE MORNING OF SURGERY WITH YOUR REGULAR TOOTHPASTE  Please do not use if you have an allergy to CHG or antibacterial soaps. If your skin becomes reddened/irritated stop using the CHG.  Do not shave (including legs and underarms) for at least 48 hours prior to first CHG shower. It is OK to shave your face.  Please follow these instructions carefully.   1. Shower the NIGHT BEFORE SURGERY and the MORNING OF SURGERY  2. If you chose to wash your hair, wash your hair first as usual with your normal shampoo.  3. After you shampoo, rinse your hair and body thoroughly to remove the shampoo.  4. Wash Face and genitals (private parts) with your normal soap.   5.  Shower the NIGHT BEFORE SURGERY and the MORNING OF SURGERY with CHG Soap.   6. Use CHG  Soap as you would any other liquid soap. You can apply CHG directly to the skin and wash gently with a scrungie or a clean washcloth.   7. Apply the CHG Soap to your body ONLY FROM THE NECK DOWN.  Do not use on open wounds or open sores. Avoid contact with your eyes, ears, mouth and genitals (private parts). Wash Face and genitals (private parts)  with your normal soap.   8. Wash thoroughly, paying special attention to the area where your surgery will be performed.  9. Thoroughly rinse your body with warm water from the neck down.  10. DO NOT shower/wash with your normal soap after using and rinsing off the CHG Soap.  11. Pat yourself dry with a CLEAN TOWEL.  12. Wear CLEAN PAJAMAS to bed the night before surgery  13. Place CLEAN SHEETS on your bed the night before your surgery  14. DO NOT SLEEP WITH PETS.   Day of  Surgery: SHOWER Wear Clean/Comfortable clothing the morning of surgery Do not apply any deodorants/lotions.   Remember to brush your teeth WITH YOUR REGULAR TOOTHPASTE.   Please read over the following fact sheets that you were given.

## 2020-10-09 ENCOUNTER — Other Ambulatory Visit: Payer: Self-pay | Admitting: Neurological Surgery

## 2020-10-09 LAB — SARS CORONAVIRUS 2 (TAT 6-24 HRS): SARS Coronavirus 2: NEGATIVE

## 2020-10-09 NOTE — Anesthesia Preprocedure Evaluation (Addendum)
Anesthesia Evaluation  Patient identified by MRN, date of birth, ID band Patient awake    Reviewed: Allergy & Precautions, NPO status , Patient's Chart, lab work & pertinent test results  Airway Mallampati: III  TM Distance: >3 FB Neck ROM: Full    Dental no notable dental hx.    Pulmonary former smoker,    Pulmonary exam normal breath sounds clear to auscultation       Cardiovascular hypertension, Pt. on medications Normal cardiovascular exam Rhythm:Regular Rate:Normal     Neuro/Psych negative neurological ROS  negative psych ROS   GI/Hepatic Neg liver ROS,   Endo/Other  negative endocrine ROS  Renal/GU negative Renal ROS     Musculoskeletal  (+) Arthritis ,   Abdominal (+) + obese,   Peds  Hematology negative hematology ROS (+)   Anesthesia Other Findings Pars defect of lumbar spine  Reproductive/Obstetrics                           Anesthesia Physical Anesthesia Plan  ASA: II  Anesthesia Plan: General   Post-op Pain Management:    Induction: Intravenous  PONV Risk Score and Plan: 2 and Ondansetron, Dexamethasone, Midazolam and Treatment may vary due to age or medical condition  Airway Management Planned: Oral ETT  Additional Equipment:   Intra-op Plan:   Post-operative Plan: Extubation in OR  Informed Consent: I have reviewed the patients History and Physical, chart, labs and discussed the procedure including the risks, benefits and alternatives for the proposed anesthesia with the patient or authorized representative who has indicated his/her understanding and acceptance.     Dental advisory given  Plan Discussed with: CRNA  Anesthesia Plan Comments: (PAT note by Karoline Caldwell, PA-C: Follows with cardiology for history of atypical chest pain, CAD, SVT.  Recently underwent thorough work-up for evaluation of atypical chest pain. Echo showed normal pump function 55 to 60%  and no regional wall motion abnormalities.  Lexiscan performed with recommendation for further testing given poor specificity and sensitivity of results.  Cardiac CTA showed high coronary calcium score of 857, placing him at the 91st percentile. Subsequent  FFR completed and normal with recommendation to consider symptom guided anti-ischemic pharmacotherapy and risk factor modification. Escalate GDMT as tolerated. Continue ASA 81mg  daily.  He was started on low-dose Coreg for HR/BP control and antianginal relief.  Cardiac clearance per telephone encounter 09/16/2020, "Chart reviewed as part of pre-operative protocol coverage. Given past medical history and time since last visit, based on ACC/AHA guidelines, [facility] would be at acceptable risk for the planned procedure without further cardiovascular testing. His RCRI is a class II risk, 0.9% risk of major cardiac event.  He is able to complete greater than 4 METS of physical activity."  Preop labs reviewed, unremarkable.  EKG 03/21/2020: NSR.  Rate 89.  LAFB.  Minimal voltage criteria for LVH, may be normal variant.  Cardiac CTA 05/01/20 IMPRESSION: 1. High coronary calcium score of 857. This was 91st percentile for age and sex matched control ([place]). 2. Normal coronary origin with right dominance. 3. Calcified and non calcified plaque in the proximal LAD causing moderate stenosis. 4. Calcified plaque in the proximal LCx and distal LM causing mild stenosis 5. CAD-RADS 3. Moderate stenosis. Consider symptom-guided anti-ischemic pharmacotherapy as well as risk factor modification per guideline directed care. Additional analysis with CT FFR will be submitted and reported separately.  1. Left Main: No significant stenosis. 2. LAD: No  significant stenosis. FFR 0.9 proximally, 0.84 distally 3. LCX: No significant stenosis. FFR 0.91 4. RCA: No significant stenosis. FFR 0.9 IMPRESSION: 1. CT FFR analysis didn't show any  significant stenosis.  Echo 04/21/2020 1. Left ventricular ejection fraction, by estimation, is 55 to 60%. The  left ventricle has normal function. The left ventricle has no regional  wall motion abnormalities. Left ventricular diastolic parameters were  normal. The average left ventricular  global longitudinal strain is -15.9 %.  2. Right ventricular systolic function is normal. The right ventricular  size is normal. There is normal pulmonary artery systolic pressure.  3. The mitral valve is normal in structure. Mild mitral valve  regurgitation. No evidence of mitral stenosis.  4. The aortic valve is tricuspid. Aortic valve regurgitation is not  visualized. Mild aortic valve sclerosis is present, with no evidence of  aortic valve stenosis.  5. The inferior vena cava is normal in size with greater than 50%  respiratory variability, suggesting right atrial pressure of 3 mmHg.   Cardiac monitoring/ Zio 03/28/20 Normal sinus rhythm with an average heart rate of 71 bpm. 1 run of wide-complex tachycardia lasting only 4 beats likely SVT with aberrancy although slow nonsustained ventricular tachycardia cannot be excluded. 6 episodes of SVT noted the longest lasted 16 beats. Rare PACs and rare PVCs. Most triggered events did not correlate with arrhythmia.  Lexiscan 2014  Nl study Summary  1. Clinical correlation is recommended.  2. Exercise myocardial perfusion study with no significant ischemia.  3. No significant wall motion abnormality noted.  4. Overall, this is a Low risk scan.  5. The estimated ejection fraction is 61%.  6. The left ventricular global function was normal.  7. There are no EKG changes concerning for ischemia.  8. There is a fixed inferior wall defect with normal wall motion likely  attenutation.  9. There is attenuation artifact noted on this study.  )      Anesthesia Quick Evaluation

## 2020-10-09 NOTE — Progress Notes (Signed)
Anesthesia Chart Review:  Follows with cardiology for history of atypical chest pain, CAD, SVT.  Recently underwent thorough work-up for evaluation of atypical chest pain. Echo showed normal pump function 55 to 60% and no regional wall motion abnormalities.  Lexiscan performed with recommendation for further testing given poor specificity and sensitivity of results.  Cardiac CTA showed high coronary calcium score of 857, placing him at the 91st percentile. Subsequent  FFR completed and normal with recommendation to consider symptom guided anti-ischemic pharmacotherapy and risk factor modification. Escalate GDMT as tolerated. Continue ASA 81mg  daily.  He was started on low-dose Coreg for HR/BP control and antianginal relief.  Cardiac clearance per telephone encounter 09/16/2020, "Chart reviewed as part of pre-operative protocol coverage. Given past medical history and time since last visit, based on ACC/AHA guidelines, Richard Mueller would be at acceptable risk for the planned procedure without further cardiovascular testing. His RCRI is a class II risk, 0.9% risk of major cardiac event.  He is able to complete greater than 4 METS of physical activity."  Preop labs reviewed, unremarkable.  EKG 03/21/2020: NSR.  Rate 89.  LAFB.  Minimal voltage criteria for LVH, may be normal variant.  Cardiac CTA 05/01/20 IMPRESSION: 1. High coronary calcium score of 857. This was 91st percentile for age and sex matched control ([place]). 2. Normal coronary origin with right dominance. 3. Calcified and non calcified plaque in the proximal LAD causing moderate stenosis. 4. Calcified plaque in the proximal LCx and distal LM causing mild stenosis 5. CAD-RADS 3. Moderate stenosis. Consider symptom-guided anti-ischemic pharmacotherapy as well as risk factor modification per guideline directed care. Additional analysis with CT FFR will be submitted and reported separately.  1. Left Main: No significant  stenosis. 2. LAD: No significant stenosis. FFR 0.9 proximally, 0.84 distally 3. LCX: No significant stenosis. FFR 0.91 4. RCA: No significant stenosis. FFR 0.9 IMPRESSION: 1. CT FFR analysis didn't show any significant stenosis.  Echo 04/21/2020 1. Left ventricular ejection fraction, by estimation, is 55 to 60%. The  left ventricle has normal function. The left ventricle has no regional  wall motion abnormalities. Left ventricular diastolic parameters were  normal. The average left ventricular  global longitudinal strain is -15.9 %.  2. Right ventricular systolic function is normal. The right ventricular  size is normal. There is normal pulmonary artery systolic pressure.  3. The mitral valve is normal in structure. Mild mitral valve  regurgitation. No evidence of mitral stenosis.  4. The aortic valve is tricuspid. Aortic valve regurgitation is not  visualized. Mild aortic valve sclerosis is present, with no evidence of  aortic valve stenosis.  5. The inferior vena cava is normal in size with greater than 50%  respiratory variability, suggesting right atrial pressure of 3 mmHg.   Cardiac monitoring/ Zio 03/28/20 Normal sinus rhythm with an average heart rate of 71 bpm. 1 run of wide-complex tachycardia lasting only 4 beats likely SVT with aberrancy although slow nonsustained ventricular tachycardia cannot be excluded. 6 episodes of SVT noted the longest lasted 16 beats. Rare PACs and rare PVCs. Most triggered events did not correlate with arrhythmia.  Lexiscan 2014  Nl study Summary  1. Clinical correlation is recommended.  2. Exercise myocardial perfusion study with no significant ischemia.  3. No significant wall motion abnormality noted.  4. Overall, this is a Low risk scan.  5. The estimated ejection fraction is 61%.  6. The left ventricular global function was normal.  7. There are no EKG  changes concerning for ischemia.  8. There is a fixed inferior wall  defect with normal wall motion likely  attenutation.  9. There is attenuation artifact noted on this study.    Wynonia Musty [facility] Short Stay [facility]/Anesthesiology Phone [phone] 10/09/2020 10:21 AM

## 2020-10-10 ENCOUNTER — Inpatient Hospital Stay (HOSPITAL_COMMUNITY): Payer: BC Managed Care – PPO

## 2020-10-10 ENCOUNTER — Inpatient Hospital Stay (HOSPITAL_COMMUNITY)
Admission: RE | Admit: 2020-10-10 | Discharge: 2020-10-11 | DRG: 455 | Disposition: A | Discharge status: Home / Self Care | Payer: BC Managed Care – PPO | Attending: Neurological Surgery | Admitting: Neurological Surgery

## 2020-10-10 ENCOUNTER — Inpatient Hospital Stay (HOSPITAL_COMMUNITY): Payer: BC Managed Care – PPO | Admitting: Physician Assistant

## 2020-10-10 ENCOUNTER — Other Ambulatory Visit: Payer: Self-pay

## 2020-10-10 ENCOUNTER — Encounter (HOSPITAL_COMMUNITY): Payer: Self-pay | Admitting: Neurological Surgery

## 2020-10-10 ENCOUNTER — Encounter (HOSPITAL_COMMUNITY)
Admission: RE | Disposition: A | Discharge status: Home / Self Care | Payer: Self-pay | Source: Home / Self Care | Attending: Neurological Surgery

## 2020-10-10 ENCOUNTER — Inpatient Hospital Stay (HOSPITAL_COMMUNITY): Payer: BC Managed Care – PPO | Admitting: Anesthesiology

## 2020-10-10 DIAGNOSIS — E669 Obesity, unspecified: Secondary | ICD-10-CM | POA: Diagnosis present

## 2020-10-10 DIAGNOSIS — Z4889 Encounter for other specified surgical aftercare: Secondary | ICD-10-CM | POA: Diagnosis not present

## 2020-10-10 DIAGNOSIS — Z20822 Contact with and (suspected) exposure to covid-19: Secondary | ICD-10-CM | POA: Diagnosis present

## 2020-10-10 DIAGNOSIS — M4317 Spondylolisthesis, lumbosacral region: Principal | ICD-10-CM | POA: Diagnosis present

## 2020-10-10 DIAGNOSIS — E785 Hyperlipidemia, unspecified: Secondary | ICD-10-CM | POA: Diagnosis present

## 2020-10-10 DIAGNOSIS — Z6830 Body mass index (BMI) 30.0-30.9, adult: Secondary | ICD-10-CM | POA: Diagnosis not present

## 2020-10-10 DIAGNOSIS — M5417 Radiculopathy, lumbosacral region: Secondary | ICD-10-CM | POA: Diagnosis present

## 2020-10-10 DIAGNOSIS — I10 Essential (primary) hypertension: Secondary | ICD-10-CM | POA: Diagnosis not present

## 2020-10-10 DIAGNOSIS — K219 Gastro-esophageal reflux disease without esophagitis: Secondary | ICD-10-CM | POA: Diagnosis not present

## 2020-10-10 DIAGNOSIS — Z419 Encounter for procedure for purposes other than remedying health state, unspecified: Secondary | ICD-10-CM

## 2020-10-10 DIAGNOSIS — M4316 Spondylolisthesis, lumbar region: Secondary | ICD-10-CM | POA: Diagnosis not present

## 2020-10-10 DIAGNOSIS — M47896 Other spondylosis, lumbar region: Secondary | ICD-10-CM | POA: Diagnosis not present

## 2020-10-10 DIAGNOSIS — Z85828 Personal history of other malignant neoplasm of skin: Secondary | ICD-10-CM | POA: Diagnosis not present

## 2020-10-10 DIAGNOSIS — M4807 Spinal stenosis, lumbosacral region: Secondary | ICD-10-CM | POA: Diagnosis not present

## 2020-10-10 DIAGNOSIS — Z87891 Personal history of nicotine dependence: Secondary | ICD-10-CM

## 2020-10-10 DIAGNOSIS — Z981 Arthrodesis status: Secondary | ICD-10-CM | POA: Diagnosis not present

## 2020-10-10 DIAGNOSIS — M5416 Radiculopathy, lumbar region: Secondary | ICD-10-CM | POA: Diagnosis not present

## 2020-10-10 HISTORY — PX: TRANSFORAMINAL LUMBAR INTERBODY FUSION W/ MIS 1 LEVEL: SHX6145

## 2020-10-10 LAB — ABO/RH: ABO/RH(D): A POS

## 2020-10-10 SURGERY — MINIMALLY INVASIVE (MIS) TRANSFORAMINAL LUMBAR INTERBODY FUSION (TLIF) 1 LEVEL
Anesthesia: General | Site: Spine Lumbar | Laterality: Left

## 2020-10-10 MED ORDER — CHLORHEXIDINE GLUCONATE CLOTH 2 % EX PADS: 6.0000 | MEDICATED_PAD | Freq: Once | CUTANEOUS | Status: DC

## 2020-10-10 MED ORDER — MENTHOL 3 MG MT LOZG: 1.0000 | LOZENGE | OROMUCOSAL | Status: DC | PRN

## 2020-10-10 MED ORDER — PROPOFOL 10 MG/ML IV BOLUS
INTRAVENOUS | Status: AC
  Filled 2020-10-10: qty 40

## 2020-10-10 MED ORDER — PHENYLEPHRINE HCL-NACL 10-0.9 MG/250ML-% IV SOLN
INTRAVENOUS | Status: DC | PRN
  Administered 2020-10-10: 25 ug/min via INTRAVENOUS

## 2020-10-10 MED ORDER — OXYCODONE HCL 5 MG PO TABS: 5.0000 mg | ORAL_TABLET | ORAL | 0 refills | Status: DC | PRN

## 2020-10-10 MED ORDER — THROMBIN 5000 UNITS EX SOLR
CUTANEOUS | Status: AC
  Filled 2020-10-10: qty 5000

## 2020-10-10 MED ORDER — POLYETHYLENE GLYCOL 3350 17 G PO PACK: 17.0000 g | PACK | Freq: Every day | ORAL | Status: DC | PRN

## 2020-10-10 MED ORDER — SODIUM CHLORIDE 0.9% FLUSH
3.0000 mL | Freq: Two times a day (BID) | INTRAVENOUS | Status: DC
  Administered 2020-10-10: 3 mL via INTRAVENOUS

## 2020-10-10 MED ORDER — LACTATED RINGERS IV SOLN: INTRAVENOUS | Status: DC

## 2020-10-10 MED ORDER — ONDANSETRON HCL 4 MG/2ML IJ SOLN
INTRAMUSCULAR | Status: DC | PRN
  Administered 2020-10-10: 4 mg via INTRAVENOUS

## 2020-10-10 MED ORDER — 0.9 % SODIUM CHLORIDE (POUR BTL) OPTIME
TOPICAL | Status: DC | PRN
  Administered 2020-10-10: 1000 mL

## 2020-10-10 MED ORDER — CYCLOBENZAPRINE HCL 10 MG PO TABS: 10.0000 mg | ORAL_TABLET | Freq: Three times a day (TID) | ORAL | 0 refills | Status: DC | PRN

## 2020-10-10 MED ORDER — PROMETHAZINE HCL 25 MG/ML IJ SOLN: 6.2500 mg | INTRAMUSCULAR | Status: DC | PRN

## 2020-10-10 MED ORDER — THROMBIN 5000 UNITS EX SOLR
OROMUCOSAL | Status: DC | PRN
  Administered 2020-10-10: 5 mL via TOPICAL

## 2020-10-10 MED ORDER — SUCCINYLCHOLINE CHLORIDE 20 MG/ML IJ SOLN
INTRAMUSCULAR | Status: DC | PRN
  Administered 2020-10-10: 80 mg via INTRAVENOUS

## 2020-10-10 MED ORDER — ONDANSETRON HCL 4 MG/2ML IJ SOLN: 4.0000 mg | Freq: Four times a day (QID) | INTRAMUSCULAR | Status: DC | PRN

## 2020-10-10 MED ORDER — PROPOFOL 10 MG/ML IV BOLUS
INTRAVENOUS | Status: DC | PRN
  Administered 2020-10-10: 150 mg via INTRAVENOUS

## 2020-10-10 MED ORDER — CEFAZOLIN SODIUM-DEXTROSE 2-4 GM/100ML-% IV SOLN
2.0000 g | Freq: Three times a day (TID) | INTRAVENOUS | Status: AC
  Administered 2020-10-10 – 2020-10-11 (×2): 2 g via INTRAVENOUS
  Filled 2020-10-10 (×2): qty 100

## 2020-10-10 MED ORDER — PHENYLEPHRINE 40 MCG/ML (10ML) SYRINGE FOR IV PUSH (FOR BLOOD PRESSURE SUPPORT)
PREFILLED_SYRINGE | INTRAVENOUS | Status: DC | PRN
  Administered 2020-10-10: 80 ug via INTRAVENOUS
  Administered 2020-10-10: 120 ug via INTRAVENOUS

## 2020-10-10 MED ORDER — LACTATED RINGERS IV SOLN: INTRAVENOUS | Status: DC | PRN

## 2020-10-10 MED ORDER — PHENOL 1.4 % MT LIQD: 1.0000 | OROMUCOSAL | Status: DC | PRN

## 2020-10-10 MED ORDER — OXYCODONE HCL 5 MG PO TABS: 5.0000 mg | ORAL_TABLET | Freq: Once | ORAL | Status: DC | PRN

## 2020-10-10 MED ORDER — SODIUM CHLORIDE 0.9% FLUSH: 3.0000 mL | INTRAVENOUS | Status: DC | PRN

## 2020-10-10 MED ORDER — LOSARTAN POTASSIUM 50 MG PO TABS
50.0000 mg | ORAL_TABLET | Freq: Every day | ORAL | Status: DC
Start: 2020-10-10 — End: 2020-10-11
  Administered 2020-10-10: 50 mg via ORAL
  Filled 2020-10-10: qty 1

## 2020-10-10 MED ORDER — ONDANSETRON HCL 4 MG PO TABS: 4.0000 mg | ORAL_TABLET | Freq: Four times a day (QID) | ORAL | Status: DC | PRN

## 2020-10-10 MED ORDER — CHLORHEXIDINE GLUCONATE 0.12 % MT SOLN
15.0000 mL | Freq: Once | OROMUCOSAL | Status: AC
  Administered 2020-10-10: 15 mL via OROMUCOSAL
  Filled 2020-10-10: qty 15

## 2020-10-10 MED ORDER — EPHEDRINE SULFATE-NACL 50-0.9 MG/10ML-% IV SOSY
PREFILLED_SYRINGE | INTRAVENOUS | Status: DC | PRN
  Administered 2020-10-10: 5 mg via INTRAVENOUS
  Administered 2020-10-10: 10 mg via INTRAVENOUS

## 2020-10-10 MED ORDER — MIDAZOLAM HCL 5 MG/5ML IJ SOLN
INTRAMUSCULAR | Status: DC | PRN
  Administered 2020-10-10 (×2): 1 mg via INTRAVENOUS

## 2020-10-10 MED ORDER — SODIUM CHLORIDE 0.9 % IV SOLN: 250.0000 mL | INTRAVENOUS | Status: DC

## 2020-10-10 MED ORDER — GABAPENTIN 100 MG PO CAPS
100.0000 mg | ORAL_CAPSULE | Freq: Two times a day (BID) | ORAL | Status: DC
  Administered 2020-10-10: 100 mg via ORAL
  Filled 2020-10-10: qty 1

## 2020-10-10 MED ORDER — ACETAMINOPHEN 650 MG RE SUPP
650.0000 mg | RECTAL | Status: DC | PRN
Start: 2020-10-10 — End: 2020-10-11

## 2020-10-10 MED ORDER — ACETAMINOPHEN 500 MG PO TABS
1000.0000 mg | ORAL_TABLET | Freq: Once | ORAL | Status: AC
  Administered 2020-10-10: 1000 mg via ORAL
  Filled 2020-10-10: qty 2

## 2020-10-10 MED ORDER — HYDROMORPHONE HCL 1 MG/ML IJ SOLN
1.0000 mg | INTRAMUSCULAR | Status: DC | PRN
Start: 2020-10-10 — End: 2020-10-11

## 2020-10-10 MED ORDER — FENTANYL CITRATE (PF) 100 MCG/2ML IJ SOLN
25.0000 ug | INTRAMUSCULAR | Status: DC | PRN
  Administered 2020-10-10 (×4): 25 ug via INTRAVENOUS

## 2020-10-10 MED ORDER — CARVEDILOL 3.125 MG PO TABS
3.1250 mg | ORAL_TABLET | Freq: Two times a day (BID) | ORAL | Status: DC
  Administered 2020-10-10: 3.125 mg via ORAL
  Filled 2020-10-10: qty 1

## 2020-10-10 MED ORDER — FENTANYL CITRATE (PF) 250 MCG/5ML IJ SOLN
INTRAMUSCULAR | Status: DC | PRN
  Administered 2020-10-10: 50 ug via INTRAVENOUS
  Administered 2020-10-10: 100 ug via INTRAVENOUS
  Administered 2020-10-10 (×2): 50 ug via INTRAVENOUS

## 2020-10-10 MED ORDER — CEFAZOLIN SODIUM-DEXTROSE 2-4 GM/100ML-% IV SOLN
2.0000 g | INTRAVENOUS | Status: AC
  Administered 2020-10-10 (×2): 2 g via INTRAVENOUS
  Filled 2020-10-10: qty 100

## 2020-10-10 MED ORDER — DOCUSATE SODIUM 100 MG PO CAPS
100.0000 mg | ORAL_CAPSULE | Freq: Two times a day (BID) | ORAL | Status: DC
  Administered 2020-10-10 (×2): 100 mg via ORAL
  Filled 2020-10-10 (×2): qty 1

## 2020-10-10 MED ORDER — DEXAMETHASONE SODIUM PHOSPHATE 10 MG/ML IJ SOLN
INTRAMUSCULAR | Status: DC | PRN
  Administered 2020-10-10: 10 mg via INTRAVENOUS

## 2020-10-10 MED ORDER — OXYCODONE HCL 5 MG/5ML PO SOLN: 5.0000 mg | Freq: Once | ORAL | Status: DC | PRN

## 2020-10-10 MED ORDER — LIDOCAINE 2% (20 MG/ML) 5 ML SYRINGE
INTRAMUSCULAR | Status: DC | PRN
  Administered 2020-10-10: 60 mg via INTRAVENOUS

## 2020-10-10 MED ORDER — ROCURONIUM BROMIDE 10 MG/ML (PF) SYRINGE
PREFILLED_SYRINGE | INTRAVENOUS | Status: DC | PRN
  Administered 2020-10-10: 30 mg via INTRAVENOUS
  Administered 2020-10-10: 20 mg via INTRAVENOUS
  Administered 2020-10-10: 50 mg via INTRAVENOUS
  Administered 2020-10-10 (×3): 20 mg via INTRAVENOUS

## 2020-10-10 MED ORDER — CYCLOBENZAPRINE HCL 10 MG PO TABS
10.0000 mg | ORAL_TABLET | Freq: Three times a day (TID) | ORAL | Status: DC | PRN
Start: 2020-10-10 — End: 2020-10-11
  Administered 2020-10-10 – 2020-10-11 (×2): 10 mg via ORAL
  Filled 2020-10-10 (×2): qty 1

## 2020-10-10 MED ORDER — ACETAMINOPHEN 325 MG PO TABS: 650.0000 mg | ORAL_TABLET | ORAL | Status: DC | PRN

## 2020-10-10 MED ORDER — OXYCODONE HCL 5 MG PO TABS
10.0000 mg | ORAL_TABLET | ORAL | Status: DC | PRN
  Administered 2020-10-10 – 2020-10-11 (×4): 10 mg via ORAL
  Filled 2020-10-10 (×4): qty 2

## 2020-10-10 MED ORDER — FENTANYL CITRATE (PF) 250 MCG/5ML IJ SOLN
INTRAMUSCULAR | Status: AC
  Filled 2020-10-10: qty 5

## 2020-10-10 MED ORDER — LIDOCAINE-EPINEPHRINE 1 %-1:100000 IJ SOLN
INTRAMUSCULAR | Status: AC
  Filled 2020-10-10: qty 1

## 2020-10-10 MED ORDER — SUGAMMADEX SODIUM 200 MG/2ML IV SOLN
INTRAVENOUS | Status: DC | PRN
  Administered 2020-10-10: 200 mg via INTRAVENOUS

## 2020-10-10 MED ORDER — MIDAZOLAM HCL 2 MG/2ML IJ SOLN
INTRAMUSCULAR | Status: AC
  Filled 2020-10-10: qty 2

## 2020-10-10 MED ORDER — ORAL CARE MOUTH RINSE: 15.0000 mL | Freq: Once | OROMUCOSAL | Status: AC

## 2020-10-10 MED ORDER — OXYCODONE HCL 5 MG PO TABS: 5.0000 mg | ORAL_TABLET | ORAL | Status: DC | PRN

## 2020-10-10 MED ORDER — LIDOCAINE-EPINEPHRINE 1 %-1:100000 IJ SOLN
INTRAMUSCULAR | Status: DC | PRN
  Administered 2020-10-10: 10 mL

## 2020-10-10 MED ORDER — ATORVASTATIN CALCIUM 40 MG PO TABS
40.0000 mg | ORAL_TABLET | Freq: Every day | ORAL | Status: DC
  Administered 2020-10-10: 40 mg via ORAL
  Filled 2020-10-10: qty 1

## 2020-10-10 MED ORDER — ASPIRIN 81 MG PO TABS: 81.0000 mg | ORAL_TABLET | Freq: Every day | ORAL | Status: DC

## 2020-10-10 MED ORDER — FENTANYL CITRATE (PF) 100 MCG/2ML IJ SOLN
INTRAMUSCULAR | Status: AC
  Filled 2020-10-10: qty 2

## 2020-10-10 SURGICAL SUPPLY — 67 items
BAND RUBBER #18 3X1/16 STRL (MISCELLANEOUS) ×4 IMPLANT
BASKET BONE COLLECTION (BASKET) ×2 IMPLANT
BLADE CLIPPER SURG (BLADE) IMPLANT
BLADE SURG 11 STRL SS (BLADE) ×2 IMPLANT
BUR MATCHSTICK NEURO 3.0 LAGG (BURR) IMPLANT
BUR PRECISION FLUTE 5.0 (BURR) IMPLANT
BUR PRECISION MATCH 3.0 13 (BURR) ×2 IMPLANT
CNTNR URN SCR LID CUP LEK RST (MISCELLANEOUS) ×1 IMPLANT
CONT SPEC 4OZ STRL OR WHT (MISCELLANEOUS) ×1
COVER BACK TABLE 60X90IN (DRAPES) ×2 IMPLANT
COVER WAND RF STERILE (DRAPES) IMPLANT
DECANTER SPIKE VIAL GLASS SM (MISCELLANEOUS) ×2 IMPLANT
DERMABOND ADVANCED (GAUZE/BANDAGES/DRESSINGS) ×1
DERMABOND ADVANCED .7 DNX12 (GAUZE/BANDAGES/DRESSINGS) ×1 IMPLANT
DEVICE INTERBODY ELEVATE 23X7 (Cage) ×2 IMPLANT
DRAPE C-ARM 42X72 X-RAY (DRAPES) ×2 IMPLANT
DRAPE C-ARMOR (DRAPES) ×2 IMPLANT
DRAPE LAPAROTOMY 100X72X124 (DRAPES) ×2 IMPLANT
DRAPE MICROSCOPE LEICA (MISCELLANEOUS) ×2 IMPLANT
DRAPE SURG 17X23 STRL (DRAPES) ×4 IMPLANT
ELECT BLADE 6.5 EXT (BLADE) ×2 IMPLANT
ELECT REM PT RETURN 9FT ADLT (ELECTROSURGICAL) ×2
ELECTRODE REM PT RTRN 9FT ADLT (ELECTROSURGICAL) ×1 IMPLANT
EXTENDER TAB GUIDE SV 5.5/6.0 (INSTRUMENTS) ×16 IMPLANT
GAUZE 4X4 16PLY RFD (DISPOSABLE) IMPLANT
GAUZE SPONGE 4X4 12PLY STRL (GAUZE/BANDAGES/DRESSINGS) IMPLANT
GLOVE BIO SURGEON STRL SZ8 (GLOVE) ×2 IMPLANT
GLOVE BIO SURGEON STRL SZ8.5 (GLOVE) ×2 IMPLANT
GLOVE BIOGEL PI IND STRL 7.5 (GLOVE) IMPLANT
GLOVE BIOGEL PI INDICATOR 7.5 (GLOVE)
GLOVE ECLIPSE 7.5 STRL STRAW (GLOVE) IMPLANT
GLOVE SURG LTX SZ7.5 (GLOVE) ×2 IMPLANT
GLOVE SURG POLYISO LF SZ6 (GLOVE) ×10 IMPLANT
GLOVE SURG UNDER POLY LF SZ6.5 (GLOVE) ×2 IMPLANT
GLOVE SURG UNDER POLY LF SZ7 (GLOVE) ×4 IMPLANT
GLOVE SURG UNDER POLY LF SZ7.5 (GLOVE) ×2 IMPLANT
GOWN STRL REUS W/ TWL LRG LVL3 (GOWN DISPOSABLE) ×2 IMPLANT
GOWN STRL REUS W/ TWL XL LVL3 (GOWN DISPOSABLE) ×1 IMPLANT
GOWN STRL REUS W/TWL 2XL LVL3 (GOWN DISPOSABLE) IMPLANT
GOWN STRL REUS W/TWL LRG LVL3 (GOWN DISPOSABLE) ×2
GOWN STRL REUS W/TWL XL LVL3 (GOWN DISPOSABLE) ×1
GUIDEWIRE BLUNT NT 450 (WIRE) ×8 IMPLANT
HEMOSTAT POWDER KIT SURGIFOAM (HEMOSTASIS) ×2 IMPLANT
KIT BASIN OR (CUSTOM PROCEDURE TRAY) ×2 IMPLANT
KIT POSITION SURG JACKSON T1 (MISCELLANEOUS) ×2 IMPLANT
KIT TURNOVER KIT B (KITS) IMPLANT
NEEDLE BEVEL TWO-PAK W/1PK (NEEDLE) ×2 IMPLANT
NEEDLE HYPO 18GX1.5 BLUNT FILL (NEEDLE) IMPLANT
NEEDLE HYPO 22GX1.5 SAFETY (NEEDLE) ×2 IMPLANT
NEEDLE SPNL 18GX3.5 QUINCKE PK (NEEDLE) ×2 IMPLANT
NS IRRIG 1000ML POUR BTL (IV SOLUTION) ×2 IMPLANT
PACK LAMINECTOMY NEURO (CUSTOM PROCEDURE TRAY) ×2 IMPLANT
PAD ARMBOARD 7.5X6 YLW CONV (MISCELLANEOUS) ×10 IMPLANT
ROD PERC CCM 5.5X35 (Rod) ×4 IMPLANT
SCREW MAS VOYAGER 6.5X35 (Screw) ×4 IMPLANT
SCREW MAS VOYAGER 6.5X40 (Screw) ×4 IMPLANT
SCREW SET 5.5/6.0MM SOLERA (Screw) ×8 IMPLANT
SPONGE LAP 4X18 RFD (DISPOSABLE) IMPLANT
SUT MNCRL AB 3-0 PS2 18 (SUTURE) ×2 IMPLANT
SUT VIC AB 0 CT1 18XCR BRD8 (SUTURE) IMPLANT
SUT VIC AB 0 CT1 8-18 (SUTURE)
SUT VIC AB 2-0 CP2 18 (SUTURE) ×2 IMPLANT
SYR 3ML LL SCALE MARK (SYRINGE) IMPLANT
TOWEL GREEN STERILE (TOWEL DISPOSABLE) ×2 IMPLANT
TOWEL GREEN STERILE FF (TOWEL DISPOSABLE) ×2 IMPLANT
TRAY FOLEY MTR SLVR 16FR STAT (SET/KITS/TRAYS/PACK) ×2 IMPLANT
WATER STERILE IRR 1000ML POUR (IV SOLUTION) ×2 IMPLANT

## 2020-10-10 NOTE — Discharge Instructions (Addendum)
Discharge Instructions ° °No restriction in activities, slowly increase your activity back to normal.  ° °Your incision is closed with dermabond (purple glue). This will naturally fall off over the next 1-2 weeks.  ° °Okay to shower on the day of discharge. Use regular soap and water and try to be gentle when cleaning your incision.  ° °Follow up with Dr. Ostergard in 2 weeks after discharge. If you do not already have a discharge appointment, please call his office at [phone] to schedule a follow up appointment. If you have any concerns or questions, please call the office and let us know. °

## 2020-10-10 NOTE — Transfer of Care (Signed)
Immediate Anesthesia Transfer of Care Note  Patient: Navid Lenzen [facility]  Procedure(s) Performed: Right Lumbar Five Sacral One Minimally invasive transforaminal lumbar interbody fusion (Left Spine Lumbar)  Patient Location: PACU  Anesthesia Type:General  Level of Consciousness: awake, alert , oriented and drowsy  Airway & Oxygen Therapy: Patient Spontanous Breathing and Patient connected to nasal cannula oxygen  Post-op Assessment: Report given to RN, Post -op Vital signs reviewed and stable and Patient moving all extremities X 4  Post vital signs: Reviewed and stable  Last Vitals:  Vitals Value Taken Time  BP 99/69 10/10/20 1300  Temp    Pulse 80 10/10/20 1301  Resp 25 10/10/20 1301  SpO2 97 % 10/10/20 1301  Vitals shown include unvalidated device data.  Last Pain:  Vitals:   10/10/20 0611  TempSrc:   PainSc: 7       Patients Stated Pain Goal: 2 (79/98/72 1587)  Complications: No complications documented.

## 2020-10-10 NOTE — Brief Op Note (Signed)
10/10/2020  12:49 PM  PATIENT:  Richard Mueller  65 y.o. male  PRE-OPERATIVE DIAGNOSIS:  Lumbar spondylolisthesis and radiculopathy  POST-OPERATIVE DIAGNOSIS:  Same  PROCEDURE:  Procedure(s) with comments: Right Lumbar Five Sacral One Minimally invasive transforaminal lumbar interbody fusion (Left) - posterior  SURGEON:  Surgeon(s) and Role:    * Judith Part, MD - Primary    * Newman Pies, MD - Assisting  PHYSICIAN ASSISTANT:   ANESTHESIA:   general  EBL:  250 mL   BLOOD ADMINISTERED:none  DRAINS: none   LOCAL MEDICATIONS USED:  LIDOCAINE   SPECIMEN:  No Specimen  DISPOSITION OF SPECIMEN:  N/A  COUNTS:  YES  TOURNIQUET:  * No tourniquets in log *  DICTATION: .Note written in EPIC  PLAN OF CARE: Admit for overnight observation  PATIENT DISPOSITION:  PACU - hemodynamically stable.   Delay start of Pharmacological VTE agent (>24hrs) due to surgical blood loss or risk of bleeding: yes

## 2020-10-10 NOTE — H&P (Signed)
Surgical H&P Update  HPI: 65 y.o. man with h/o LBP and radicular symptoms worse on the right than left due to an isthmic spondylolisthesis. His symptoms were controlled for a long time with activity modification, medications, and ESIs, but unfortunately became refractory. No changes in health since he was last seen. Still having pain in the back, R>L leg and wishes to proceed with surgery.  PMHx:  Past Medical History:  Diagnosis Date  . Allergic rhinitis, cause unspecified   . Allergy   . Bronchitis   . Cancer ([place]) 2020   skin cancer  . Esophageal reflux   . GERD (gastroesophageal reflux disease)   . Hyperlipidemia   . Hypertension   . Metabolic syndrome   . Obesity, unspecified   . Osteoarthritis   . Osteoarthrosis, unspecified whether generalized or localized, unspecified site   . Pneumonia   . Right upper quadrant pain    FamHx:  Family History  Problem Relation Age of Onset  . Heart disease Mother   . Heart attack Mother   . COPD Mother   . Heart disease Father   . Hypertension Father   . COPD Father   . Cancer Father         Prostate and Skin  . COPD Sister   . Suicidality Brother   . Hypertension Daughter   . Heart attack Brother 54       2014  . Depression Daughter    SocHx:  reports that he quit smoking about 18 years ago. His smoking use included cigarettes. He started smoking about 33 years ago. He has a 15.00 pack-year smoking history. He has never used smokeless tobacco. He reports current alcohol use. He reports that he does not use drugs.  Physical Exam: AOx3, PERRL, FS, TM  Strength 5/5 x4, SILTx4  Assesment/Plan: 65 y.o. man with L5-S1 isthmic spondy with foraminal stenosis, here for right L5-S1 MIS TLIF. Risks, benefits, and alternatives discussed and the patient would like to continue with surgery.  -OR today -3C post-op  Judith Part, MD 10/10/20 7:41 AM

## 2020-10-10 NOTE — Progress Notes (Signed)
Neurosurgery Service Post-operative progress note  Assessment & Plan: 65 y.o. man s/p L5-S1 MIS TLIF, seen in PACU, MAEx4, recovering well.  -admit to 3C overnight -ADAT -activity as tolerated, no brace needed  Judith Part  10/10/20 1:26 PM

## 2020-10-10 NOTE — Discharge Summary (Incomplete)
Discharge Summary  Date of Admission: 10/10/2020  Date of Discharge: 10/11/2020  Attending Physician: Emelda Brothers, MD  Hospital Course: Patient was admitted following an uncomplicated right S4-N9 MIS TLIF. He was recovered in PACU and transferred to [facility]. His preoperative symptoms were resolved immediately post-op with no neurologic symptoms. His hospital course was uncomplicated and the patient was discharged home on 2/19. He will follow up in clinic with me in 2 weeks.  Neurologic exam at discharge:  AOx3, PERRL, EOMI, FS, TM Strength 5/5 x4, SILTx4  Discharge diagnosis: Lumbar isthmic spondylolisthesis, lumbar radiculopathy  Richard Part, MD 10/10/20 4:47 PM

## 2020-10-10 NOTE — Anesthesia Procedure Notes (Signed)
Procedure Name: Intubation Date/Time: 10/10/2020 7:55 AM Performed by: Gaylene Brooks, CRNA Pre-anesthesia Checklist: Patient identified, Emergency Drugs available, Suction available and Patient being monitored Patient Re-evaluated:Patient Re-evaluated prior to induction Oxygen Delivery Method: Circle System Utilized Preoxygenation: Pre-oxygenation with 100% oxygen Induction Type: IV induction Laryngoscope Size: Miller and 2 Grade View: Grade I Tube type: Oral Tube size: 7.5 mm Number of attempts: 1 Airway Equipment and Method: Stylet and Oral airway Placement Confirmation: ETT inserted through vocal cords under direct vision,  positive ETCO2 and breath sounds checked- equal and bilateral Tube secured with: Tape Dental Injury: Teeth and Oropharynx as per pre-operative assessment  Comments: Succinylcholine used due to full beard. Easy atraumatic intubation.

## 2020-10-10 NOTE — Op Note (Signed)
PATIENT: Richard Mueller [facility]  DAY OF SURGERY: 10/10/20   PRE-OPERATIVE DIAGNOSIS:  Lumbar radiculopathy, lumbar isthmic spondylolisthesis   POST-OPERATIVE DIAGNOSIS:  Same   PROCEDURE:  Right L5-S1 minimally invasive transforaminal lumbar interbody fusion with bilateral L5-S1 pedicle screw placement with laminectomy and bilateral foraminotomies   SURGEON:  Surgeon(s) and Role:    Judith Part, MD - Primary    Newman Pies, MD - Assisting   ANESTHESIA: ETGA   BRIEF HISTORY: This is a 65 year old man who presented with low back pain and predominantly right sided radicular pain. The patient was found to have an isthmic spondylolisthesis at L5-S1 with bilateral foraminal stenosis. After some time with good success at managing his symptoms, they became refractory to non-surgical treatment measures. I therefore recommended an L5-S1 TLIF with bilateral foraminotomies and bilateral pedicle screw placement. This was discussed with the patient as well as risks, benefits, and alternatives and wished to proceed with surgery.   OPERATIVE DETAIL:  The patient was taken to the operating room and placed on the OR table in the prone position. A formal time out was performed with two patient identifiers and confirmed the operative site. Anesthesia was induced by the anesthesia team. The operative site was marked, hair was clipped with surgical clippers, the area was then prepped and draped in a sterile fashion.   Fluoroscopy was used to localize the surgical level. The pedicles were marked and used to create skin incisions bilaterally. With fluoro guidance, Jamshidi needles were used to guide K-wires into the bilateral L5 and S1 pedicles. The K wires were then secured with hemostats and attention turned to the TLIF.  A [employer] tube was then docked to the right L5-S1 facet through the same incision using fluoroscopy. A right L5-S1 facetectomy was performed along with a medial laminotomy to identify  the thecal sac. The right L5 nerve root was decompressed along its entire course. The tube was wanded medially and the decompression was continued medially until reaching the contralateral foramen. This decompression was more than what was needed for the TLIF alone and fluoroscopy was used to make sure I got out to the lateral extent of the foramen using curved kerrison rongeurs. With the foraminotomy completed, the tube was wanded back to the disc space. The disc space was identified, incised, and a discectomy was performed in the standard fashion. The endplates were prepped, bone graft was packed into the disc space, and an expandable cage ([employer]) was packed with autograft and placed into the disc space with fluoroscopic confirmation. The tube was removed and hemostasis was obtained during its removal.   Using the previously placed K wires, a tap and then screw with tower were placed bilaterally at L5 and S1. A rod was sized and introduced on both sides, confirmed with fluoroscopy, then final tightened. Hemostasis was again confirmed for both incisions, they were copiously irrigated, and then closed in layers.    EBL:  119mL   DRAINS: none   SPECIMENS: none   Judith Part, MD 10/10/20 12:50 PM

## 2020-10-11 NOTE — Evaluation (Signed)
Physical Therapy Evaluation and DISCHARGE Patient Details Name: Richard Mueller MRN: [id number] DOB: 1956-01-29 Today's Date: 10/11/2020   History of Present Illness  65 yo male s/p L5-S1 TLIF. PMH including Allergic rhinitis, Allergy, Bronchitis, Cancer (2020), Esophageal reflux, GERD, Hyperlipidemia, HTN, Osteoarthritis, Osteoarthrosis, Pneumonia, and Right upper quadrant pain.    Clinical Impression  Pt admitted for above. Pt functioning at mod I/supervision level with AD. Pt and spouse with good understanding of back precautions. Pt able to safely navigate stairs to enter home. Pt with no further acute skilled PT needs at this time. Pt d/c'd from PT services.    Follow Up Recommendations No PT follow up    Equipment Recommendations  None recommended by PT    Recommendations for Other Services       Precautions / Restrictions Precautions Precautions: Back Precaution Booklet Issued: Yes (comment) Precaution Comments: pt able to recall 3/3 precautions, verbal cues to adhere functionally Required Braces or Orthoses: Other Brace Other Brace: No brace per order Restrictions Weight Bearing Restrictions: No      Mobility  Bed Mobility Overal bed mobility: Needs Assistance Bed Mobility: Rolling;Sidelying to Sit;Sit to Sidelying Rolling: Supervision Sidelying to sit: Supervision     Sit to sidelying: Supervision General bed mobility comments: pt up walking upon PT return, pt able to educate PT on how to log roll in/out of bed    Transfers Overall transfer level: Needs assistance   Transfers: Sit to/from Stand Sit to Stand: Supervision         General transfer comment: increased time, guarded  Ambulation/Gait Ambulation/Gait assistance: Modified independent (Device/Increase time) Gait Distance (Feet): 300 Feet Assistive device: None Gait Pattern/deviations: Step-through pattern;Decreased stride length Gait velocity: dec but appropriate for just having  surgery   General Gait Details: slow, guarded, "stiff back", verbal cues to relax shoulders, mild trunk flexion due to low back pain at incision and guarding  Stairs Stairs: Yes Stairs assistance: Min guard Stair Management: One rail Right;Alternating pattern;Forwards Number of Stairs: 4 General stair comments: pt with no LOB, verbal cues to contract abdominal muscles to support back and use more LEs insteady of pulling on railing  Wheelchair Mobility    Modified Rankin (Stroke Patients Only)       Balance Overall balance assessment: Modified Independent                                           Pertinent Vitals/Pain Pain Assessment: 0-10 Pain Score: 6  Faces Pain Scale: Hurts little more Pain Location: Back Pain Descriptors / Indicators: Discomfort;Grimacing Pain Intervention(s): Monitored during session    Home Living Family/patient expects to be discharged to:: Private residence Living Arrangements: Spouse/significant other Available Help at Discharge: Family;Available 24 hours/day Type of Home: House Home Access: Stairs to enter Entrance Stairs-Rails: Left;Can reach Advertising account executive of Steps: 4 Home Layout: One level Home Equipment: None      Prior Function Level of Independence: Independent         Comments: Works at [employer]        Extremity/Trunk Assessment   Upper Extremity Assessment Upper Extremity Assessment: Overall WFL for tasks assessed    Lower Extremity Assessment Lower Extremity Assessment: Overall WFL for tasks assessed    Cervical / Trunk Assessment Cervical / Trunk Assessment: Other exceptions Cervical / Trunk Exceptions: s/p back sx  Communication   Communication: No difficulties  Cognition Arousal/Alertness: Awake/alert Behavior During Therapy: WFL for tasks assessed/performed Overall Cognitive Status: Within Functional Limits for tasks assessed                                         General Comments General comments (skin integrity, edema, etc.): VSS, wife present t/o    Exercises Other Exercises Other Exercises: discussed isometric abd contractions in standing, sitting, and supine in hooklying to promote trunk stability   Assessment/Plan    PT Assessment Patent does not need any further PT services  PT Problem List         PT Treatment Interventions      PT Goals (Current goals can be found in the Care Plan section)  Acute Rehab PT Goals Patient Stated Goal: Go home PT Goal Formulation: All assessment and education complete, DC therapy    Frequency     Barriers to discharge        Co-evaluation               AM-PAC PT "6 Clicks" Mobility  Outcome Measure Help needed turning from your back to your side while in a flat bed without using bedrails?: None Help needed moving from lying on your back to sitting on the side of a flat bed without using bedrails?: None Help needed moving to and from a bed to a chair (including a wheelchair)?: None Help needed standing up from a chair using your arms (e.g., wheelchair or bedside chair)?: None Help needed to walk in hospital room?: None Help needed climbing 3-5 steps with a railing? : A Little 6 Click Score: 23    End of Session   Activity Tolerance: Patient tolerated treatment well Patient left: with family/visitor present (in the bathroom) Nurse Communication: Mobility status PT Visit Diagnosis: Difficulty in walking, not elsewhere classified (R26.2)    Time: 2060-1561 PT Time Calculation (min) (ACUTE ONLY): 17 min   Charges:   PT Evaluation $PT Eval Low Complexity: 1 Low          Kittie Plater, PT, DPT Acute Rehabilitation Services Pager #: [phone] Office #: [phone]   Berline Lopes 10/11/2020, 11:21 AM

## 2020-10-11 NOTE — Evaluation (Signed)
Occupational Therapy Evaluation Patient Details Name: Richard Mueller MRN: [id number] DOB: October 21, 1955 Today's Date: 10/11/2020    History of Present Illness 65 yo male s/p L5-S1 TLIF. PMH including Allergic rhinitis, Allergy, Bronchitis, Cancer (2020), Esophageal reflux, GERD, Hyperlipidemia, HTN, Osteoarthritis, Osteoarthrosis, Pneumonia, and Right upper quadrant pain.    Clinical Impression   PTA, pt was living with his wife and was independent. Currently, pt performing ADLs and functional mobility at Mod I level with increased time as needed. Provided education and handout on back precautions, bed mobility, grooming, UB ADLs, LB ADLs, toileting, and tub transfer; pt demonstrated understanding. Answered all pt questions. Recommend dc home once medically stable per physician. All acute OT needs met and will sign off. Thank you.    Follow Up Recommendations  No OT follow up    Equipment Recommendations  None recommended by OT    Recommendations for Other Services PT consult     Precautions / Restrictions Precautions Precautions: Back Precaution Booklet Issued: Yes (comment) Precaution Comments: Reviewed all back precautions and compensatory techniques for ADLs Required Braces or Orthoses: Other Brace Other Brace: No brace per order Restrictions Weight Bearing Restrictions: No      Mobility Bed Mobility Overal bed mobility: Needs Assistance Bed Mobility: Rolling;Sidelying to Sit;Sit to Sidelying Rolling: Supervision Sidelying to sit: Supervision     Sit to sidelying: Supervision      Transfers Overall transfer level: Needs assistance   Transfers: Sit to/from Stand Sit to Stand: Supervision              Balance Overall balance assessment: Modified Independent                                         ADL either performed or assessed with clinical judgement   ADL Overall ADL's : Modified independent                                        General ADL Comments: Pt performing at Mod I level with increased time as needed. Providing pt with education on back precautions, bed mobility, grooming, UB ADLs, LB ADLs, toileting, and tub transfer.     Vision Baseline Vision/History: Wears glasses Wears Glasses: At all times Patient Visual Report: No change from baseline       Perception     Praxis      Pertinent Vitals/Pain Pain Assessment: Faces Faces Pain Scale: Hurts little more Pain Location: Back Pain Descriptors / Indicators: Discomfort;Grimacing Pain Intervention(s): Monitored during session;Repositioned     Hand Dominance     Extremity/Trunk Assessment Upper Extremity Assessment Upper Extremity Assessment: Overall WFL for tasks assessed   Lower Extremity Assessment Lower Extremity Assessment: Overall WFL for tasks assessed   Cervical / Trunk Assessment Cervical / Trunk Assessment: Other exceptions Cervical / Trunk Exceptions: s/p back sx   Communication Communication Communication: No difficulties   Cognition Arousal/Alertness: Awake/alert Behavior During Therapy: WFL for tasks assessed/performed Overall Cognitive Status: Within Functional Limits for tasks assessed                                     General Comments  Wife present throughout    Exercises     Shoulder Instructions  Home Living Family/patient expects to be discharged to:: Private residence Living Arrangements: Spouse/significant other Available Help at Discharge: Family;Available 24 hours/day Type of Home: House Home Access: Stairs to enter [employer] of Steps: 4 Entrance Stairs-Rails: Left;Can reach both;Right Home Layout: One level     Bathroom Shower/Tub: Corporate investment banker: Standard     Home Equipment: None          Prior Functioning/Environment Level of Independence: Independent        Comments: Works at [employer]  List: Impaired balance (sitting and/or standing);Decreased knowledge of precautions;Decreased knowledge of use of DME or AE;Pain      OT Treatment/Interventions:      OT Goals(Current goals can be found in the care plan section) Acute Rehab OT Goals Patient Stated Goal: Go home OT Goal Formulation: All assessment and education complete, DC therapy  OT Frequency:     Barriers to D/C:            Co-evaluation              AM-PAC OT "6 Clicks" Daily Activity     Outcome Measure Help from another person eating meals?: None Help from another person taking care of personal grooming?: None Help from another person toileting, which includes using toliet, bedpan, or urinal?: None Help from another person bathing (including washing, rinsing, drying)?: None Help from another person to put on and taking off regular upper body clothing?: None Help from another person to put on and taking off regular lower body clothing?: None 6 Click Score: 24   End of Session Nurse Communication: Mobility status  Activity Tolerance: Patient tolerated treatment well Patient left: in chair;with call bell/phone within reach;with family/visitor present  OT Visit Diagnosis: Unsteadiness on feet (R26.81);Other abnormalities of gait and mobility (R26.89);Muscle weakness (generalized) (M62.81)                Time: 6720-9470 OT Time Calculation (min): 28 min Charges:  OT General Charges $OT Visit: 1 Visit OT Evaluation $OT Eval Low Complexity: 1 Low OT Treatments $Self Care/Home Management : 8-22 mins  Charis Capehart MSOT, OTR/L Acute Rehab Pager: [phone] Office: [place] 10/11/2020, 9:47 AM

## 2020-10-11 NOTE — Discharge Summary (Signed)
Physician Discharge Summary     Providing Compassionate, Quality Care - Together   Patient ID: Richard Mueller MRN: [id number] DOB/AGE: [birth year] 65 y.o.  Admit date: 10/10/2020 Discharge date: 10/11/2020  Admission Diagnoses: Spondylolisthesis of lumbar region  Discharge Diagnoses:  Active Problems:   Spondylolisthesis of lumbar region   Discharged Condition: good  Hospital Course: Patient was admitted following an uncomplicated right P5-K9 MIS TLIF. He was recovered in PACU and transferred to [facility]. His preoperative symptoms were resolved immediately post-op with no neurologic symptoms. His hospital course was uncomplicated and the patient was discharged home on 2/19. He will follow up in clinic with Dr. Zada Finders in 2 weeks.  Consults: None  Significant Diagnostic Studies: radiology: DG Lumbar Spine 2-3 Views  Result Date: 10/10/2020 CLINICAL DATA:  Surgery, elective. Additional history provided: Right lumbar 5 sacral 1 minimally invasive transforaminal lumbar interbody fusion for pars defect of lumbar spine. Provided fluoroscopy time 2 minutes, 43 seconds (121.12 mGy). EXAM: LUMBAR SPINE - 2-3 VIEW; DG C-ARM 1-60 MIN COMPARISON:  Lumbar spine MRI 07/02/2020. FINDINGS: PA and lateral view intraoperative fluoroscopic images of the lumbar spine are submitted, 3 images total. On the final images, there are bilateral pedicle screws at the L5 and S1 levels with vertical interconnecting rods. Surgical instruments project posteriorly at this level. Additionally, an interbody device is now present at L5-S1. IMPRESSION: Three intraoperative fluoroscopic images from L5-S1 fusion, as described. Electronically Signed   By: Kellie Simmering DO   On: 10/10/2020 12:58   DG C-Arm 1-60 Min  Result Date: 10/10/2020 CLINICAL DATA:  Surgery, elective. Additional history provided: Right lumbar 5 sacral 1 minimally invasive transforaminal lumbar interbody fusion for pars defect of lumbar spine.  Provided fluoroscopy time 2 minutes, 43 seconds (121.12 mGy). EXAM: LUMBAR SPINE - 2-3 VIEW; DG C-ARM 1-60 MIN COMPARISON:  Lumbar spine MRI 07/02/2020. FINDINGS: PA and lateral view intraoperative fluoroscopic images of the lumbar spine are submitted, 3 images total. On the final images, there are bilateral pedicle screws at the L5 and S1 levels with vertical interconnecting rods. Surgical instruments project posteriorly at this level. Additionally, an interbody device is now present at L5-S1. IMPRESSION: Three intraoperative fluoroscopic images from L5-S1 fusion, as described. Electronically Signed   By: Kellie Simmering DO   On: 10/10/2020 12:58     Treatments: surgery: Right L5-S1 minimally invasive transforaminal lumbar interbody fusion with bilateral L5-S1 pedicle screw placement with laminectomy and bilateral foraminotomies  Discharge Exam: Blood pressure 94/70, pulse 72, temperature 98.3 F (36.8 C), temperature source Oral, resp. rate 18, height 5\' 9"  (1.753 m), weight 93.4 kg, SpO2 100 %.  AOx3, PERRL, EOMI, FS, TM Strength 5/5 x4, SILTx4  Disposition: Discharge disposition: 01-Home or Self Care        Allergies as of 10/11/2020      Reactions   Lisinopril Rash   pins and needles in arms      Medication List    TAKE these medications   aspirin 81 MG tablet Take 1 tablet (81 mg total) by mouth daily. Okay to restart on 10/17/20 What changed: additional instructions   atorvastatin 40 MG tablet Commonly known as: LIPITOR Take 1 tablet (40 mg total) by mouth daily.   b complex vitamins tablet Take 1 tablet by mouth daily.   calcium carbonate 1250 (500 Ca) MG tablet Commonly known as: OS-CAL - dosed in mg of elemental calcium Take 1 tablet by mouth daily.   carvedilol 3.125 MG tablet Commonly known as:  COREG Take 1 tablet (3.125 mg total) by mouth 2 (two) times daily.   cyclobenzaprine 10 MG tablet Commonly known as: FLEXERIL Take 1 tablet (10 mg total) by mouth 3  (three) times daily as needed for muscle spasms.   Fish Oil 1000 MG Caps Take 1,000 mg by mouth daily.   gabapentin 100 MG capsule Commonly known as: NEURONTIN Take 100 mg by mouth 2 (two) times daily.   GINKGO BILOBA PO Take 1 tablet by mouth daily.   losartan 50 MG tablet Commonly known as: COZAAR TAKE 1 TABLET BY MOUTH DAILY.   MAGNESIUM PO Take 1 tablet by mouth every other day.   multivitamin tablet Take 1 tablet by mouth daily.   naproxen 500 MG tablet Commonly known as: NAPROSYN Take 500 mg by mouth 2 (two) times daily as needed for moderate pain.   oxyCODONE 5 MG immediate release tablet Commonly known as: Oxy IR/ROXICODONE Take 1 tablet (5 mg total) by mouth every 4 (four) hours as needed for moderate pain ((score 4 to 6)).   VITAMIN C PO Take 1 tablet by mouth daily.   Vitamin D 50 MCG (2000 UT) tablet Take 2,000 Units by mouth daily.       Follow-up Information    Judith Part, MD. Schedule an appointment as soon as possible for a visit in 2 week(s).   Specialty: Neurosurgery Contact information: [place] [address] [phone]               Signed: Patricia Nettle 10/11/2020, 9:05 AM

## 2020-10-11 NOTE — Progress Notes (Signed)
Patient id discharged from room 3C05 at this time. Alert and in stable condition. IV site d/c'd and instructions read to patient and spouse with understanding verbalized and all questions answered. Left unit via wheelchair with all belongings at side.

## 2020-10-11 NOTE — Anesthesia Postprocedure Evaluation (Signed)
Anesthesia Post Note  Patient: Richard Mueller [facility]  Procedure(s) Performed: Right Lumbar Five Sacral One Minimally invasive transforaminal lumbar interbody fusion (Left Spine Lumbar)     Patient location during evaluation: PACU Anesthesia Type: General Level of consciousness: awake Pain management: pain level controlled Vital Signs Assessment: post-procedure vital signs reviewed and stable Respiratory status: spontaneous breathing, nonlabored ventilation, respiratory function stable and patient connected to nasal cannula oxygen Cardiovascular status: blood pressure returned to baseline and stable Postop Assessment: no apparent nausea or vomiting Anesthetic complications: no   No complications documented.  Last Vitals:  Vitals:   10/11/20 0411 10/11/20 0511  BP: (!) 104/54 105/64  Pulse: 72 71  Resp: 18   Temp: 37 C   SpO2: 97%     Last Pain:  Vitals:   10/11/20 0441  TempSrc:   PainSc: 3                  Albina Gosney P Emili Mcloughlin

## 2020-10-13 ENCOUNTER — Encounter (HOSPITAL_COMMUNITY): Payer: Self-pay | Admitting: Neurological Surgery

## 2020-11-04 ENCOUNTER — Encounter: Payer: Self-pay | Admitting: Cardiovascular Disease

## 2020-11-04 ENCOUNTER — Ambulatory Visit (INDEPENDENT_AMBULATORY_CARE_PROVIDER_SITE_OTHER): Payer: BC Managed Care – PPO | Admitting: Cardiovascular Disease

## 2020-11-04 ENCOUNTER — Other Ambulatory Visit: Payer: Self-pay

## 2020-11-04 VITALS — BP 128/70 | HR 74 | Ht 69.0 in | Wt 213.0 lb

## 2020-11-04 DIAGNOSIS — I10 Essential (primary) hypertension: Secondary | ICD-10-CM | POA: Diagnosis not present

## 2020-11-04 DIAGNOSIS — I2510 Atherosclerotic heart disease of native coronary artery without angina pectoris: Secondary | ICD-10-CM

## 2020-11-04 DIAGNOSIS — E785 Hyperlipidemia, unspecified: Secondary | ICD-10-CM | POA: Diagnosis not present

## 2020-11-04 DIAGNOSIS — I700 Atherosclerosis of aorta: Secondary | ICD-10-CM | POA: Diagnosis not present

## 2020-11-04 NOTE — Progress Notes (Signed)
Cardiology Office Note   Date:  11/04/2020   ID:  Richard Mueller, DOB 10-Jul-1956, MRN [id number]  PCP:  Steele Sizer, MD  Cardiologist:   Kathlyn Sacramento, MD   Chief Complaint  Patient presents with   Other    6 month follow up - Patient c.o BP being elevated. (130s / 80s). Meds reviewed verbally with patient.       History of Present Illness: Richard Mueller is a 65 y.o. male who presents for a follow-up regarding moderate nonobstructive coronary artery disease and palpitations.  He does have a family history of CAD.  He is known to have aortic atherosclerosis on previous CT imaging. He was most recently seen in September with atypical chest pain and palpitations.  He underwent a 2-week ZIO monitor which showed 1 episode of wide-complex tachycardia lasting only 4 beats and was likely due to SVT with aberrancy although slow nonsustained ventricular tachycardia cannot be excluded.  He was also noted to have short runs of SVT the longest lasted 16 beats.  Echocardiogram was done in August which showed normal LV systolic function with mild mitral regurgitation and no significant pulmonary hypertension.  Lexiscan Myoview showed fixed apical and inferior defect.  The nuclear stress study was suboptimal and thus he underwent cardiac CTA in September which showed a calcium score of 857 with evidence of moderate stenosis in the proximal LAD as well as mild left circumflex disease.  None of the lesions were significant by FFR. He has been doing well overall.  He had back surgery done in February and he is gradually improving.  He reports no further chest pain.  No worsening of exertional dyspnea.   Past Medical History:  Diagnosis Date   Allergic rhinitis, cause unspecified    Allergy    Bronchitis    Cancer ([place]) 2020   skin cancer   Esophageal reflux    GERD (gastroesophageal reflux disease)    Hyperlipidemia    Hypertension    Metabolic syndrome     Obesity, unspecified    Osteoarthritis    Osteoarthrosis, unspecified whether generalized or localized, unspecified site    Pneumonia    Right upper quadrant pain     Past Surgical History:  Procedure Laterality Date   APPENDECTOMY  11/2018   COLONOSCOPY     COLONOSCOPY WITH PROPOFOL N/A 08/08/2020   Procedure: COLONOSCOPY WITH PROPOFOL;  Surgeon: [name], MD;  Location: [place];  Service: Endoscopy;  Laterality: N/A;  priority 4   LAPAROSCOPIC APPENDECTOMY N/A 11/28/2018   Procedure: APPENDECTOMY LAPAROSCOPIC;  Surgeon: Jules Husbands, MD;  Location: [facility];  Service: General;  Laterality: N/A;   ORTHOPEDIC SURGERY     POLYPECTOMY  08/08/2020   Procedure: POLYPECTOMY;  Surgeon: [name], MD;  Location: [place];  Service: Endoscopy;;   SKIN CANCER DESTRUCTION Left 03/06/2015   Hand- Dr. Phillip Heal   SKIN CANCER EXCISION Left 08/2018   Dr. Phillip Heal on Left Arm    TRANSFORAMINAL LUMBAR INTERBODY FUSION W/ MIS 1 LEVEL Left 10/10/2020   Procedure: Right Lumbar Five Sacral One Minimally invasive transforaminal lumbar interbody fusion;  Surgeon: Judith Part, MD;  Location: [place];  Service: Neurosurgery;  Laterality: Left;  posterior     Current Outpatient Medications  Medication Sig Dispense Refill   Ascorbic Acid (VITAMIN C PO) Take 1 tablet by mouth daily.     aspirin 81 MG tablet Take 1 tablet (81 mg total) by mouth  daily. Okay to restart on 10/17/20 30 tablet    atorvastatin (LIPITOR) 40 MG tablet Take 1 tablet (40 mg total) by mouth daily. 90 tablet 3   b complex vitamins tablet Take 1 tablet by mouth daily.     calcium carbonate (OS-CAL - DOSED IN MG OF ELEMENTAL CALCIUM) 1250 (500 Ca) MG tablet Take 1 tablet by mouth daily.     carvedilol (COREG) 3.125 MG tablet Take 1 tablet (3.125 mg total) by mouth 2 (two) times daily. 180 tablet 3   Cholecalciferol (VITAMIN D) 50 MCG (2000 UT) tablet Take 2,000 Units by mouth daily.      gabapentin (NEURONTIN) 100 MG capsule Take 100 mg by mouth 2 (two) times daily.     GINKGO BILOBA PO Take 1 tablet by mouth daily.     losartan (COZAAR) 50 MG tablet TAKE 1 TABLET BY MOUTH DAILY. 30 tablet 3   MAGNESIUM PO Take 1 tablet by mouth every other day.     Multiple Vitamin (MULTIVITAMIN) tablet Take 1 tablet by mouth daily.     naproxen (NAPROSYN) 500 MG tablet Take 500 mg by mouth 2 (two) times daily as needed for moderate pain.     Omega-3 Fatty Acids (FISH OIL) 1000 MG CAPS Take 1,000 mg by mouth daily.     No current facility-administered medications for this visit.    Allergies:   Lisinopril    Social History:  The patient  reports that he quit smoking about 18 years ago. His smoking use included cigarettes. He started smoking about 33 years ago. He has a 15.00 pack-year smoking history. He has never used smokeless tobacco. He reports current alcohol use. He reports that he does not use drugs.   Family History:  The patient's family history includes COPD in his father, mother, and sister; Cancer in his father; Depression in his daughter; Heart attack in his mother; Heart attack (age of onset: 42) in his brother; Heart disease in his father and mother; Hypertension in his daughter and father; Suicidality in his brother.    ROS:  Please see the history of present illness.   Otherwise, review of systems are positive for none.   All other systems are reviewed and negative.    PHYSICAL EXAM: VS:  BP 128/70 (BP Location: Left Arm, Patient Position: Sitting, Cuff Size: Normal)    Pulse 74    Ht 5\' 9"  (1.753 m)    Wt 213 lb (96.6 kg)    SpO2 98%    BMI 31.45 kg/m  , BMI Body mass index is 31.45 kg/m. GEN: Well nourished, well developed, in no acute distress  HEENT: normal  Neck: no JVD, carotid bruits, or masses Cardiac: RRR; no murmurs, rubs, or gallops,no edema  Respiratory:  clear to auscultation bilaterally, normal work of breathing GI: soft, nontender, nondistended, +  BS MS: no deformity or atrophy  Skin: warm and dry, no rash Neuro:  Strength and sensation are intact Psych: euthymic mood, full affect   EKG:  EKG is ordered today. The ekg ordered today demonstrates normal sinus rhythm with left anterior fascicular block, moderate LVH   Recent Labs: 03/21/2020: Magnesium 2.2 06/18/2020: ALT 18 10/08/2020: BUN 16; Creatinine, Ser 0.97; Hemoglobin 14.2; Platelets 210; Potassium 4.1; Sodium 137    Lipid Panel    Component Value Date/Time   CHOL 131 06/18/2020 0834   CHOL 147 12/20/2017 1516   TRIG 140 06/18/2020 0834   TRIG 108 02/27/2013 1609   HDL 49 06/18/2020  0834   HDL 56 12/20/2017 1516   HDL 42 02/27/2013 1609   CHOLHDL 2.7 06/18/2020 0834   VLDL 14 11/27/2018 1657   LDLCALC 60 06/18/2020 0834   LDLCALC 76 02/27/2013 1609      Wt Readings from Last 3 Encounters:  11/04/20 213 lb (96.6 kg)  10/10/20 206 lb (93.4 kg)  10/08/20 209 lb 6.4 oz (95 kg)        ASSESSMENT AND PLAN:   1.  Coronary artery disease involving native coronary arteries without angina: CTA last year showed moderate nonobstructive disease.  Recommend continuing aggressive medical therapy.  Continue low-dose aspirin.  I discussed with him the importance of healthy lifestyle changes.  2.  Palpitations: Short runs of SVT noted last year: He reports improvement in symptoms with small dose carvedilol.  3. Hyperlipidemia: Continue treatment with atorvastatin.  Most recent lipid profile showed an LDL of  60.  4. Essential hypertension: Blood pressure is well controlled on current medications.    Disposition:   FU with me in 1 year  Signed,  Kathlyn Sacramento, MD  11/04/2020 3:05 PM    [facility]

## 2020-11-04 NOTE — Patient Instructions (Signed)

## 2020-11-14 ENCOUNTER — Other Ambulatory Visit: Payer: Self-pay | Admitting: Neurological Surgery

## 2020-12-01 ENCOUNTER — Other Ambulatory Visit: Payer: Self-pay

## 2020-12-01 MED FILL — Carvedilol Tab 3.125 MG: ORAL | 30 days supply | Qty: 60 | Fill #0 | Status: AC

## 2020-12-15 ENCOUNTER — Other Ambulatory Visit: Payer: Self-pay

## 2020-12-15 MED FILL — Atorvastatin Calcium Tab 40 MG (Base Equivalent): ORAL | 30 days supply | Qty: 30 | Fill #0 | Status: AC

## 2020-12-15 MED FILL — Losartan Potassium Tab 50 MG: ORAL | 30 days supply | Qty: 30 | Fill #0 | Status: AC

## 2020-12-17 ENCOUNTER — Ambulatory Visit: Payer: 59 | Admitting: Family Medicine

## 2020-12-24 ENCOUNTER — Other Ambulatory Visit: Payer: Self-pay

## 2020-12-24 MED FILL — Carvedilol Tab 3.125 MG: ORAL | 30 days supply | Qty: 60 | Fill #1 | Status: AC

## 2021-01-20 ENCOUNTER — Other Ambulatory Visit: Payer: Self-pay | Admitting: Cardiovascular Disease

## 2021-01-20 ENCOUNTER — Ambulatory Visit: Payer: BC Managed Care – PPO | Attending: Internal Medicine

## 2021-01-20 ENCOUNTER — Other Ambulatory Visit: Payer: Self-pay

## 2021-01-20 DIAGNOSIS — Z23 Encounter for immunization: Secondary | ICD-10-CM

## 2021-01-20 DIAGNOSIS — I10 Essential (primary) hypertension: Secondary | ICD-10-CM

## 2021-01-20 MED ORDER — LOSARTAN POTASSIUM 50 MG PO TABS
ORAL_TABLET | Freq: Every day | ORAL | 8 refills | Status: DC
  Filled 2021-01-20 – 2021-01-28 (×2): qty 30, 30d supply, fill #0
  Filled 2021-03-02: qty 30, 30d supply, fill #1
  Filled 2021-03-23: qty 30, 30d supply, fill #2
  Filled 2021-05-07: qty 30, 30d supply, fill #3
  Filled 2021-06-25: qty 30, 30d supply, fill #4
  Filled 2021-07-20: qty 90, 90d supply, fill #5

## 2021-01-20 MED ORDER — PFIZER-BIONT COVID-19 VAC-TRIS 30 MCG/0.3ML IM SUSP
INTRAMUSCULAR | 0 refills | Status: DC
  Filled 2021-01-20: qty 0.3, 1d supply, fill #0

## 2021-01-20 MED FILL — Atorvastatin Calcium Tab 40 MG (Base Equivalent): ORAL | 30 days supply | Qty: 30 | Fill #1 | Status: AC

## 2021-01-20 MED FILL — Gabapentin Cap 100 MG: ORAL | 30 days supply | Qty: 90 | Fill #0 | Status: AC

## 2021-01-20 MED FILL — Naproxen Tab 500 MG: ORAL | 30 days supply | Qty: 60 | Fill #0 | Status: AC

## 2021-01-20 NOTE — Progress Notes (Signed)
   Covid-19 Vaccination Clinic  Name:  Eban Weick III    MRN: [id number] DOB: May 22, 1956  01/20/2021  Mr. Semel was observed post Covid-19 immunization for 15 minutes without incident. He was provided with Vaccine Information Sheet and instruction to access the V-Safe system.   Mr. Delph was instructed to call 911 with any severe reactions post vaccine: Marland Kitchen Difficulty breathing  . Swelling of face and throat  . A fast heartbeat  . A bad rash all over body  . Dizziness and weakness   Immunizations Administered    Name Date Dose VIS Date Route   PFIZER Comrnaty(Gray TOP) Covid-19 Vaccine 01/20/2021  2:32 PM 0.3 mL 07/31/2020 Intramuscular   Manufacturer: [employer]   Lot: [id number]   NDC: [phone]

## 2021-01-28 ENCOUNTER — Other Ambulatory Visit: Payer: Self-pay

## 2021-02-03 ENCOUNTER — Other Ambulatory Visit: Payer: Self-pay

## 2021-02-03 MED FILL — Carvedilol Tab 3.125 MG: ORAL | 30 days supply | Qty: 60 | Fill #2 | Status: AC

## 2021-02-25 ENCOUNTER — Other Ambulatory Visit: Payer: Self-pay

## 2021-02-25 MED FILL — Carvedilol Tab 3.125 MG: ORAL | 30 days supply | Qty: 60 | Fill #3 | Status: CN

## 2021-03-02 ENCOUNTER — Other Ambulatory Visit: Payer: Self-pay

## 2021-03-02 MED FILL — Atorvastatin Calcium Tab 40 MG (Base Equivalent): ORAL | 30 days supply | Qty: 30 | Fill #2 | Status: AC

## 2021-03-16 NOTE — Progress Notes (Signed)
Name: Richard Mueller   MRN: [id number]    DOB: 05-10-1956   Date:03/17/2021       Progress Note  Subjective  Chief Complaint  Right Flank Pain  HPI  Right lower back pain: he had a lumbar fusion 09/2020, pain completely resolved for a few months , however has a different type of pain over the past couple of months. It is constant but more intense when he goes to bed at night and when he first gets up in the morning. Usually dull but with certain movements such as twisting it is more intense and sharp. Sometimes is radiates to right groin area, but not to his leg. No bladder or bowel incontinence. Denies saddle anesthesia.   During evaluation for back problems in Nov 2021 T1 showed a lesion had repeat MRI and was advised to consider short term interval, however he has a metal plate and cannot have MRI at this time.   SVT/CAD/Atherosclerosis of Aorta: he has been on statin, aspirin and carvedilol, no recent episodes of palpitation. Under the care of Dr. Fletcher Anon and LDL is at goal . No chest pain or SOB  Adrenal incidentaloma: Left side, he is aware, no symptoms, bp is at goal, normal potassium levels  Metabolic syndrome: he [facility] for diabetic [facility] classes, changed his diet and last A1C was normal at 5.5 %   Patient Active Problem List   Diagnosis Date Noted   Spondylolisthesis of lumbar region 10/10/2020   Personal history of colonic polyps    Polyp of sigmoid colon    Elevated coronary artery calcium score 06/18/2020   SVT (supraventricular tachycardia) ([place]) 06/18/2020   Adrenal incidentaloma ([place]) 12/18/2019   Basal cell carcinoma, forehead 08/07/2019   Neck pain 06/19/2019   Atherosclerosis of aorta ([place]) 12/06/2018   Degenerative disc disease, lumbar 12/06/2018   Adenoma of left adrenal gland 12/06/2018   PVC (premature ventricular contraction) 12/20/2016   History of squamous cell carcinoma of skin 05/26/2015   Family history of cardiac disorder  03/17/2015   Obesity (BMI 30-39.9) 03/17/2015   Osteoarthrosis 03/17/2015   Perennial allergic rhinitis with seasonal variation 03/17/2015   Hyperlipidemia    Hypertension    Dysmetabolic syndrome [id number]    Past Surgical History:  Procedure Laterality Date   APPENDECTOMY  11/2018   COLONOSCOPY     COLONOSCOPY WITH PROPOFOL N/A 08/08/2020   Procedure: COLONOSCOPY WITH PROPOFOL;  Surgeon: [name], MD;  Location: [place];  Service: Endoscopy;  Laterality: N/A;  priority 4   LAPAROSCOPIC APPENDECTOMY N/A 11/28/2018   Procedure: APPENDECTOMY LAPAROSCOPIC;  Surgeon: Jules Husbands, MD;  Location: [facility];  Service: General;  Laterality: N/A;   ORTHOPEDIC SURGERY     POLYPECTOMY  08/08/2020   Procedure: POLYPECTOMY;  Surgeon: [name], MD;  Location: [place];  Service: Endoscopy;;   SKIN CANCER DESTRUCTION Left 03/06/2015   Hand- Dr. Phillip Heal   SKIN CANCER EXCISION Left 08/2018   Dr. Phillip Heal on Left Arm    TRANSFORAMINAL LUMBAR INTERBODY FUSION W/ MIS 1 LEVEL Left 10/10/2020   Procedure: Right Lumbar Five Sacral One Minimally invasive transforaminal lumbar interbody fusion;  Surgeon: Judith Part, MD;  Location: [place];  Service: Neurosurgery;  Laterality: Left;  posterior    Family History  Problem Relation Age of Onset   Heart disease Mother    Heart attack Mother    COPD Mother    Heart disease Father    Hypertension  Father    COPD Father    Cancer Father         Prostate and Skin   COPD Sister    Suicidality Brother    Heart attack Brother 54       2014   CAD Brother    Hypertension Daughter    Depression Daughter     Social History   Tobacco Use   Smoking status: Former    Packs/day: 1.00    Years: 15.00    Pack years: 15.00    Types: Cigarettes    Start date: 08/24/1987    Quit date: 08/26/2002    Years since quitting: 18.5   Smokeless tobacco: Never  Substance Use Topics   Alcohol use: Yes    Alcohol/week: 0.0 standard  drinks    Comment: ocassional     Current Outpatient Medications:    Ascorbic Acid (VITAMIN C PO), Take 1 tablet by mouth daily., Disp: , Rfl:    aspirin 81 MG tablet, Take 1 tablet (81 mg total) by mouth daily. Okay to restart on 10/17/20, Disp: 30 tablet, Rfl:    atorvastatin (LIPITOR) 40 MG tablet, TAKE 1 TABLET BY MOUTH DAILY., Disp: 90 tablet, Rfl: 3   b complex vitamins tablet, Take 1 tablet by mouth daily., Disp: , Rfl:    calcium carbonate (OS-CAL - DOSED IN MG OF ELEMENTAL CALCIUM) 1250 (500 Ca) MG tablet, Take 1 tablet by mouth daily., Disp: , Rfl:    carvedilol (COREG) 3.125 MG tablet, TAKE 1 TABLET BY MOUTH 2 TIMES DAILY., Disp: 180 tablet, Rfl: 3   Cholecalciferol (VITAMIN D) 50 MCG (2000 UT) tablet, Take 2,000 Units by mouth daily., Disp: , Rfl:    losartan (COZAAR) 50 MG tablet, TAKE 1 TABLET BY MOUTH DAILY., Disp: 30 tablet, Rfl: 8   MAGNESIUM PO, Take 1 tablet by mouth every other day., Disp: , Rfl:    Multiple Vitamin (MULTIVITAMIN) tablet, Take 1 tablet by mouth daily., Disp: , Rfl:    Omega-3 Fatty Acids (FISH OIL) 1000 MG CAPS, Take 1,000 mg by mouth daily., Disp: , Rfl:    gabapentin (NEURONTIN) 100 MG capsule, Take 100 mg by mouth 2 (two) times daily. (Patient not taking: Reported on 03/17/2021), Disp: , Rfl:    naproxen (NAPROSYN) 500 MG tablet, TAKE 1 TABLET BY MOUTH TWICE DAILY (Patient not taking: Reported on 03/17/2021), Disp: 60 tablet, Rfl: 5  Allergies  Allergen Reactions   Lisinopril Rash    pins and needles in arms    I personally reviewed active problem list, medication list, allergies, family history, social history, health maintenance with the patient/caregiver today.   ROS  Constitutional: Negative for fever or weight change.  Respiratory: Negative for cough and shortness of breath.   Cardiovascular: Negative for chest pain or palpitations.  Gastrointestinal: Negative for abdominal pain, no bowel changes.  Musculoskeletal: Negative for gait problem  or joint swelling.  Skin: Negative for rash.  Neurological: Negative for dizziness or headache.  No other specific complaints in a complete review of systems (except as listed in HPI above).   Objective  Vitals:   03/17/21 0737  BP: 126/80  Pulse: 85  Resp: 16  Temp: 97.8 F (36.6 C)  SpO2: 98%  Weight: 218 lb (98.9 kg)  Height: '5\' 9"'$  (1.753 m)    Body mass index is 32.19 kg/m.  Physical Exam  Constitutional: Patient appears well-developed and well-nourished. Obese  No distress.  HEENT: head atraumatic, normocephalic, pupils equal and reactive  to light, neck supple Cardiovascular: Normal rate, regular rhythm and normal heart sounds.  No murmur heard. No BLE edema. Pulmonary/Chest: Effort normal and breath sounds normal. No respiratory distress. Abdominal: Soft.  There is no tenderness.Negative CVA tenderness  Psychiatric: Patient has a normal mood and affect. behavior is normal. Judgment and thought content normal.  Muscular Skeletal: right lower back pain, swollen on right lower back, incisions in good aspect , negative straight leg raise   PHQ2/9: Depression screen [facility] 2/9 03/17/2021 06/18/2020 12/18/2019 11/27/2019 06/08/2019  Decreased Interest 0 0 0 0 0  Down, Depressed, Hopeless 0 0 0 0 0  PHQ - 2 Score 0 0 0 0 0  Altered sleeping - - 0 0 0  Tired, decreased energy - - 0 0 0  Change in appetite - - 0 0 0  Feeling bad or failure about yourself  - - 0 0 0  Trouble concentrating - - 0 0 0  Moving slowly or fidgety/restless - - 0 0 0  Suicidal thoughts - - 0 0 0  PHQ-9 Score - - 0 0 0  Difficult doing work/chores - - - Not difficult at all Not difficult at all    phq 9 is negative   Fall Risk: Fall Risk  03/17/2021 06/18/2020 12/18/2019 11/27/2019 06/13/2019  Falls in the past year? 0 0 0 0 0  Number falls in past yr: 0 0 0 0 0  Injury with Fall? 0 0 0 0 0     Functional Status Survey: Is the patient deaf or have difficulty hearing?: No Does the patient have  difficulty seeing, even when wearing glasses/contacts?: No Does the patient have difficulty concentrating, remembering, or making decisions?: No Does the patient have difficulty walking or climbing stairs?: No Does the patient have difficulty dressing or bathing?: No Does the patient have difficulty doing errands alone such as visiting a doctor's office or shopping?: No    Assessment & Plan  1. Chronic right-sided low back pain without sciatica  - Ambulatory referral to Neurosurgery  Discussed prednisone taper or nsaid's , also may have to resume gabapentin but he prefers to see Dr. Zada Finders first . Referral placed   2. Abnormal MRI, thoracic spine  - Ambulatory referral to Neurosurgery  3. SVT (supraventricular tachycardia) ([place])   4. Adrenal incidentaloma ([place])  Reviewed studies   5. Essential hypertension  At goal   6. Dysmetabolic syndrome  Continue life style modification   7. Elevated coronary artery calcium score   8. Atherosclerosis of aorta ([facility])  On statin therapy, aspirin   9. Degenerative disc disease, lumbar  - Ambulatory referral to Neurosurgery  10. History of back surgery  - Ambulatory referral to Neurosurgery   11. Need for pneumococcal vaccine  - Pneumococcal conjugate vaccine 20-valent (Prevnar 20)

## 2021-03-17 ENCOUNTER — Encounter: Payer: Self-pay | Admitting: Family Medicine

## 2021-03-17 ENCOUNTER — Ambulatory Visit (INDEPENDENT_AMBULATORY_CARE_PROVIDER_SITE_OTHER): Payer: BC Managed Care – PPO | Admitting: Family Medicine

## 2021-03-17 ENCOUNTER — Other Ambulatory Visit: Payer: Self-pay

## 2021-03-17 VITALS — BP 126/80 | HR 85 | Temp 97.8°F | Resp 16 | Ht 69.0 in | Wt 218.0 lb

## 2021-03-17 DIAGNOSIS — I471 Supraventricular tachycardia, unspecified: Secondary | ICD-10-CM

## 2021-03-17 DIAGNOSIS — E8881 Metabolic syndrome: Secondary | ICD-10-CM

## 2021-03-17 DIAGNOSIS — Z9889 Other specified postprocedural states: Secondary | ICD-10-CM

## 2021-03-17 DIAGNOSIS — R937 Abnormal findings on diagnostic imaging of other parts of musculoskeletal system: Secondary | ICD-10-CM

## 2021-03-17 DIAGNOSIS — R931 Abnormal findings on diagnostic imaging of heart and coronary circulation: Secondary | ICD-10-CM

## 2021-03-17 DIAGNOSIS — I700 Atherosclerosis of aorta: Secondary | ICD-10-CM

## 2021-03-17 DIAGNOSIS — M545 Low back pain, unspecified: Secondary | ICD-10-CM

## 2021-03-17 DIAGNOSIS — Z23 Encounter for immunization: Secondary | ICD-10-CM | POA: Diagnosis not present

## 2021-03-17 DIAGNOSIS — G8929 Other chronic pain: Secondary | ICD-10-CM

## 2021-03-17 DIAGNOSIS — E278 Other specified disorders of adrenal gland: Secondary | ICD-10-CM | POA: Diagnosis not present

## 2021-03-17 DIAGNOSIS — I10 Essential (primary) hypertension: Secondary | ICD-10-CM

## 2021-03-17 DIAGNOSIS — M5136 Other intervertebral disc degeneration, lumbar region: Secondary | ICD-10-CM

## 2021-03-17 DIAGNOSIS — M51369 Other intervertebral disc degeneration, lumbar region without mention of lumbar back pain or lower extremity pain: Secondary | ICD-10-CM

## 2021-03-23 ENCOUNTER — Other Ambulatory Visit: Payer: Self-pay

## 2021-03-23 MED FILL — Atorvastatin Calcium Tab 40 MG (Base Equivalent): ORAL | 30 days supply | Qty: 30 | Fill #3 | Status: AC

## 2021-03-24 ENCOUNTER — Other Ambulatory Visit: Payer: Self-pay

## 2021-03-26 ENCOUNTER — Other Ambulatory Visit: Payer: Self-pay

## 2021-03-26 MED FILL — Carvedilol Tab 3.125 MG: ORAL | 30 days supply | Qty: 60 | Fill #0 | Status: AC

## 2021-04-02 DIAGNOSIS — Z6830 Body mass index (BMI) 30.0-30.9, adult: Secondary | ICD-10-CM | POA: Diagnosis not present

## 2021-04-02 DIAGNOSIS — M461 Sacroiliitis, not elsewhere classified: Secondary | ICD-10-CM | POA: Diagnosis not present

## 2021-04-14 DIAGNOSIS — M461 Sacroiliitis, not elsewhere classified: Secondary | ICD-10-CM | POA: Diagnosis not present

## 2021-05-07 ENCOUNTER — Other Ambulatory Visit: Payer: Self-pay

## 2021-05-07 ENCOUNTER — Other Ambulatory Visit: Payer: Self-pay | Admitting: Cardiovascular Disease

## 2021-05-07 MED FILL — Carvedilol Tab 3.125 MG: ORAL | 90 days supply | Qty: 180 | Fill #0 | Status: AC

## 2021-05-07 MED FILL — Atorvastatin Calcium Tab 40 MG (Base Equivalent): ORAL | 30 days supply | Qty: 30 | Fill #4 | Status: AC

## 2021-05-15 ENCOUNTER — Ambulatory Visit: Payer: Medicare Other | Attending: Internal Medicine

## 2021-05-15 ENCOUNTER — Other Ambulatory Visit: Payer: Self-pay

## 2021-05-15 DIAGNOSIS — Z23 Encounter for immunization: Secondary | ICD-10-CM

## 2021-05-15 MED ORDER — PFIZER COVID-19 VAC BIVALENT 30 MCG/0.3ML IM SUSP
INTRAMUSCULAR | 0 refills | Status: DC
  Filled 2021-05-15: qty 0.3, 1d supply, fill #0

## 2021-05-15 NOTE — Progress Notes (Signed)
   Covid-19 Vaccination Clinic  Name:  Richard Mueller    MRN: [id number] DOB: 1956-07-08  05/15/2021  Richard Mueller was observed post Covid-19 immunization for 15 minutes without incident. He was provided with Vaccine Information Sheet and instruction to access the V-Safe system.   Richard Mueller was instructed to call 911 with any severe reactions post vaccine: Difficulty breathing  Swelling of face and throat  A fast heartbeat  A bad rash all over body  Dizziness and weakness   Lu Duffel, PharmD, MBA Clinical Acute Care Pharmacist

## 2021-05-28 ENCOUNTER — Other Ambulatory Visit: Payer: Self-pay

## 2021-05-28 DIAGNOSIS — E278 Other specified disorders of adrenal gland: Secondary | ICD-10-CM

## 2021-05-28 DIAGNOSIS — D3502 Benign neoplasm of left adrenal gland: Secondary | ICD-10-CM

## 2021-06-02 NOTE — Addendum Note (Signed)
Addended by: Lesly Rubenstein on: 06/02/2021 04:28 PM   Modules accepted: Orders

## 2021-06-02 NOTE — Addendum Note (Signed)
Addended by: Lesly Rubenstein on: 06/02/2021 04:12 PM   Modules accepted: Orders

## 2021-06-02 NOTE — Addendum Note (Signed)
Addended by: Lesly Rubenstein on: 06/02/2021 04:19 PM   Modules accepted: Orders

## 2021-06-09 ENCOUNTER — Other Ambulatory Visit: Payer: Self-pay

## 2021-06-09 MED ORDER — DESONIDE 0.05 % EX CREA
TOPICAL_CREAM | CUTANEOUS | 0 refills | Status: DC
  Filled 2021-06-09: qty 15, 7d supply, fill #0

## 2021-06-10 ENCOUNTER — Other Ambulatory Visit: Payer: Self-pay

## 2021-06-11 ENCOUNTER — Other Ambulatory Visit: Payer: Self-pay

## 2021-06-11 ENCOUNTER — Ambulatory Visit (INDEPENDENT_AMBULATORY_CARE_PROVIDER_SITE_OTHER): Payer: Medicare Other

## 2021-06-11 DIAGNOSIS — Z23 Encounter for immunization: Secondary | ICD-10-CM | POA: Diagnosis not present

## 2021-06-22 ENCOUNTER — Other Ambulatory Visit: Payer: Self-pay | Admitting: Neurological Surgery

## 2021-06-22 DIAGNOSIS — M4306 Spondylolysis, lumbar region: Secondary | ICD-10-CM

## 2021-06-23 ENCOUNTER — Encounter: Payer: Self-pay | Admitting: Family Medicine

## 2021-06-25 ENCOUNTER — Other Ambulatory Visit: Payer: Self-pay

## 2021-06-25 ENCOUNTER — Other Ambulatory Visit: Payer: Self-pay | Admitting: Cardiovascular Disease

## 2021-06-25 MED FILL — Atorvastatin Calcium Tab 40 MG (Base Equivalent): ORAL | 90 days supply | Qty: 90 | Fill #0 | Status: AC

## 2021-06-26 ENCOUNTER — Ambulatory Visit (HOSPITAL_COMMUNITY): Payer: Medicare Other

## 2021-06-26 ENCOUNTER — Ambulatory Visit
Admission: RE | Admit: 2021-06-26 | Discharge: 2021-06-26 | Disposition: A | Discharge status: Home / Self Care | Payer: Medicare Other | Source: Ambulatory Visit | Attending: Neurological Surgery | Admitting: Neurological Surgery

## 2021-06-26 ENCOUNTER — Other Ambulatory Visit: Payer: Self-pay

## 2021-06-26 DIAGNOSIS — M4306 Spondylolysis, lumbar region: Secondary | ICD-10-CM

## 2021-07-02 ENCOUNTER — Other Ambulatory Visit: Payer: Self-pay

## 2021-07-02 ENCOUNTER — Other Ambulatory Visit: Payer: Self-pay | Admitting: Cardiovascular Disease

## 2021-07-02 MED FILL — Naproxen Tab 500 MG: ORAL | 30 days supply | Qty: 60 | Fill #1 | Status: AC

## 2021-07-03 ENCOUNTER — Other Ambulatory Visit: Payer: Self-pay

## 2021-07-03 ENCOUNTER — Telehealth: Payer: Self-pay | Admitting: Surgery

## 2021-07-03 MED ORDER — ATORVASTATIN CALCIUM 40 MG PO TABS
ORAL_TABLET | Freq: Every day | ORAL | 0 refills | Status: DC
  Filled 2021-07-03 – 2021-09-09 (×2): qty 90, 90d supply, fill #0

## 2021-07-03 NOTE — Telephone Encounter (Signed)
Patient calls to let us know that he also had CT lumbar spine done on 06/26/21.  He will still keep his appointment for CT abdomen, but patient is concerned because they looked at the results of his CT lumbar through their mychart and it shows that the adrenal gland mass has grown in size.  Thank you.

## 2021-07-08 ENCOUNTER — Other Ambulatory Visit: Payer: Self-pay

## 2021-07-08 ENCOUNTER — Ambulatory Visit
Admission: RE | Admit: 2021-07-08 | Discharge: 2021-07-08 | Disposition: A | Discharge status: Home / Self Care | Payer: Medicare Other | Source: Ambulatory Visit | Attending: Surgery | Admitting: Surgery

## 2021-07-08 DIAGNOSIS — D3502 Benign neoplasm of left adrenal gland: Secondary | ICD-10-CM | POA: Insufficient documentation

## 2021-07-15 ENCOUNTER — Other Ambulatory Visit: Payer: Self-pay

## 2021-07-15 ENCOUNTER — Encounter: Payer: Self-pay | Admitting: Surgery

## 2021-07-15 ENCOUNTER — Ambulatory Visit (INDEPENDENT_AMBULATORY_CARE_PROVIDER_SITE_OTHER): Payer: Medicare Other | Admitting: Surgery

## 2021-07-15 VITALS — BP 111/74 | HR 59 | Temp 98.0°F | Ht 68.0 in | Wt 216.6 lb

## 2021-07-15 DIAGNOSIS — R931 Abnormal findings on diagnostic imaging of heart and coronary circulation: Secondary | ICD-10-CM

## 2021-07-15 DIAGNOSIS — E278 Other specified disorders of adrenal gland: Secondary | ICD-10-CM | POA: Diagnosis not present

## 2021-07-15 NOTE — Patient Instructions (Signed)
If you have any concerns or questions, please feel free to call our office.   Adrenal Adenoma An adrenal adenoma is a benign tumor of the glands that are located on top of each kidney (adrenal glands). These glands produce hormones. A benign tumor means that the growth is not cancer. A person may have one or more tumors in one or both glands. In almost all cases, adrenal adenomas do not cause any symptoms. These are called nonfunctional adenomas. In rare cases, an adenoma may produce high levels of hormones called cortisol or aldosterone. These tumors are called functional adenomas. Adrenal adenomas become more common as people grow older, but are unlikely to become cancerous. However, nonfunctional adenomas may become functional. What are the causes? In most cases, the cause of this condition is not known. In very rare cases, the condition may be passed from parent to child (inherited). Smoking and tobacco use is associated with significant increases in adrenal adenomas. What are the signs or symptoms? Symptoms of this condition depend on the type of adrenal adenoma that you have. Nonfunctional adrenal adenomas usually do not cause any symptoms. Symptoms of functional adrenal adenomas depend on which hormone is produced in high levels. Tumors that secrete cortisol cause a condition called Cushing's syndrome. Signs and symptoms include: Increased fat in the upper body. Tiredness and loss of energy. Muscle weakness. High blood pressure. High blood sugar. Bruising and purple stretch marks in the skin, usually on the upper body. Facial hair, acne, and menstrual irregularities in women. Tumors that secrete aldosterone cause a condition called primary aldosteronism. Signs and symptoms include: High blood pressure that may be difficult to control. Tiredness and loss of energy. Headache. Weakness or numbness. Low potassium levels in your blood. How is this diagnosed? This condition may be  diagnosed based on: Your symptoms. Your health care provider may suspect the condition if you have signs and symptoms of a functional adenoma. A physical exam. Blood and urine tests to check for high levels of hormones. Imaging studies to confirm the diagnosis. These may include: CT scan. MRI. PET scan. Biopsy. For this test, a sample of the tumor is removed and examined in a lab. This is done in rare cases where other tests have not given a clear result. Adrenal adenomas are often found by chance when imaging studies of the abdomen are done for other reasons. How is this treated? Treatment for this condition depends on the type of the adrenal adenoma that you have. You may treated with: Observation. This is done if you have a nonfunctional adrenal adenoma. For observation, you may need: Regular imaging studies to make sure the tumor is not growing. Blood or urine tests to make sure the tumor is not becoming functional. Surgery. This is done if you have a functional adenoma. Surgery is the main treatment for this condition and usually cures it. Medicines. These are used if surgery is not possible. The medicines block the effects of the hormones. Follow these instructions at home: Take over-the-counter and prescription medicines only as told by your health care provider. Return to your normal activities as told by your health care provider. Ask your health care provider what activities are safe for you. Do not use any products that contain nicotine or tobacco. These products include cigarettes, chewing tobacco, and vaping devices, such as e-cigarettes. If you need help quitting, ask your health care provider. Keep all follow-up visits. This is important. This may include visits for regular tests and imaging studies.  Contact a health care provider if: You develop any of the signs or symptoms of Cushing's syndrome or primary aldosteronism. You need help to stop smoking or using other tobacco  products. Summary An adrenal adenoma is a benign tumor of the adrenal gland. Nonfunctional adenomas rarely cause symptoms and do not need to be treated. Functional adenomas produce hormones and may cause symptoms of Cushing's syndrome or primary aldosteronism, depending on the type of hormone they produce. Adrenal adenomas do not become cancerous. Nonfunctional adenomas may become functional. Surgery to remove the tumor is the usual treatment for functional adenomas. This information is not intended to replace advice given to you by your health care provider. Make sure you discuss any questions you have with your health care provider. Document Revised: 04/08/2020 Document Reviewed: 04/08/2020 Elsevier Patient Education  2022 [employer].

## 2021-07-18 ENCOUNTER — Encounter: Payer: Self-pay | Admitting: Surgery

## 2021-07-18 NOTE — Progress Notes (Signed)
Outpatient Surgical Follow Up  07/18/2021  Mekhi Sonn [facility] is an 65 y.o. male.   Chief Complaint  Patient presents with   Follow-up    1 yr f/u rec CT abd/pel 07/08/21 with contrast-adrenal protocol    HPI: Richard Mueller  follows for a left adrenal incidentaloma.  He has been asymptomatic.  CT scan showing a small 2 cm adenoma unchanged in size for the last 2 years it is not functional.  H  I have personally reviewed the images and there is no significant change on the left adrenal incidentaloma.  He Does have hypertension and SVT the hypertension is controlled with a single agent. HE now has back issues and had surgery 9 months ago by Dr. Zada Finders  Past Medical History:  Diagnosis Date   Allergic rhinitis, cause unspecified    Allergy    Bronchitis    Cancer ([place]) 2020   skin cancer   Esophageal reflux    GERD (gastroesophageal reflux disease)    Hyperlipidemia    Hypertension    Metabolic syndrome    Obesity, unspecified    Osteoarthritis    Osteoarthrosis, unspecified whether generalized or localized, unspecified site    Pneumonia    Right upper quadrant pain     Past Surgical History:  Procedure Laterality Date   APPENDECTOMY  11/2018   COLONOSCOPY     COLONOSCOPY WITH PROPOFOL N/A 08/08/2020   Procedure: COLONOSCOPY WITH PROPOFOL;  Surgeon: [name], MD;  Location: [place];  Service: Endoscopy;  Laterality: N/A;  priority 4   LAPAROSCOPIC APPENDECTOMY N/A 11/28/2018   Procedure: APPENDECTOMY LAPAROSCOPIC;  Surgeon: Jules Husbands, MD;  Location: [facility];  Service: General;  Laterality: N/A;   ORTHOPEDIC SURGERY     POLYPECTOMY  08/08/2020   Procedure: POLYPECTOMY;  Surgeon: [name], MD;  Location: [place];  Service: Endoscopy;;   SKIN CANCER DESTRUCTION Left 03/06/2015   Hand- Dr. Phillip Heal   SKIN CANCER EXCISION Left 08/2018   Dr. Phillip Heal on Left Arm    TRANSFORAMINAL LUMBAR INTERBODY FUSION W/ MIS 1 LEVEL Left 10/10/2020   Procedure:  Right Lumbar Five Sacral One Minimally invasive transforaminal lumbar interbody fusion;  Surgeon: Judith Part, MD;  Location: [place];  Service: Neurosurgery;  Laterality: Left;  posterior    Family History  Problem Relation Age of Onset   Heart disease Mother    Heart attack Mother    COPD Mother    Heart disease Father    Hypertension Father    COPD Father    Cancer Father         Prostate and Skin   COPD Sister    Suicidality Brother    Heart attack Brother 54       2014   CAD Brother    Hypertension Daughter    Depression Daughter     Social History:  reports that he quit smoking about 18 years ago. His smoking use included cigarettes. He started smoking about 33 years ago. He has a 15.00 pack-year smoking history. He has never used smokeless tobacco. He reports current alcohol use. He reports that he does not use drugs.  Allergies:  Allergies  Allergen Reactions   Lisinopril Rash    pins and needles in arms    Medications reviewed.    ROS Full ROS performed and is otherwise negative other than what is stated in HPI   BP 111/74   Pulse (!) 59   Temp 98 F (36.7  C) (Oral)   Ht 5\' 8"  (1.727 m)   Wt 216 lb 9.6 oz (98.2 kg)   SpO2 95%   BMI 32.93 kg/m   Physical Exam Vitals and nursing note reviewed. Exam conducted with a chaperone present.  Constitutional:      Appearance: Normal appearance. He is normal weight.  Cardiovascular:     Rate and Rhythm: Normal rate and regular rhythm.  Pulmonary:     Effort: Pulmonary effort is normal. No respiratory distress.     Breath sounds: Normal breath sounds. No stridor. No wheezing.  Abdominal:     General: Abdomen is flat. There is no distension.     Palpations: Abdomen is soft. There is no mass.     Tenderness: There is no abdominal tenderness. There is no guarding or rebound.     Hernia: No hernia is present.  Musculoskeletal:     Cervical back: Normal range of motion and neck supple.  Skin:    General:  Skin is warm and dry.     Capillary Refill: Capillary refill takes less than 2 seconds.  Neurological:     General: No focal deficit present.     Mental Status: He is alert and oriented to person, place, and time.  Psychiatric:        Mood and Affect: Mood normal.        Behavior: Behavior normal.        Thought Content: Thought content normal.        Judgment: Judgment normal.     Assessment/Plan:  Stable left adrenal incidentaloma.  Stability for greater than 2 years.  Discussed with him about it very unlikely that this will require any surgical invention.  He will follow-up on a as needed basis. Greater than 50% of the 30 minutes  visit was spent in counseling/coordination of care   Caroleen Hamman, MD [place] Surgeon

## 2021-07-20 ENCOUNTER — Other Ambulatory Visit: Payer: Self-pay

## 2021-07-20 DIAGNOSIS — I10 Essential (primary) hypertension: Secondary | ICD-10-CM

## 2021-07-20 MED ORDER — LOSARTAN POTASSIUM 50 MG PO TABS
ORAL_TABLET | Freq: Every day | ORAL | 0 refills | Status: DC
  Filled 2021-07-20: qty 90, fill #0
  Filled 2021-08-11: qty 90, 90d supply, fill #0

## 2021-07-21 ENCOUNTER — Other Ambulatory Visit: Payer: Self-pay | Admitting: Neurological Surgery

## 2021-08-03 ENCOUNTER — Other Ambulatory Visit (HOSPITAL_COMMUNITY): Payer: Self-pay

## 2021-08-03 ENCOUNTER — Other Ambulatory Visit: Payer: Self-pay

## 2021-08-11 ENCOUNTER — Other Ambulatory Visit: Payer: Self-pay

## 2021-08-11 MED FILL — Carvedilol Tab 3.125 MG: ORAL | 90 days supply | Qty: 180 | Fill #1 | Status: AC

## 2021-08-28 ENCOUNTER — Other Ambulatory Visit: Payer: Self-pay

## 2021-09-09 ENCOUNTER — Other Ambulatory Visit: Payer: Self-pay

## 2021-09-15 NOTE — Pre-Procedure Instructions (Signed)
Surgical Instructions    Your procedure is scheduled on Tuesday, January 31st.  Report to [facility] Main Entrance "A" at 5:30 A.M., then check in with the Admitting office.  Call this number if you have problems the morning of surgery:  [phone]   If you have any questions prior to your surgery date call [phone]: Open Monday-Friday 8am-4pm    Remember:  Do not eat after midnight the night before your surgery  You may drink clear liquids until 4:30 a.m. the morning of your surgery.   Clear liquids allowed are: Water, Non-Citrus Juices (without pulp), Carbonated Beverages, Clear Tea, Black Coffee ONLY (NO MILK, CREAM OR POWDERED CREAMER of any kind), and Gatorade    Take these medicines the morning of surgery with A SIP OF WATER atorvastatin (LIPITOR) carvedilol (COREG)   Follow your surgeon's instructions on when to stop Aspirin.  If no instructions were given by your surgeon then you will need to call the office to get those instructions.    As of today, STOP taking any Aleve, Naproxen, Ibuprofen, Motrin, Advil, Goody's, BC's, all herbal medications, fish oil, and all vitamins.  After your COVID test   You are not required to quarantine however you are required to wear a well-fitting mask when you are out and around people not in your household.  If your mask becomes wet or soiled, replace with a new one.  Wash your hands often with soap and water for 20 seconds or clean your hands with an alcohol-based hand sanitizer that contains at least 60% alcohol.  Do not share personal items.  Notify your provider: if you are in close contact with someone who has COVID  or if you develop a fever of 100.4 or greater, sneezing, cough, sore throat, shortness of breath or body aches.           Do not wear jewelry Do not wear lotions, powders, colognes, or deodorant. Do not shave 48 hours prior to surgery.  Men may shave face and neck. Do not bring valuables to the hospital. DO  Not wear nail polish, gel polish, artificial nails, or any other type of covering on natural nails (fingers and toes) If you have artificial nails or gel coating that need to be removed by a nail salon, please have this removed prior to surgery. Artificial nails or gel coating may interfere with anesthesia's ability to adequately monitor your vital signs.             [facility] is not responsible for any belongings or valuables.  Do NOT Smoke (Tobacco/Vaping)  24 hours prior to your procedure  If you use a CPAP at night, you may bring your mask for your overnight stay.   Contacts, glasses, hearing aids, dentures or partials may not be worn into surgery, please bring cases for these belongings   For patients admitted to the hospital, discharge time will be determined by your treatment team.   Patients discharged the day of surgery will not be allowed to drive home, and someone needs to stay with them for 24 hours.  NO VISITORS WILL BE ALLOWED IN PRE-OP WHERE PATIENTS ARE PREPPED FOR SURGERY.  ONLY 1 SUPPORT PERSON MAY BE PRESENT IN THE WAITING ROOM WHILE YOU ARE IN SURGERY.  IF YOU ARE TO BE ADMITTED, ONCE YOU ARE IN YOUR ROOM YOU WILL BE ALLOWED TWO (2) VISITORS. 1 (ONE) VISITOR MAY STAY OVERNIGHT BUT MUST ARRIVE TO THE ROOM BY 8pm.  Minor children may have  two parents present. Special consideration for safety and communication needs will be reviewed on a case by case basis.  Special instructions:    Oral Hygiene is also important to reduce your risk of infection.  Remember - BRUSH YOUR TEETH THE MORNING OF SURGERY WITH YOUR REGULAR TOOTHPASTE   [place]- Preparing For Surgery  Before surgery, you can play an important role. Because skin is not sterile, your skin needs to be as free of germs as possible. You can reduce the number of germs on your skin by washing with CHG (chlorahexidine gluconate) Soap before surgery.  CHG is an antiseptic cleaner which kills germs and bonds with the skin  to continue killing germs even after washing.     Please do not use if you have an allergy to CHG or antibacterial soaps. If your skin becomes reddened/irritated stop using the CHG.  Do not shave (including legs and underarms) for at least 48 hours prior to first CHG shower. It is OK to shave your face.  Please follow these instructions carefully.     Shower the NIGHT BEFORE SURGERY and the MORNING OF SURGERY with CHG Soap.   If you chose to wash your hair, wash your hair first as usual with your normal shampoo. After you shampoo, rinse your hair and body thoroughly to remove the shampoo.  Then [employer] and genitals (private parts) with your normal soap and rinse thoroughly to remove soap.  After that Use CHG Soap as you would any other liquid soap. You can apply CHG directly to the skin and wash gently with a scrungie or a clean washcloth.   Apply the CHG Soap to your body ONLY FROM THE NECK DOWN.  Do not use on open wounds or open sores. Avoid contact with your eyes, ears, mouth and genitals (private parts). Wash Face and genitals (private parts)  with your normal soap.   Wash thoroughly, paying special attention to the area where your surgery will be performed.  Thoroughly rinse your body with warm water from the neck down.  DO NOT shower/wash with your normal soap after using and rinsing off the CHG Soap.  Pat yourself dry with a CLEAN TOWEL.  Wear CLEAN PAJAMAS to bed the night before surgery  Place CLEAN SHEETS on your bed the night before your surgery  DO NOT SLEEP WITH PETS.   Day of Surgery:  Take a shower with CHG soap. Wear Clean/Comfortable clothing the morning of surgery Do not apply any deodorants/lotions.   Remember to brush your teeth WITH YOUR REGULAR TOOTHPASTE.   Please read over the following fact sheets that you were given.

## 2021-09-16 ENCOUNTER — Other Ambulatory Visit: Payer: Self-pay

## 2021-09-16 ENCOUNTER — Encounter (HOSPITAL_COMMUNITY): Payer: Self-pay

## 2021-09-16 ENCOUNTER — Encounter (HOSPITAL_COMMUNITY)
Admission: RE | Admit: 2021-09-16 | Discharge: 2021-09-16 | Disposition: A | Discharge status: Home / Self Care | Payer: Medicare Other | Source: Ambulatory Visit | Attending: Neurological Surgery | Admitting: Neurological Surgery

## 2021-09-16 VITALS — BP 152/86 | HR 57 | Temp 98.1°F | Resp 18 | Ht 69.0 in | Wt 220.3 lb

## 2021-09-16 DIAGNOSIS — I2510 Atherosclerotic heart disease of native coronary artery without angina pectoris: Secondary | ICD-10-CM | POA: Diagnosis not present

## 2021-09-16 DIAGNOSIS — I471 Supraventricular tachycardia: Secondary | ICD-10-CM | POA: Insufficient documentation

## 2021-09-16 DIAGNOSIS — I340 Nonrheumatic mitral (valve) insufficiency: Secondary | ICD-10-CM | POA: Insufficient documentation

## 2021-09-16 DIAGNOSIS — Z01818 Encounter for other preprocedural examination: Secondary | ICD-10-CM

## 2021-09-16 DIAGNOSIS — Z01812 Encounter for preprocedural laboratory examination: Secondary | ICD-10-CM | POA: Diagnosis present

## 2021-09-16 LAB — BASIC METABOLIC PANEL
Anion gap: 8 (ref 5–15)
BUN: 17 mg/dL (ref 8–23)
CO2: 24 mmol/L (ref 22–32)
Calcium: 8.9 mg/dL (ref 8.9–10.3)
Chloride: 102 mmol/L (ref 98–111)
Creatinine, Ser: 1.03 mg/dL (ref 0.61–1.24)
GFR, Estimated: >60 mL/min (ref >60)
Glucose, Bld: 97 mg/dL (ref 70–99)
Potassium: 4.4 mmol/L (ref 3.5–5.1)
Sodium: 134 mmol/L — ABNORMAL LOW (ref 135–145)

## 2021-09-16 LAB — CBC
HCT: 41.5 % (ref 39.0–52.0)
Hemoglobin: 13.9 g/dL (ref 13.0–17.0)
MCH: 30.5 pg (ref 26.0–34.0)
MCHC: 33.5 g/dL (ref 30.0–36.0)
MCV: 91.2 fL (ref 80.0–100.0)
Platelets: 203 10*3/uL (ref 150–400)
RBC: 4.55 MIL/uL (ref 4.22–5.81)
RDW: 13.2 % (ref 11.5–15.5)
WBC: 5.6 10*3/uL (ref 4.0–10.5)
nRBC: 0 % (ref 0.0–0.2)

## 2021-09-16 LAB — SURGICAL PCR SCREEN
MRSA, PCR: NEGATIVE
Staphylococcus aureus: NEGATIVE

## 2021-09-16 LAB — TYPE AND SCREEN
ABO/RH(D): A POS
Antibody Screen: NEGATIVE

## 2021-09-16 NOTE — Progress Notes (Signed)
PCP - Steele Sizer Cardiologist - Dr. Fletcher Anon Per Dr. Fletcher Anon, follow up yearly Last office visit 11/04/20  Chest x-ray - n/a EKG - 10/25/20 Stress Test - 03/28/20 ECHO - 04/21/20   Aspirin Instructions: follow your surgeon's instructions on when to stop ASA  ERAS Protcol - yes, no drink ordered or given   COVID TEST- Friday 09/18/21 @ 0830 @ [facility] (per Baker Janus) Surgery Admit   Anesthesia review: yes, heart history Yearly follow up with cardiologist  Patient denies shortness of breath, fever, cough and chest pain at PAT appointment   All instructions explained to the patient, with a verbal understanding of the material. Patient agrees to go over the instructions while at home for a better understanding. Patient also instructed to self quarantine after being tested for COVID-19. The opportunity to ask questions was provided.

## 2021-09-17 NOTE — Progress Notes (Signed)
Anesthesia Chart Review:  Follows with cardiology for history of moderate nonobstructive CAD. He underwent thorough evaluation in 2021 for atypical chest pain and palpitations.  2-week event monitor 1 episode of wide-complex tachycardia lasting only 4 beats and was likely due to SVT with aberrancy although slow nonsustained ventricular tachycardia cannot be excluded.  He was also noted to have short runs of SVT the longest lasted 16 beats.  Echocardiogram showed normal LV systolic function with mild mitral regurgitation and no significant pulmonary hypertension.  Lexiscan Myoview showed fixed apical and inferior defect.  The nuclear stress study was suboptimal and thus he underwent cardiac CTA in September which showed a calcium score of 857 with evidence of moderate stenosis in the proximal LAD as well as mild left circumflex disease.  None of the lesions were significant by FFR.  He was last seen by Dr. Fletcher Anon 11/04/2020 and noted to be doing well at that time, denied any further chest pain.  Recommended continue aggressive medical therapy and follow-up in 1 year.  Preop labs reviewed, unremarkable.  EKG 11/04/2020: NSR.  Rate 74.  LAFB.  Moderate voltage criteria for LVH, may be normal variant.  Cardiac CTA 05/01/20 IMPRESSION: 1. High coronary calcium score of 857. This was 91st percentile for age and sex matched control ([place]). 2. Normal coronary origin with right dominance. 3. Calcified and non calcified plaque in the proximal LAD causing moderate stenosis. 4. Calcified plaque in the proximal LCx and distal LM causing mild stenosis 5. CAD-RADS 3. Moderate stenosis. Consider symptom-guided anti-ischemic pharmacotherapy as well as risk factor modification per guideline directed care. Additional analysis with CT FFR will be submitted and reported separately.  1. Left Main:  No significant stenosis. 2. LAD: No significant stenosis.  FFR 0.9 proximally, 0.84 distally 3. LCX: No significant  stenosis.  FFR 0.91 4. RCA: No significant stenosis.  FFR 0.9 IMPRESSION: 1.  CT FFR analysis didn't show any significant stenosis.   Echo 04/21/2020  1. Left ventricular ejection fraction, by estimation, is 55 to 60%. The  left ventricle has normal function. The left ventricle has no regional  wall motion abnormalities. Left ventricular diastolic parameters were  normal. The average left ventricular  global longitudinal strain is -15.9 %.   2. Right ventricular systolic function is normal. The right ventricular  size is normal. There is normal pulmonary artery systolic pressure.   3. The mitral valve is normal in structure. Mild mitral valve  regurgitation. No evidence of mitral stenosis.   4. The aortic valve is tricuspid. Aortic valve regurgitation is not  visualized. Mild aortic valve sclerosis is present, with no evidence of  aortic valve stenosis.   5. The inferior vena cava is normal in size with greater than 50%  respiratory variability, suggesting right atrial pressure of 3 mmHg.    Cardiac monitoring/ [facility] 03/28/20 Normal sinus rhythm with an average heart rate of 71 bpm. 1 run of wide-complex tachycardia lasting only 4 beats likely SVT with aberrancy although slow nonsustained ventricular tachycardia cannot be excluded. 6 episodes of SVT noted the longest lasted 16 beats. Rare PACs and rare PVCs. Most triggered events did not correlate with arrhythmia.    Richard Mueller [facility]/Anesthesiology Phone [phone] 09/17/2021 4:19 PM

## 2021-09-17 NOTE — Anesthesia Preprocedure Evaluation (Deleted)
Anesthesia Evaluation    Airway        Dental   Pulmonary former smoker,           Cardiovascular hypertension,      Neuro/Psych    GI/Hepatic   Endo/Other    Renal/GU      Musculoskeletal   Abdominal   Peds  Hematology   Anesthesia Other Findings   Reproductive/Obstetrics                             Anesthesia Physical Anesthesia Plan  ASA:   Anesthesia Plan:    Post-op Pain Management:    Induction:   PONV Risk Score and Plan:   Airway Management Planned:   Additional Equipment:   Intra-op Plan:   Post-operative Plan:   Informed Consent:   Plan Discussed with:   Anesthesia Plan Comments: (PAT note by Karoline Caldwell, PA-C: Follows with cardiology for history of moderate nonobstructive CAD. He underwent thorough evaluation in 2021 for atypical chest pain and palpitations. 2-week event monitor 1 episode of wide-complex tachycardia lasting only 4 beats and was likely due to SVT with aberrancy although slow nonsustained ventricular tachycardia cannot be excluded. He was also noted to have short runs of SVT the longest lasted 16 beats. Echocardiogram showed normal LV systolic function with mild mitral regurgitation and no significant pulmonary hypertension. Lexiscan Myoview showed fixed apical and inferior defect. The nuclear stress study was suboptimal and thus he underwent cardiac CTA in September which showed a calcium score of 857 with evidence of moderate stenosis in the proximal LAD as well as mild left circumflex disease. None of the lesions were significant by FFR.  He was last seen by Dr. Fletcher Anon 11/04/2020 and noted to be doing well at that time, denied any further chest pain.  Recommended continue aggressive medical therapy and follow-up in 1 year.  Preop labs reviewed, unremarkable.  EKG 11/04/2020: NSR.  Rate 74.  LAFB.  Moderate voltage criteria for LVH, may be normal  variant.  Cardiac CTA 05/01/20 IMPRESSION: 1. High coronary calcium score of 857. This was 91st percentile for age and sex matched control ([place]). 2. Normal coronary origin with right dominance. 3. Calcified and non calcified plaque in the proximal LAD causing moderate stenosis. 4. Calcified plaque in the proximal LCx and distal LM causing mild stenosis 5. CAD-RADS 3. Moderate stenosis. Consider symptom-guided anti-ischemic pharmacotherapy as well as risk factor modification per guideline directed care. Additional analysis with CT FFR will be submitted and reported separately. 1. Left Main: No significant stenosis. 2. LAD: No significant stenosis. FFR 0.9 proximally, 0.84 distally 3. LCX: No significant stenosis. FFR 0.91 4. RCA: No significant stenosis. FFR 0.9 IMPRESSION: 1. CT FFR analysis didn't show any significant stenosis.  Echo8/30/2021 1. Left ventricular ejection fraction, by estimation, is 55 to 60%. The  left ventricle has normal function. The left ventricle has no regional  wall motion abnormalities. Left ventricular diastolic parameters were  normal. The average left ventricular  global longitudinal strain is -15.9 %.  2. Right ventricular systolic function is normal. The right ventricular  size is normal. There is normal pulmonary artery systolic pressure.  3. The mitral valve is normal in structure. Mild mitral valve  regurgitation. No evidence of mitral stenosis.  4. The aortic valve is tricuspid. Aortic valve regurgitation is not  visualized. Mild aortic valve sclerosis is present, with no evidence of  aortic valve stenosis.  5. The  inferior vena cava is normal in size with greater than 50%  respiratory variability, suggesting right atrial pressure of 3 mmHg.   Cardiac monitoring/ [facility] 03/28/20 Normal sinus rhythm with an average heart rate of 71 bpm. 1 run of wide-complex tachycardia lasting only 4 beats likely SVT with aberrancy although slow  nonsustained ventricular tachycardia cannot be excluded. 6 episodes of SVT noted the longest lasted 16 beats. Rare PACs and rare PVCs. Most triggered events did not correlate with arrhythmia. )        Anesthesia Quick Evaluation

## 2021-09-18 ENCOUNTER — Other Ambulatory Visit: Payer: Self-pay

## 2021-09-18 ENCOUNTER — Other Ambulatory Visit
Admission: RE | Admit: 2021-09-18 | Discharge: 2021-09-18 | Disposition: A | Discharge status: Home / Self Care | Payer: Medicare Other | Source: Ambulatory Visit | Attending: Neurological Surgery | Admitting: Neurological Surgery

## 2021-09-18 DIAGNOSIS — Z20822 Contact with and (suspected) exposure to covid-19: Secondary | ICD-10-CM | POA: Insufficient documentation

## 2021-09-18 DIAGNOSIS — Z01812 Encounter for preprocedural laboratory examination: Secondary | ICD-10-CM | POA: Insufficient documentation

## 2021-09-18 LAB — SARS CORONAVIRUS 2 (TAT 6-24 HRS): SARS Coronavirus 2: NEGATIVE

## 2021-09-21 ENCOUNTER — Telehealth: Payer: Self-pay | Admitting: Cardiovascular Disease

## 2021-09-21 ENCOUNTER — Other Ambulatory Visit: Payer: Self-pay

## 2021-09-21 MED ORDER — ATORVASTATIN CALCIUM 20 MG PO TABS
20.0000 mg | ORAL_TABLET | Freq: Every day | ORAL | 0 refills | Status: DC
  Filled 2021-09-21 – 2021-10-21 (×2): qty 90, 90d supply, fill #0

## 2021-09-21 NOTE — Telephone Encounter (Signed)
Requested Prescriptions   Signed Prescriptions Disp Refills   atorvastatin (LIPITOR) 20 MG tablet 90 tablet 0    Sig: Take 1 tablet (20 mg total) by mouth daily.    Authorizing Provider: Kathlyn Sacramento A    Ordering User: Raelene Bott, Jacksen Isip L

## 2021-09-21 NOTE — Anesthesia Preprocedure Evaluation (Addendum)
Anesthesia Evaluation  Patient identified by MRN, date of birth, ID band Patient awake    Reviewed: Allergy & Precautions, NPO status , Patient's Chart, lab work & pertinent test results  History of Anesthesia Complications Negative for: history of anesthetic complications  Airway Mallampati: II  TM Distance: >3 FB Neck ROM: Full    Dental no notable dental hx. (+) Dental Advisory Given   Pulmonary former smoker,    Pulmonary exam normal        Cardiovascular hypertension, Pt. on medications and Pt. on home beta blockers + CAD  Normal cardiovascular exam  1. Left ventricular ejection fraction, by estimation, is 55 to 60%. The left ventricle has normal function. The left ventricle has no regional wall motion abnormalities. Left ventricular diastolic parameters were normal. The average left ventricular global longitudinal strain is - 15.9 %. 2. Right ventricular systolic function is normal. The right ventricular size is normal. There is normal pulmonary artery systolic pressure. 3. The mitral valve is normal in structure. Mild mitral valve regurgitation. No evidence of mitral stenosis. 4. The aortic valve is tricuspid. Aortic valve regurgitation is not visualized. Mild aortic valve sclerosis is present, with no evidence of aortic valve stenosis. 5. The inferior vena cava is normal in size with greater than 50% respiratory variability, suggesting right atrial pressure of 3 mmHg.   Neuro/Psych negative neurological ROS  negative psych ROS   GI/Hepatic Neg liver ROS,   Endo/Other  negative endocrine ROS  Renal/GU negative Renal ROS     Musculoskeletal  (+) Arthritis ,   Abdominal (+) + obese,   Peds  Hematology negative hematology ROS (+)   Anesthesia Other Findings   Reproductive/Obstetrics                            Anesthesia Physical  Anesthesia Plan  ASA: 2  Anesthesia Plan:  General   Post-op Pain Management:    Induction: Intravenous  PONV Risk Score and Plan: 3 and Ondansetron, Dexamethasone, Midazolam and Treatment may vary due to age or medical condition  Airway Management Planned: Oral ETT  Additional Equipment:   Intra-op Plan:   Post-operative Plan: Extubation in OR  Informed Consent: I have reviewed the patients History and Physical, chart, labs and discussed the procedure including the risks, benefits and alternatives for the proposed anesthesia with the patient or authorized representative who has indicated his/her understanding and acceptance.     Dental advisory given  Plan Discussed with: Anesthesiologist and CRNA  Anesthesia Plan Comments: (PAT note by Karoline Caldwell, PA-C: Follows with cardiology for history of moderate nonobstructive CAD. He underwent thorough evaluation in 2021 for atypical chest pain and palpitations. 2-week event monitor 1 episode of wide-complex tachycardia lasting only 4 beats and was likely due to SVT with aberrancy although slow nonsustained ventricular tachycardia cannot be excluded. He was also noted to have short runs of SVT the longest lasted 16 beats. Echocardiogram showed normal LV systolic function with mild mitral regurgitation and no significant pulmonary hypertension. Lexiscan Myoview showed fixed apical and inferior defect. The nuclear stress study was suboptimal and thus he underwent cardiac CTA in September which showed a calcium score of 857 with evidence of moderate stenosis in the proximal LAD as well as mild left circumflex disease. None of the lesions were significant by FFR.  He was last seen by Dr. Fletcher Anon 11/04/2020 and noted to be doing well at that time, denied any further chest pain.  Recommended continue aggressive medical therapy and follow-up in 1 year.  Preop labs reviewed, unremarkable.  EKG 11/04/2020: NSR.  Rate 74.  LAFB.  Moderate voltage criteria for LVH, may be normal  variant.  Cardiac CTA 05/01/20 IMPRESSION: 1. High coronary calcium score of 857. This was 91st percentile for age and sex matched control ([place]). 2. Normal coronary origin with right dominance. 3. Calcified and non calcified plaque in the proximal LAD causing moderate stenosis. 4. Calcified plaque in the proximal LCx and distal LM causing mild stenosis 5. CAD-RADS 3. Moderate stenosis. Consider symptom-guided anti-ischemic pharmacotherapy as well as risk factor modification per guideline directed care. Additional analysis with CT FFR will be submitted and reported separately. 1. Left Main: No significant stenosis. 2. LAD: No significant stenosis. FFR 0.9 proximally, 0.84 distally 3. LCX: No significant stenosis. FFR 0.91 4. RCA: No significant stenosis. FFR 0.9 IMPRESSION: 1. CT FFR analysis didn't show any significant stenosis.  Echo8/30/2021 1. Left ventricular ejection fraction, by estimation, is 55 to 60%. The  left ventricle has normal function. The left ventricle has no regional  wall motion abnormalities. Left ventricular diastolic parameters were  normal. The average left ventricular  global longitudinal strain is -15.9 %.  2. Right ventricular systolic function is normal. The right ventricular  size is normal. There is normal pulmonary artery systolic pressure.  3. The mitral valve is normal in structure. Mild mitral valve  regurgitation. No evidence of mitral stenosis.  4. The aortic valve is tricuspid. Aortic valve regurgitation is not  visualized. Mild aortic valve sclerosis is present, with no evidence of  aortic valve stenosis.  5. The inferior vena cava is normal in size with greater than 50%  respiratory variability, suggesting right atrial pressure of 3 mmHg.   Cardiac monitoring/ [facility] 03/28/20 Normal sinus rhythm with an average heart rate of 71 bpm. 1 run of wide-complex tachycardia lasting only 4 beats likely SVT with aberrancy although slow  nonsustained ventricular tachycardia cannot be excluded. 6 episodes of SVT noted the longest lasted 16 beats. Rare PACs and rare PVCs. Most triggered events did not correlate with arrhythmia.   )       Anesthesia Quick Evaluation

## 2021-09-21 NOTE — Telephone Encounter (Signed)
°*  STAT* If patient is at the pharmacy, call can be transferred to refill team.   1. Which medications need to be refilled? (please list name of each medication and dose if known) atorvastatin 20 MG daily   2. Which pharmacy/location (including street and city if local pharmacy) is medication to be sent to? [facility] pharmacy   3. Do they need a 30 day or 90 day supply? 90 day

## 2021-09-22 ENCOUNTER — Inpatient Hospital Stay (HOSPITAL_COMMUNITY): Payer: Medicare Other

## 2021-09-22 ENCOUNTER — Other Ambulatory Visit: Payer: Self-pay

## 2021-09-22 ENCOUNTER — Encounter (HOSPITAL_COMMUNITY): Payer: Self-pay | Admitting: Neurological Surgery

## 2021-09-22 ENCOUNTER — Inpatient Hospital Stay (HOSPITAL_COMMUNITY)
Admission: RE | Admit: 2021-09-22 | Discharge: 2021-09-23 | DRG: 460 | Disposition: A | Discharge status: Home / Self Care | Payer: Medicare Other | Attending: Neurological Surgery | Admitting: Neurological Surgery

## 2021-09-22 ENCOUNTER — Inpatient Hospital Stay (HOSPITAL_COMMUNITY): Payer: Medicare Other | Admitting: Anesthesiology

## 2021-09-22 ENCOUNTER — Encounter (HOSPITAL_COMMUNITY)
Admission: RE | Disposition: A | Discharge status: Home / Self Care | Payer: Self-pay | Source: Home / Self Care | Attending: Neurological Surgery

## 2021-09-22 ENCOUNTER — Inpatient Hospital Stay (HOSPITAL_COMMUNITY): Payer: Medicare Other | Admitting: Physician Assistant

## 2021-09-22 DIAGNOSIS — Z20822 Contact with and (suspected) exposure to covid-19: Secondary | ICD-10-CM | POA: Diagnosis present

## 2021-09-22 DIAGNOSIS — Z8249 Family history of ischemic heart disease and other diseases of the circulatory system: Secondary | ICD-10-CM

## 2021-09-22 DIAGNOSIS — E669 Obesity, unspecified: Secondary | ICD-10-CM | POA: Diagnosis present

## 2021-09-22 DIAGNOSIS — Z8042 Family history of malignant neoplasm of prostate: Secondary | ICD-10-CM

## 2021-09-22 DIAGNOSIS — M960 Pseudarthrosis after fusion or arthrodesis: Principal | ICD-10-CM | POA: Diagnosis present

## 2021-09-22 DIAGNOSIS — Z87891 Personal history of nicotine dependence: Secondary | ICD-10-CM | POA: Diagnosis not present

## 2021-09-22 DIAGNOSIS — Z808 Family history of malignant neoplasm of other organs or systems: Secondary | ICD-10-CM

## 2021-09-22 DIAGNOSIS — Z419 Encounter for procedure for purposes other than remedying health state, unspecified: Secondary | ICD-10-CM

## 2021-09-22 DIAGNOSIS — M1990 Unspecified osteoarthritis, unspecified site: Secondary | ICD-10-CM | POA: Diagnosis present

## 2021-09-22 DIAGNOSIS — Z825 Family history of asthma and other chronic lower respiratory diseases: Secondary | ICD-10-CM

## 2021-09-22 DIAGNOSIS — Y838 Other surgical procedures as the cause of abnormal reaction of the patient, or of later complication, without mention of misadventure at the time of the procedure: Secondary | ICD-10-CM | POA: Diagnosis present

## 2021-09-22 DIAGNOSIS — Z85828 Personal history of other malignant neoplasm of skin: Secondary | ICD-10-CM | POA: Diagnosis not present

## 2021-09-22 DIAGNOSIS — Z818 Family history of other mental and behavioral disorders: Secondary | ICD-10-CM | POA: Diagnosis not present

## 2021-09-22 DIAGNOSIS — Z6830 Body mass index (BMI) 30.0-30.9, adult: Secondary | ICD-10-CM

## 2021-09-22 DIAGNOSIS — S32009K Unspecified fracture of unspecified lumbar vertebra, subsequent encounter for fracture with nonunion: Secondary | ICD-10-CM | POA: Diagnosis present

## 2021-09-22 HISTORY — PX: OTHER SURGICAL HISTORY: SHX169

## 2021-09-22 SURGERY — POSTERIOR LUMBAR FUSION 1 LEVEL
Anesthesia: General | Site: Spine Lumbar

## 2021-09-22 MED ORDER — THROMBIN 5000 UNITS EX SOLR
CUTANEOUS | Status: AC
  Filled 2021-09-22: qty 5000

## 2021-09-22 MED ORDER — MIDAZOLAM HCL 2 MG/2ML IJ SOLN
INTRAMUSCULAR | Status: AC
  Filled 2021-09-22: qty 2

## 2021-09-22 MED ORDER — PROPOFOL 10 MG/ML IV BOLUS
INTRAVENOUS | Status: DC | PRN
Start: 2021-09-22 — End: 2021-09-22
  Administered 2021-09-22: 130 mg via INTRAVENOUS

## 2021-09-22 MED ORDER — ONDANSETRON HCL 4 MG/2ML IJ SOLN: 4.0000 mg | Freq: Four times a day (QID) | INTRAMUSCULAR | Status: DC | PRN

## 2021-09-22 MED ORDER — ALBUMIN HUMAN 5 % IV SOLN
INTRAVENOUS | Status: AC
  Filled 2021-09-22: qty 250

## 2021-09-22 MED ORDER — SODIUM CHLORIDE 0.9 % IV SOLN
250.0000 mL | INTRAVENOUS | Status: DC
  Administered 2021-09-22: 250 mL via INTRAVENOUS

## 2021-09-22 MED ORDER — BUPIVACAINE HCL (PF) 0.5 % IJ SOLN
INTRAMUSCULAR | Status: AC
  Filled 2021-09-22: qty 30

## 2021-09-22 MED ORDER — CHLORHEXIDINE GLUCONATE 0.12 % MT SOLN
15.0000 mL | Freq: Once | OROMUCOSAL | Status: AC
  Administered 2021-09-22: 15 mL via OROMUCOSAL
  Filled 2021-09-22: qty 15

## 2021-09-22 MED ORDER — ACETAMINOPHEN 500 MG PO TABS
1000.0000 mg | ORAL_TABLET | Freq: Once | ORAL | Status: DC
  Filled 2021-09-22: qty 2

## 2021-09-22 MED ORDER — PROPOFOL 10 MG/ML IV BOLUS
INTRAVENOUS | Status: AC
  Filled 2021-09-22: qty 20

## 2021-09-22 MED ORDER — CHLORHEXIDINE GLUCONATE CLOTH 2 % EX PADS: 6.0000 | MEDICATED_PAD | Freq: Once | CUTANEOUS | Status: DC

## 2021-09-22 MED ORDER — ORAL CARE MOUTH RINSE: 15.0000 mL | Freq: Once | OROMUCOSAL | Status: AC

## 2021-09-22 MED ORDER — HYDROMORPHONE HCL 1 MG/ML IJ SOLN: 1.0000 mg | INTRAMUSCULAR | Status: DC | PRN

## 2021-09-22 MED ORDER — LIDOCAINE 2% (20 MG/ML) 5 ML SYRINGE
INTRAMUSCULAR | Status: DC | PRN
  Administered 2021-09-22: 100 mg via INTRAVENOUS

## 2021-09-22 MED ORDER — CEFAZOLIN SODIUM-DEXTROSE 2-4 GM/100ML-% IV SOLN
2.0000 g | Freq: Three times a day (TID) | INTRAVENOUS | Status: AC
  Administered 2021-09-22 – 2021-09-23 (×2): 2 g via INTRAVENOUS
  Filled 2021-09-22 (×2): qty 100

## 2021-09-22 MED ORDER — FENTANYL CITRATE (PF) 100 MCG/2ML IJ SOLN
25.0000 ug | INTRAMUSCULAR | Status: DC | PRN
  Administered 2021-09-22: 25 ug via INTRAVENOUS

## 2021-09-22 MED ORDER — LOSARTAN POTASSIUM 50 MG PO TABS: 50.0000 mg | ORAL_TABLET | Freq: Every day | ORAL | Status: DC

## 2021-09-22 MED ORDER — LACTATED RINGERS IV SOLN: INTRAVENOUS | Status: DC

## 2021-09-22 MED ORDER — 0.9 % SODIUM CHLORIDE (POUR BTL) OPTIME
TOPICAL | Status: DC | PRN
  Administered 2021-09-22: 1000 mL

## 2021-09-22 MED ORDER — PHENOL 1.4 % MT LIQD: 1.0000 | OROMUCOSAL | Status: DC | PRN

## 2021-09-22 MED ORDER — FENTANYL CITRATE (PF) 250 MCG/5ML IJ SOLN
INTRAMUSCULAR | Status: AC
  Filled 2021-09-22: qty 5

## 2021-09-22 MED ORDER — ONDANSETRON HCL 4 MG PO TABS: 4.0000 mg | ORAL_TABLET | Freq: Four times a day (QID) | ORAL | Status: DC | PRN

## 2021-09-22 MED ORDER — MIDAZOLAM HCL 2 MG/2ML IJ SOLN
INTRAMUSCULAR | Status: DC | PRN
  Administered 2021-09-22: 2 mg via INTRAVENOUS

## 2021-09-22 MED ORDER — LIDOCAINE-EPINEPHRINE 1 %-1:100000 IJ SOLN
INTRAMUSCULAR | Status: AC
  Filled 2021-09-22: qty 1

## 2021-09-22 MED ORDER — POLYETHYLENE GLYCOL 3350 17 G PO PACK: 17.0000 g | PACK | Freq: Every day | ORAL | Status: DC | PRN

## 2021-09-22 MED ORDER — OXYCODONE HCL 5 MG PO TABS
10.0000 mg | ORAL_TABLET | ORAL | Status: DC | PRN
  Administered 2021-09-22 – 2021-09-23 (×5): 10 mg via ORAL
  Filled 2021-09-22 (×5): qty 2

## 2021-09-22 MED ORDER — ACETAMINOPHEN 650 MG RE SUPP: 650.0000 mg | RECTAL | Status: DC | PRN

## 2021-09-22 MED ORDER — OXYCODONE HCL 5 MG PO TABS: 5.0000 mg | ORAL_TABLET | ORAL | Status: DC | PRN

## 2021-09-22 MED ORDER — ONDANSETRON HCL 4 MG/2ML IJ SOLN
INTRAMUSCULAR | Status: DC | PRN
  Administered 2021-09-22: 4 mg via INTRAVENOUS

## 2021-09-22 MED ORDER — CYCLOBENZAPRINE HCL 10 MG PO TABS
10.0000 mg | ORAL_TABLET | Freq: Three times a day (TID) | ORAL | Status: DC | PRN
  Administered 2021-09-22: 10 mg via ORAL
  Filled 2021-09-22: qty 1

## 2021-09-22 MED ORDER — ACETAMINOPHEN 325 MG PO TABS
650.0000 mg | ORAL_TABLET | ORAL | Status: DC | PRN
  Administered 2021-09-22 – 2021-09-23 (×3): 650 mg via ORAL
  Filled 2021-09-22 (×3): qty 2

## 2021-09-22 MED ORDER — DEXAMETHASONE SODIUM PHOSPHATE 10 MG/ML IJ SOLN
INTRAMUSCULAR | Status: DC | PRN
  Administered 2021-09-22: 10 mg via INTRAVENOUS

## 2021-09-22 MED ORDER — PROMETHAZINE HCL 25 MG/ML IJ SOLN: 6.2500 mg | INTRAMUSCULAR | Status: DC | PRN

## 2021-09-22 MED ORDER — FENTANYL CITRATE (PF) 100 MCG/2ML IJ SOLN
INTRAMUSCULAR | Status: AC
  Filled 2021-09-22: qty 2

## 2021-09-22 MED ORDER — ADULT MULTIVITAMIN W/MINERALS CH: 1.0000 | ORAL_TABLET | Freq: Every day | ORAL | Status: DC

## 2021-09-22 MED ORDER — SUGAMMADEX SODIUM 200 MG/2ML IV SOLN
INTRAVENOUS | Status: DC | PRN
  Administered 2021-09-22: 200 mg via INTRAVENOUS

## 2021-09-22 MED ORDER — DOCUSATE SODIUM 100 MG PO CAPS
100.0000 mg | ORAL_CAPSULE | Freq: Two times a day (BID) | ORAL | Status: DC
  Administered 2021-09-22 (×2): 100 mg via ORAL
  Filled 2021-09-22 (×2): qty 1

## 2021-09-22 MED ORDER — ALBUMIN HUMAN 5 % IV SOLN
12.5000 g | Freq: Once | INTRAVENOUS | Status: AC
  Administered 2021-09-22: 12.5 g via INTRAVENOUS

## 2021-09-22 MED ORDER — ACETAMINOPHEN 10 MG/ML IV SOLN
1000.0000 mg | Freq: Once | INTRAVENOUS | Status: AC
  Administered 2021-09-22: 1000 mg via INTRAVENOUS

## 2021-09-22 MED ORDER — PHENYLEPHRINE HCL-NACL 20-0.9 MG/250ML-% IV SOLN
INTRAVENOUS | Status: DC | PRN
  Administered 2021-09-22: 30 ug/min via INTRAVENOUS

## 2021-09-22 MED ORDER — CEFAZOLIN SODIUM-DEXTROSE 2-4 GM/100ML-% IV SOLN
2.0000 g | INTRAVENOUS | Status: AC
  Administered 2021-09-22: 2 g via INTRAVENOUS
  Filled 2021-09-22: qty 100

## 2021-09-22 MED ORDER — ACETAMINOPHEN 10 MG/ML IV SOLN
INTRAVENOUS | Status: AC
  Filled 2021-09-22: qty 100

## 2021-09-22 MED ORDER — FENTANYL CITRATE (PF) 250 MCG/5ML IJ SOLN
INTRAMUSCULAR | Status: DC | PRN
  Administered 2021-09-22: 100 ug via INTRAVENOUS
  Administered 2021-09-22 (×3): 50 ug via INTRAVENOUS

## 2021-09-22 MED ORDER — ALBUMIN HUMAN 5 % IV SOLN: INTRAVENOUS | Status: DC | PRN

## 2021-09-22 MED ORDER — THROMBIN 5000 UNITS EX SOLR
OROMUCOSAL | Status: DC | PRN
  Administered 2021-09-22: 5 mL

## 2021-09-22 MED ORDER — LIDOCAINE-EPINEPHRINE 1 %-1:100000 IJ SOLN
INTRAMUSCULAR | Status: DC | PRN
Start: 2021-09-22 — End: 2021-09-22
  Administered 2021-09-22: 5 mL

## 2021-09-22 MED ORDER — CELECOXIB 200 MG PO CAPS
200.0000 mg | ORAL_CAPSULE | Freq: Once | ORAL | Status: AC
  Administered 2021-09-22: 200 mg via ORAL
  Filled 2021-09-22: qty 1

## 2021-09-22 MED ORDER — PHENYLEPHRINE 40 MCG/ML (10ML) SYRINGE FOR IV PUSH (FOR BLOOD PRESSURE SUPPORT)
PREFILLED_SYRINGE | INTRAVENOUS | Status: DC | PRN
Start: 2021-09-22 — End: 2021-09-22
  Administered 2021-09-22 (×2): 120 ug via INTRAVENOUS

## 2021-09-22 MED ORDER — AMISULPRIDE (ANTIEMETIC) 5 MG/2ML IV SOLN: 10.0000 mg | Freq: Once | INTRAVENOUS | Status: DC | PRN

## 2021-09-22 MED ORDER — BUPIVACAINE HCL (PF) 0.5 % IJ SOLN
INTRAMUSCULAR | Status: DC | PRN
  Administered 2021-09-22: 5 mL

## 2021-09-22 MED ORDER — MENTHOL 3 MG MT LOZG
1.0000 | LOZENGE | OROMUCOSAL | Status: DC | PRN
  Filled 2021-09-22: qty 9

## 2021-09-22 MED ORDER — SODIUM CHLORIDE 0.9% FLUSH: 3.0000 mL | Freq: Two times a day (BID) | INTRAVENOUS | Status: DC

## 2021-09-22 MED ORDER — ROCURONIUM BROMIDE 10 MG/ML (PF) SYRINGE
PREFILLED_SYRINGE | INTRAVENOUS | Status: DC | PRN
  Administered 2021-09-22: 70 mg via INTRAVENOUS
  Administered 2021-09-22: 100 mg via INTRAVENOUS
  Administered 2021-09-22: 30 mg via INTRAVENOUS

## 2021-09-22 MED ORDER — ATORVASTATIN CALCIUM 10 MG PO TABS
20.0000 mg | ORAL_TABLET | Freq: Every day | ORAL | Status: DC
  Administered 2021-09-22: 20 mg via ORAL
  Filled 2021-09-22: qty 2

## 2021-09-22 MED ORDER — SODIUM CHLORIDE 0.9% FLUSH: 3.0000 mL | INTRAVENOUS | Status: DC | PRN

## 2021-09-22 MED ORDER — CARVEDILOL 3.125 MG PO TABS
3.1250 mg | ORAL_TABLET | Freq: Two times a day (BID) | ORAL | Status: DC
  Administered 2021-09-22: 3.125 mg via ORAL
  Filled 2021-09-22: qty 1

## 2021-09-22 SURGICAL SUPPLY — 68 items
BAG COUNTER SPONGE SURGICOUNT (BAG) ×2 IMPLANT
BASKET BONE COLLECTION (BASKET) ×2 IMPLANT
BENZOIN TINCTURE PRP APPL 2/3 (GAUZE/BANDAGES/DRESSINGS) IMPLANT
BLADE CLIPPER SURG (BLADE) IMPLANT
BLADE SURG 11 STRL SS (BLADE) ×2 IMPLANT
BUR MATCHSTICK NEURO 3.0 LAGG (BURR) ×2 IMPLANT
BUR PRECISION FLUTE 5.0 (BURR) ×2 IMPLANT
CANISTER SUCT 3000ML PPV (MISCELLANEOUS) ×2 IMPLANT
CNTNR URN SCR LID CUP LEK RST (MISCELLANEOUS) ×1 IMPLANT
CONT SPEC 4OZ STRL OR WHT (MISCELLANEOUS) ×1
COVER BACK TABLE 60X90IN (DRAPES) ×2 IMPLANT
DECANTER SPIKE VIAL GLASS SM (MISCELLANEOUS) ×2 IMPLANT
DERMABOND ADVANCED (GAUZE/BANDAGES/DRESSINGS) ×1
DERMABOND ADVANCED .7 DNX12 (GAUZE/BANDAGES/DRESSINGS) ×1 IMPLANT
DRAPE C-ARM 42X72 X-RAY (DRAPES) IMPLANT
DRAPE C-ARMOR (DRAPES) IMPLANT
DRAPE LAPAROTOMY 100X72X124 (DRAPES) ×2 IMPLANT
DRAPE SURG 17X23 STRL (DRAPES) ×2 IMPLANT
DURAPREP 26ML APPLICATOR (WOUND CARE) ×2 IMPLANT
ELECT REM PT RETURN 9FT ADLT (ELECTROSURGICAL) ×2
ELECTRODE REM PT RTRN 9FT ADLT (ELECTROSURGICAL) ×1 IMPLANT
GAUZE 4X4 16PLY ~~LOC~~+RFID DBL (SPONGE) IMPLANT
GAUZE SPONGE 4X4 12PLY STRL (GAUZE/BANDAGES/DRESSINGS) IMPLANT
GLOVE EXAM NITRILE LRG STRL (GLOVE) IMPLANT
GLOVE EXAM NITRILE XL STR (GLOVE) IMPLANT
GLOVE EXAM NITRILE XS STR PU (GLOVE) IMPLANT
GLOVE SURG LTX SZ7.5 (GLOVE) ×4 IMPLANT
GLOVE SURG POLYISO LF SZ7 (GLOVE) ×4 IMPLANT
GLOVE SURG UNDER POLY LF SZ7.5 (GLOVE) ×7 IMPLANT
GOWN STRL REUS W/ TWL LRG LVL3 (GOWN DISPOSABLE) ×4 IMPLANT
GOWN STRL REUS W/ TWL XL LVL3 (GOWN DISPOSABLE) IMPLANT
GOWN STRL REUS W/TWL 2XL LVL3 (GOWN DISPOSABLE) IMPLANT
GOWN STRL REUS W/TWL LRG LVL3 (GOWN DISPOSABLE) ×4
GOWN STRL REUS W/TWL XL LVL3 (GOWN DISPOSABLE) ×2
GUIDEWIRE BLUNT NT 450 (WIRE) ×4 IMPLANT
HEMOSTAT POWDER KIT SURGIFOAM (HEMOSTASIS) ×2 IMPLANT
KIT BASIN OR (CUSTOM PROCEDURE TRAY) ×2 IMPLANT
KIT INFUSE MEDIUM (Orthopedic Implant) ×1 IMPLANT
KIT POSITION SURG JACKSON T1 (MISCELLANEOUS) ×2 IMPLANT
KIT TURNOVER KIT B (KITS) ×2 IMPLANT
MILL MEDIUM DISP (BLADE) ×2 IMPLANT
NDL HYPO 18GX1.5 BLUNT FILL (NEEDLE) IMPLANT
NDL SPNL 18GX3.5 QUINCKE PK (NEEDLE) IMPLANT
NEEDLE HYPO 18GX1.5 BLUNT FILL (NEEDLE) IMPLANT
NEEDLE HYPO 22GX1.5 SAFETY (NEEDLE) ×2 IMPLANT
NEEDLE SPNL 18GX3.5 QUINCKE PK (NEEDLE) IMPLANT
NS IRRIG 1000ML POUR BTL (IV SOLUTION) ×2 IMPLANT
PACK LAMINECTOMY NEURO (CUSTOM PROCEDURE TRAY) ×2 IMPLANT
PAD ARMBOARD 7.5X6 YLW CONV (MISCELLANEOUS) ×6 IMPLANT
PUTTY GRAFTON DBF 6CC W/DELIVE (Putty) ×1 IMPLANT
ROD PED SOLERA 5.5X60 (Rod) ×2 IMPLANT
SCREW CANN MAS 7.5X70 (Screw) ×2 IMPLANT
SCREW POST SOLERA 7.5X35 F/5.5 (Screw) ×2 IMPLANT
SCREW SET SOLERA (Screw) ×6 IMPLANT
SCREW SET SOLERA TI5.5 (Screw) IMPLANT
SCREW SOLERA 7.5X40 (Screw) ×2 IMPLANT
SPONGE SURGIFOAM ABS GEL 100 (HEMOSTASIS) IMPLANT
SPONGE T-LAP 4X18 ~~LOC~~+RFID (SPONGE) IMPLANT
STRIP CLOSURE SKIN 1/2X4 (GAUZE/BANDAGES/DRESSINGS) IMPLANT
SUT MNCRL AB 3-0 PS2 18 (SUTURE) ×2 IMPLANT
SUT VIC AB 0 CT1 18XCR BRD8 (SUTURE) ×1 IMPLANT
SUT VIC AB 0 CT1 8-18 (SUTURE) ×2
SUT VIC AB 2-0 CP2 18 (SUTURE) ×2 IMPLANT
SYR 3ML LL SCALE MARK (SYRINGE) IMPLANT
TOWEL GREEN STERILE (TOWEL DISPOSABLE) ×2 IMPLANT
TOWEL GREEN STERILE FF (TOWEL DISPOSABLE) ×2 IMPLANT
TRAY FOLEY MTR SLVR 16FR STAT (SET/KITS/TRAYS/PACK) ×2 IMPLANT
WATER STERILE IRR 1000ML POUR (IV SOLUTION) ×2 IMPLANT

## 2021-09-22 NOTE — Anesthesia Procedure Notes (Signed)
Procedure Name: Intubation Date/Time: 09/22/2021 8:00 AM Performed by: Griffin Dakin, CRNA Pre-anesthesia Checklist: Patient identified, Emergency Drugs available, Suction available and Patient being monitored Patient Re-evaluated:Patient Re-evaluated prior to induction Oxygen Delivery Method: Circle system utilized Preoxygenation: Pre-oxygenation with 100% oxygen Induction Type: IV induction Ventilation: Mask ventilation without difficulty and Oral airway inserted - appropriate to patient size Laryngoscope Size: Mac and 4 Grade View: Grade I Tube type: Oral Number of attempts: 1 Airway Equipment and Method: Stylet and Oral airway Placement Confirmation: ETT inserted through vocal cords under direct vision, positive ETCO2 and breath sounds checked- equal and bilateral Secured at: 24 cm Tube secured with: Tape Dental Injury: Teeth and Oropharynx as per pre-operative assessment

## 2021-09-22 NOTE — H&P (Signed)
Surgical H&P Update   HPI: 66 y.o. with a history of prior L5-S1 MIS TLIF, presented with recurrence of his back pain, CT showed psuedoarthrosis, now here for revision. No changes in health since they were last seen. Still having the above and wishes to proceed with surgery.   PMHx:      Past Medical History:  Diagnosis Date   Allergic rhinitis, cause unspecified     Allergy     Bronchitis     Cancer ([place]) 2020    skin cancer   Hyperlipidemia     Hypertension     Metabolic syndrome     Obesity, unspecified     Osteoarthritis     Osteoarthrosis, unspecified whether generalized or localized, unspecified site     Pneumonia     Right upper quadrant pain      FamHx:       Family History  Problem Relation Age of Onset   Heart disease Mother     Heart attack Mother     COPD Mother     Heart disease Father     Hypertension Father     COPD Father     Cancer Father           Prostate and Skin   COPD Sister     Suicidality Brother     Heart attack Brother 54        2014   CAD Brother     Hypertension Daughter     Depression Daughter      SocHx:  reports that he quit smoking about 19 years ago. His smoking use included cigarettes. He started smoking about 34 years ago. He has a 15.00 pack-year smoking history. He has never used smokeless tobacco. He reports current alcohol use. He reports that he does not use drugs.   Physical Exam: Strength 5/5 x4 and SILTx4    Assesment/Plan: 66 y.o. man with prior L5-S1 MIS TLIF for isthmic spondy w/ pseudoarthrosis, here for revision of L5-S1 construct, extension to the pelvis, and posterolateral fusion. Risks, benefits, and alternatives discussed and the patient would like to continue with surgery.   -OR today -3C post-op   Judith Part, MD 09/22/21 7:42 AM

## 2021-09-22 NOTE — Anesthesia Postprocedure Evaluation (Signed)
Anesthesia Post Note  Patient: Richard Mueller [facility] III  Procedure(s) Performed: Open Lumbar Five-Sacral One replacement of loose screws, Lumbar Five-Pelvis posterolateral instrumented fusion (Spine Lumbar)     Patient location during evaluation: PACU Anesthesia Type: General Level of consciousness: sedated Pain management: pain level controlled Vital Signs Assessment: post-procedure vital signs reviewed and stable Respiratory status: spontaneous breathing and respiratory function stable Cardiovascular status: stable Postop Assessment: no apparent nausea or vomiting Anesthetic complications: no   No notable events documented.  Last Vitals:  Vitals:   09/22/21 1326 09/22/21 1332  BP: (!) 88/65 95/69  Pulse: 75   Resp: 10 10  Temp:    SpO2: 92%     Last Pain:  Vitals:   09/22/21 1326  TempSrc:   PainSc: Asleep                 Madex Seals DANIEL

## 2021-09-22 NOTE — Op Note (Deleted)
Surgical H&P Update  HPI: 66 y.o. with a history of prior L5-S1 MIS TLIF, presented with recurrence of his back pain, CT showed psuedoarthrosis, now here for revision. No changes in health since they were last seen. Still having the above and wishes to proceed with surgery.  PMHx:  Past Medical History:  Diagnosis Date   Allergic rhinitis, cause unspecified    Allergy    Bronchitis    Cancer ([place]) 2020   skin cancer   Hyperlipidemia    Hypertension    Metabolic syndrome    Obesity, unspecified    Osteoarthritis    Osteoarthrosis, unspecified whether generalized or localized, unspecified site    Pneumonia    Right upper quadrant pain    FamHx:  Family History  Problem Relation Age of Onset   Heart disease Mother    Heart attack Mother    COPD Mother    Heart disease Father    Hypertension Father    COPD Father    Cancer Father         Prostate and Skin   COPD Sister    Suicidality Brother    Heart attack Brother 54       2014   CAD Brother    Hypertension Daughter    Depression Daughter    SocHx:  reports that he quit smoking about 19 years ago. His smoking use included cigarettes. He started smoking about 34 years ago. He has a 15.00 pack-year smoking history. He has never used smokeless tobacco. He reports current alcohol use. He reports that he does not use drugs.  Physical Exam: Strength 5/5 x4 and SILTx4   Assesment/Plan: 66 y.o. man with prior L5-S1 MIS TLIF for isthmic spondy w/ pseudoarthrosis, here for revision of L5-S1 construct, extension to the pelvis, and posterolateral fusion. Risks, benefits, and alternatives discussed and the patient would like to continue with surgery.  -OR today -3C post-op  Judith Part, MD 09/22/21 7:42 AM

## 2021-09-22 NOTE — Transfer of Care (Signed)
Immediate Anesthesia Transfer of Care Note  Patient: Richard Mueller [facility] III  Procedure(s) Performed: Open Lumbar Five-Sacral One replacement of loose screws, Lumbar Five-Pelvis posterolateral instrumented fusion (Spine Lumbar)  Patient Location: PACU  Anesthesia Type:General  Level of Consciousness: awake, alert  and oriented  Airway & Oxygen Therapy: Patient Spontanous Breathing  Post-op Assessment: Report given to RN and Post -op Vital signs reviewed and stable  Post vital signs: Reviewed and stable  Last Vitals:  Vitals Value Taken Time  BP 92/70 09/22/21 1159  Temp    Pulse 77 09/22/21 1203  Resp 10 09/22/21 1203  SpO2 95 % 09/22/21 1203  Vitals shown include unvalidated device data.  Last Pain:  Vitals:   09/22/21 0635  TempSrc:   PainSc: 4       Patients Stated Pain Goal: 2 (36/68/15 9470)  Complications: No notable events documented.

## 2021-09-22 NOTE — Op Note (Signed)
PATIENT: Richard Mueller  DAY OF SURGERY: 09/22/21   PRE-OPERATIVE DIAGNOSIS:  L5-S1 pseudoarthrosis   POST-OPERATIVE DIAGNOSIS:  Same   PROCEDURE:  Replacement of L5-S1 pedicle screws, placement of bilateral S2AI screws, L5-S1-pelvis posterolateral instrumented fusion   SURGEON:  Surgeon(s) and Role:    Arel Tippen, Joyice Faster, MD - Primary    Consuella Lose, MD - Assisting   ANESTHESIA: ETGA   BRIEF HISTORY: This is a 66 year old man in whom I previously performed an L5-S1 MIS TLIF who presented with a symptomatic pseudoarthrosis. I therefore recommended revision of the construct and extension to the pelvis. This was discussed with the patient as well as risks, benefits, and alternatives and wished to proceed with surgery.   OPERATIVE DETAIL: The patient was taken to the operating room and anesthesia was induced by the anesthesia team. They were placed on the OR table in the prone position with padding of all pressure points. A formal time out was performed with two patient identifiers and confirmed the operative site. The operative site was marked, hair was clipped with surgical clippers, the area was then prepped and draped in a sterile fashion. The patient's prior incisions were used bilaterally in a Wiltse type approach. They were opened and soft tissues were dissected to expose the screws / rods / facets out to the TPs bilaterally.   The rods and screws were removed and K-wires were placed in the holes and secured to allow for completion of dissection of the posterolateral fusion surfaces out to the Tps as well as identify and place the S2AI screws. Fluoroscopy was used to guide placement of bilateral S2AI screws with a combination of AP/lateral/'teardrop' views. After tapping and palpating the tracts, screws were placed bilaterally. The K-wires were used to replace the prior 6.66mm screws at L4 and L5 bilaterally with 7.13mm diameter screws, after tapping first. The bone was then  decorticated over the facets and out to the TPs, the wound was irrigated and hemostasis was confirmed, DMB was placed along the fusion surfaces, and BMP was placed bilaterally.   The rods were then placed bilaterally and secured, then final tightened according to manufacturer torque specifications.  All instrument and sponge counts were correct, the incision was then closed in layers. The patient was then returned to anesthesia for emergence. No apparent complications at the completion of the procedure.   EBL:  57mL   DRAINS: none   SPECIMENS: none   Richard Part, MD 09/22/21 11:41 AM

## 2021-09-22 NOTE — TOC Progression Note (Signed)
Transition of Care [facility]) - Progression Note    Patient Details  Name: Sylvestre Rathgeber III MRN: [id number] Date of Birth: 05-02-1956  Transition of Care [facility]) CM/SW Contact  Emric Kowalewski, Edson Snowball, RN Phone Number: 09/22/2021, 3:15 PM  Clinical Narrative:     Thayer Headings with [facility] received referral for home health from Dr Colleen Can office.  She will need home health orders and face to face if home health needed.  3C staff will provide any needed DME.   Transition of Care [facility]) Screening Note   Patient Details  Name: Javani Spratt [facility] III Date of Birth: 15-Jan-1956   Transition of Care Department [facility]) has reviewed patient and no TOC needs have been identified at this time. We will continue to monitor patient advancement through interdisciplinary progression rounds. If new patient transition needs arise, please place a TOC consult.         Expected Discharge Plan and Services                                                 Social Determinants of Health (SDOH) Interventions    Readmission Risk Interventions No flowsheet data found.

## 2021-09-23 ENCOUNTER — Other Ambulatory Visit: Payer: Self-pay

## 2021-09-23 MED ORDER — OXYCODONE-ACETAMINOPHEN 5-325 MG PO TABS
1.0000 | ORAL_TABLET | ORAL | 0 refills | Status: DC | PRN
  Filled 2021-09-23: qty 20, 4d supply, fill #0

## 2021-09-23 MED ORDER — ASPIRIN 81 MG PO TABS: 81.0000 mg | ORAL_TABLET | Freq: Every day | ORAL | Status: AC

## 2021-09-23 NOTE — Discharge Summary (Signed)
Discharge Summary  Date of Admission: 09/22/2021  Date of Discharge: 09/23/21  Attending Physician: Emelda Brothers, MD  Hospital Course: Patient was admitted following an uncomplicated revision of an L5-S1 pseudoarthrosis. They were recovered in PACU and transferred to [facility]. Their preop symptoms were completely resolved, their hospital course was uncomplicated and the patient was discharged home on 09/23/21. They will follow up in clinic with me in clinic in 2 weeks.  Neurologic exam at discharge:  Strength 5/5 x4 and SILTx4   Discharge diagnosis: Lumbar pseudoarthrosis  Judith Part, MD 09/23/21 9:35 AM

## 2021-09-23 NOTE — Progress Notes (Signed)
Neurosurgery Service Progress Note  Subjective: No acute events overnight., no new complaints   Objective: Vitals:   09/22/21 1950 09/22/21 2318 09/23/21 0417 09/23/21 0743  BP: 94/66 (!) 106/54 (!) 103/55 (!) 126/56  Pulse: 78 76 60 74  Resp: 18 18 18 16   Temp: 98.2 F (36.8 C) 98.4 F (36.9 C) 98.1 F (36.7 C) 98.4 F (36.9 C)  TempSrc: Oral Oral Oral Oral  SpO2: 96% 93% 95% 96%  Weight:      Height:        Physical Exam: Strength 5/5 x4 and SILTx4  Assessment & Plan: 66 y.o. man s/p revision of L5-S1 pseudo, recovering well.  -discharge home today  Judith Part  09/23/21 9:32 AM

## 2021-09-23 NOTE — Evaluation (Signed)
Occupational Therapy Evaluation Patient Details Name: Richard Mueller MRN: [id number] DOB: 04-18-1956 Today's Date: 09/23/2021   History of Present Illness Richard Mueller is a 66 y.o. male who underwent revision surgery  replacement of L5-S1 pedicle screws, placement of bilateral S2AI screws, L5-S1-pelvis posterolateral instrumented fusion 1/31.  PMHx: back sx 2/22, cancer, HLD, HTN, Obesity, OA,   Clinical Impression   Richard Mueller is indep at baseline, he lives in a 1 level home with his wife and is familiar with back precautions and rehab process from his previous back surgery 09/2020. Pt verbalized great understanding of all precautions and compensatory techniques. He was supervision for all ADLs and functional mobility without AD with minimal cues for safety. Pt does not have further acute OT needs. Recommend d/c home without follow up OT.      Recommendations for follow up therapy are one component of a multi-disciplinary discharge planning process, led by the attending physician.  Recommendations may be updated based on patient status, additional functional criteria and insurance authorization.   Follow Up Recommendations  No OT follow up    Assistance Recommended at Discharge PRN  Patient can return home with the following A little help with walking and/or transfers;A little help with bathing/dressing/bathroom;Assist for transportation    Functional Status Assessment  Patient has had a recent decline in their functional status and demonstrates the ability to make significant improvements in function in a reasonable and predictable amount of time.  Equipment Recommendations  None recommended by OT    Recommendations for Other Services       Precautions / Restrictions Precautions Precautions: Fall;Back Precaution Booklet Issued: Yes (comment) Precaution Comments: reviewed during activity Restrictions Weight Bearing Restrictions: No      Mobility Bed  Mobility Overal bed mobility: Modified Independent             General bed mobility comments: great demonstration of log roll    Transfers Overall transfer level: Modified independent Equipment used: None                      Balance Overall balance assessment: Needs assistance Sitting-balance support: Feet supported Sitting balance-Leahy Scale: Good     Standing balance support: During functional activity, No upper extremity supported Standing balance-Leahy Scale: Good                             ADL either performed or assessed with clinical judgement   ADL Overall ADL's : Needs assistance/impaired     General ADL Comments: Pt was supervision for all ADLs with minimal verbal cues for compensatory techniques and to maintain back precautions.     Vision Baseline Vision/History: 1 Wears glasses;0 No visual deficits Ability to See in Adequate Light: 0 Adequate Patient Visual Report: No change from baseline Vision Assessment?: No apparent visual deficits            Pertinent Vitals/Pain Pain Assessment Pain Assessment: No/denies pain        Extremity/Trunk Assessment Upper Extremity Assessment Upper Extremity Assessment: Overall WFL for tasks assessed   Lower Extremity Assessment Lower Extremity Assessment: Overall WFL for tasks assessed   Cervical / Trunk Assessment Cervical / Trunk Assessment: Back Surgery   Communication Communication Communication: No difficulties   Cognition Arousal/Alertness: Awake/alert Behavior During Therapy: WFL for tasks assessed/performed Overall Cognitive Status: Within Functional Limits for tasks assessed         General Comments: great  understanding of back precautions     General Comments  VSS on RA, supportive wife present            Home Living Family/patient expects to be discharged to:: Private residence Living Arrangements: Spouse/significant other Available Help at Discharge:  Family;Available 24 hours/day Type of Home: House Home Access: Stairs to enter     Home Layout: One level     Bathroom Shower/Tub: Corporate investment banker: Standard     Home Equipment: None          Prior Functioning/Environment Prior Level of Function : Independent/Modified Independent;Driving             Mobility Comments: no AD ADLs Comments: indep        OT Problem List: Decreased range of motion;Decreased activity tolerance;Decreased safety awareness;Decreased knowledge of precautions         OT Goals(Current goals can be found in the care plan section) Acute Rehab OT Goals Patient Stated Goal: home OT Goal Formulation: All assessment and education complete, DC therapy   AM-PAC OT "6 Clicks" Daily Activity     Outcome Measure Help from another person eating meals?: None Help from another person taking care of personal grooming?: None Help from another person toileting, which includes using toliet, bedpan, or urinal?: A Little Help from another person bathing (including washing, rinsing, drying)?: A Little Help from another person to put on and taking off regular upper body clothing?: None Help from another person to put on and taking off regular lower body clothing?: A Little 6 Click Score: 21   End of Session Nurse Communication: Mobility status  Activity Tolerance: Patient tolerated treatment well Patient left: in bed;with call bell/phone within reach;with family/visitor present  OT Visit Diagnosis: Other abnormalities of gait and mobility (R26.89)                Time: 8756-4332 OT Time Calculation (min): 18 min Charges:  OT General Charges $OT Visit: 1 Visit OT Evaluation $OT Eval Low Complexity: 1 Low  Diera Wirkkala A Palmira Stickle 09/23/2021, 9:29 AM

## 2021-09-23 NOTE — Progress Notes (Signed)
Patient alert and oriented, mae's well, voiding adequate amount of urine, swallowing without difficulty, no c/o pain at time of discharge. Patient discharged home with family. Script and discharged instructions given to patient. Patient and family stated understanding of instructions given. Patient has an appointment with Dr. Ostergard in 2 weeks 

## 2021-09-23 NOTE — Evaluation (Signed)
Physical Therapy Evaluation and Discharge Patient Details Name: Richard Mueller MRN: [id number] DOB: 1956-08-16 Today's Date: 09/23/2021  History of Present Illness  Pt is a 66 y/o male who presents s/p revision surgery replacement of L5-S1 pedicle screws, placement of bilateral S2AI screws, L5-S1-pelvis posterolateral instrumented fusion 09/22/21.  PMH significant for back sx 2/22, cancer, HTN, OA.   Clinical Impression  Patient evaluated by Physical Therapy with no further acute PT needs identified. All education has been completed and the patient has no further questions. Pt was able to demonstrate transfers and ambulation with gross modified independence and no AD. Pt was educated on precautions, positioning recommendations, appropriate activity progression, and car transfer. See below for any follow-up Physical Therapy or equipment needs. PT is signing off. Thank you for this referral.        Recommendations for follow up therapy are one component of a multi-disciplinary discharge planning process, led by the attending physician.  Recommendations may be updated based on patient status, additional functional criteria and insurance authorization.  Follow Up Recommendations No PT follow up    Assistance Recommended at Discharge PRN  Patient can return home with the following  Assist for transportation;Help with stairs or ramp for entrance    Equipment Recommendations BSC/3in1  Recommendations for Other Services       Functional Status Assessment Patient has had a recent decline in their functional status and demonstrates the ability to make significant improvements in function in a reasonable and predictable amount of time.     Precautions / Restrictions Precautions Precautions: Fall;Back Precaution Booklet Issued: Yes (comment) Precaution Comments: Reviewed handout and pt was cued for precautions during functional mobility. Restrictions Weight Bearing Restrictions: No       Mobility  Bed Mobility Overal bed mobility: Modified Independent             General bed mobility comments: HOB flat and rails lowered to simulate home environment. No assist required.    Transfers Overall transfer level: Modified independent Equipment used: None               General transfer comment: VC's for improved posture with power-up to full stand. No assist required.    Ambulation/Gait Ambulation/Gait assistance: Modified independent (Device/Increase time) Gait Distance (Feet): 500 Feet Assistive device: None Gait Pattern/deviations: Step-through pattern, Decreased stride length, Trunk flexed Gait velocity: Decreased Gait velocity interpretation: 1.31 - 2.62 ft/sec, indicative of limited community ambulator   General Gait Details: Pt with good posture and steady gait. Mildly decreased gait speed.  Stairs Stairs:  (Pt declined to practice)          Wheelchair Mobility    Modified Rankin (Stroke Patients Only)       Balance Overall balance assessment: Needs assistance Sitting-balance support: Feet supported Sitting balance-Leahy Scale: Good     Standing balance support: During functional activity, No upper extremity supported Standing balance-Leahy Scale: Good                               Pertinent Vitals/Pain Pain Assessment Pain Assessment: Faces Faces Pain Scale: Hurts a little bit Pain Location: incision site Pain Descriptors / Indicators: Operative site guarding Pain Intervention(s): Limited activity within patient's tolerance, Monitored during session, Repositioned    Home Living Family/patient expects to be discharged to:: Private residence Living Arrangements: Spouse/significant other Available Help at Discharge: Family;Available 24 hours/day Type of Home: House Home Access: Stairs to enter  Entrance Stairs-Number of Steps: 4   Home Layout: One level Home Equipment: None      Prior Function Prior Level  of Function : Independent/Modified Independent;Driving             Mobility Comments: no AD, increased time due to pain ADLs Comments: independent     Hand Dominance        Extremity/Trunk Assessment   Upper Extremity Assessment Upper Extremity Assessment: Overall WFL for tasks assessed    Lower Extremity Assessment Lower Extremity Assessment: Generalized weakness (Mild; consistent with pre-op diagnosis)    Cervical / Trunk Assessment Cervical / Trunk Assessment: Back Surgery  Communication   Communication: No difficulties  Cognition Arousal/Alertness: Awake/alert Behavior During Therapy: WFL for tasks assessed/performed Overall Cognitive Status: Within Functional Limits for tasks assessed                                          General Comments General comments (skin integrity, edema, etc.): VSS on RA, supportive wife present    Exercises     Assessment/Plan    PT Assessment Patient does not need any further PT services  PT Problem List         PT Treatment Interventions      PT Goals (Current goals can be found in the Care Plan section)  Acute Rehab PT Goals Patient Stated Goal: Home today PT Goal Formulation: All assessment and education complete, DC therapy    Frequency       Co-evaluation               AM-PAC PT "6 Clicks" Mobility  Outcome Measure Help needed turning from your back to your side while in a flat bed without using bedrails?: None Help needed moving from lying on your back to sitting on the side of a flat bed without using bedrails?: None Help needed moving to and from a bed to a chair (including a wheelchair)?: None Help needed standing up from a chair using your arms (e.g., wheelchair or bedside chair)?: None Help needed to walk in hospital room?: None Help needed climbing 3-5 steps with a railing? : None 6 Click Score: 24    End of Session   Activity Tolerance: Patient tolerated treatment  well Patient left: with call bell/phone within reach;in chair;with family/visitor present Nurse Communication: Mobility status PT Visit Diagnosis: Unsteadiness on feet (R26.81);Pain Pain - part of body:  (back)    Time: 7824-2353 PT Time Calculation (min) (ACUTE ONLY): 25 min   Charges:   PT Evaluation $PT Eval Low Complexity: 1 Low PT Treatments $Gait Training: 8-22 mins        Rolinda Roan, PT, DPT Acute Rehabilitation Services Pager: [phone] Office: [phone]   Thelma Comp 09/23/2021, 9:53 AM

## 2021-09-30 ENCOUNTER — Other Ambulatory Visit: Payer: Self-pay

## 2021-10-12 ENCOUNTER — Ambulatory Visit: Payer: Self-pay | Admitting: *Deleted

## 2021-10-12 NOTE — Telephone Encounter (Signed)
°  Chief Complaint: Rash Symptoms: Rash that moves around Frequency: Since Jan 12th Pertinent Negatives: Patient denies shortness of breath or hives Disposition: [] ED /[] Urgent Care (no appt availability in office) / [x] Appointment(In office/virtual)/ []  [facility]/ [] Home Care/ [] Refused Recommended Disposition /[] [facility]/ []  Follow-up with PCP Additional Notes:

## 2021-10-12 NOTE — Telephone Encounter (Signed)
Reason for Disposition  Mild widespread rash  Answer Assessment - Initial Assessment Questions 1. APPEARANCE of RASH: "Describe the rash." (e.g., spots, blisters, raised areas, skin peeling, scaly)     I had back surgery Jan. 31, 2023.    I had a f/u with dr already.   On 12th I woke up with this rash.   I thought it might be something about the back surgery.   My surgeon said my surgical site was fine.   He said it might be Shingles.     It has stopped itching.   It feels warm.   It's worse after I shower especially a hot shower.     No new soaps or diets.   This has been going on for a week.   It moves around.     My surgeon suggested I use cortisone cream which I'm doing and it helps but I want to know what's causing this.   In the beginning the rash was burning and hurting but no longer.  2. SIZE: "How big are the spots?" (e.g., tip of pen, eraser, coin; inches, centimeters)     *No Answer* 3. LOCATION: "Where is the rash located?"     It moves around 4. COLOR: "What color is the rash?" (Note: It is difficult to assess rash color in people with darker-colored skin. When this situation occurs, simply ask the caller to describe what they see.)     Red streaks  5. ONSET: "When did the rash begin?"     Jan 12th 6. FEVER: "Do you have a fever?" If Yes, ask: "What is your temperature, how was it measured, and when did it start?"     No 7. ITCHING: "Does the rash itch?" If Yes, ask: "How bad is the itch?" (Scale 1-10; or mild, moderate, severe)     Yes on and off 8. CAUSE: "What do you think is causing the rash?"     I have no idea.    9. MEDICINE FACTORS: "Have you started any new medicines within the last 2 weeks?" (e.g., antibiotics)      No 10. OTHER SYMPTOMS: "Do you have any other symptoms?" (e.g., dizziness, headache, sore throat, joint pain)       None 11. PREGNANCY: "Is there any chance you are pregnant?" "When was your last menstrual period?"       N/A  Protocols used: Rash or  Redness - [facility]

## 2021-10-14 ENCOUNTER — Other Ambulatory Visit: Payer: Self-pay

## 2021-10-14 ENCOUNTER — Encounter: Payer: Self-pay | Admitting: Internal Medicine

## 2021-10-14 ENCOUNTER — Telehealth: Payer: Self-pay

## 2021-10-14 ENCOUNTER — Ambulatory Visit (INDEPENDENT_AMBULATORY_CARE_PROVIDER_SITE_OTHER): Payer: Medicare Other | Admitting: Internal Medicine

## 2021-10-14 VITALS — BP 122/70 | HR 74 | Temp 97.8°F | Resp 16 | Ht 69.0 in | Wt 223.2 lb

## 2021-10-14 DIAGNOSIS — L505 Cholinergic urticaria: Secondary | ICD-10-CM | POA: Diagnosis not present

## 2021-10-14 MED ORDER — FAMOTIDINE 40 MG PO TABS
40.0000 mg | ORAL_TABLET | Freq: Every day | ORAL | 3 refills | Status: DC
  Filled 2021-10-14: qty 90, 90d supply, fill #0

## 2021-10-14 NOTE — Patient Instructions (Addendum)
°  It was great seeing you today!  Plan discussed at today's visit: -Symptoms consistent with cholinergic urticaria  -Take Zrytec and Pepcid in the morning, Allegra/Claritin at night -Use Benadryl as needed -Try to avoid hot water/showers and use cooler water and loose fitting clothing, increased body temperature like sweating can trigger allergic response  -If symptoms not better controlled in a few weeks, we will have you see an allergist  -Can continue to use steroid cream  Follow up in: 2 weeks   Take care and let us know if you have any questions or concerns prior to your next visit.  Dr. Rosana Berger

## 2021-10-14 NOTE — Progress Notes (Signed)
Acute Office Visit  Subjective:    Patient ID: Richard Mueller, male    DOB: 07-16-1956, 66 y.o.   MRN: [id number]  Chief Complaint  Patient presents with   Rash    HPI Patient is in today for rash. Had a back surgery with placement of titanium rod 3 weeks ago but no other medication changes or procedures.Marland Kitchen   RASH Duration: 10 days  Location: generalized, started on legs, resolved then arms, back of neck, trunk, etc.  Itching: yes Burning: yes Redness: yes Oozing: no Scaling: no Blisters: no Painful: no Fevers: no Change in detergents/soaps/personal care products: no Recent illness: no Recent travel:no History of same: no Context: better Alleviating factors: benadryl Aggravating factors: hot showers  Treatments attempted:benadryl, Allegra at night, Zytrec in the morning have lessened symptoms. Using hydrocortisone cream as well. Shortness of breath: no  Throat/tongue swelling: no Myalgias/arthralgias: no  Past Medical History:  Diagnosis Date   Allergic rhinitis, cause unspecified    Allergy    Bronchitis    Cancer ([place]) 2020   skin cancer   Hyperlipidemia    Hypertension    Metabolic syndrome    Obesity, unspecified    Osteoarthritis    Osteoarthrosis, unspecified whether generalized or localized, unspecified site    Pneumonia    Right upper quadrant pain     Past Surgical History:  Procedure Laterality Date   APPENDECTOMY  11/2018   COLONOSCOPY     COLONOSCOPY WITH PROPOFOL N/A 08/08/2020   Procedure: COLONOSCOPY WITH PROPOFOL;  Surgeon: [name], MD;  Location: [facility] [place];  Service: Endoscopy;  Laterality: N/A;  priority 4   LAPAROSCOPIC APPENDECTOMY N/A 11/28/2018   Procedure: APPENDECTOMY LAPAROSCOPIC;  Surgeon: Jules Husbands, MD;  Location: [facility];  Service: General;  Laterality: N/A;   ORTHOPEDIC SURGERY     POLYPECTOMY  08/08/2020   Procedure: POLYPECTOMY;  Surgeon: [name], MD;  Location: [facility];   Service: Endoscopy;;   SKIN CANCER DESTRUCTION Left 03/06/2015   Hand- Dr. Phillip Heal   SKIN CANCER EXCISION Left 08/2018   Dr. Phillip Heal on Left Arm    TRANSFORAMINAL LUMBAR INTERBODY FUSION W/ MIS 1 LEVEL Left 10/10/2020   Procedure: Right Lumbar Five Sacral One Minimally invasive transforaminal lumbar interbody fusion;  Surgeon: Judith Part, MD;  Location: [facility];  Service: Neurosurgery;  Laterality: Left;  posterior    Family History  Problem Relation Age of Onset   Heart disease Mother    Heart attack Mother    COPD Mother    Heart disease Father    Hypertension Father    COPD Father    Cancer Father         Prostate and Skin   COPD Sister    Suicidality Brother    Heart attack Brother 54       2014   CAD Brother    Hypertension Daughter    Depression Daughter     Social History   Socioeconomic History   Marital status: Married    Spouse name: Butch Penny   Number of children: 2   Years of education: Not on file   Highest education level: Some college, no degree  Occupational History   Not on file  Tobacco Use   Smoking status: Former    Packs/day: 1.00    Years: 15.00    Pack years: 15.00    Types: Cigarettes    Start date: 08/24/1987    Quit date: 08/26/2002  Years since quitting: 19.1   Smokeless tobacco: Never  Vaping Use   Vaping Use: Never used  Substance and Sexual Activity   Alcohol use: Yes    Alcohol/week: 0.0 standard drinks    Comment: ocassional   Drug use: No   Sexual activity: Yes    Partners: Female    Birth control/protection: None  Other Topics Concern   Not on file  Social History Narrative   Not on file   Social Determinants of Health   Financial Resource Strain: Not on file  Food Insecurity: Not on file  Transportation Needs: Not on file  Physical Activity: Not on file  Stress: Not on file  Social Connections: Not on file  Intimate Partner Violence: Not on file    Outpatient Medications Prior to Visit  Medication Sig Dispense  Refill   acetaminophen (TYLENOL) 500 MG tablet Take 1,000 mg by mouth every 6 (six) hours as needed.     Ascorbic Acid (VITAMIN C PO) Take 1 tablet by mouth every other day.     aspirin 81 MG tablet Take 1 tablet (81 mg total) by mouth daily. Okay to restart on 09/30/21 30 tablet    atorvastatin (LIPITOR) 20 MG tablet Take 1 tablet (20 mg total) by mouth daily. 90 tablet 0   calcium carbonate (OS-CAL - DOSED IN MG OF ELEMENTAL CALCIUM) 1250 (500 Ca) MG tablet Take 1 tablet by mouth daily.     carvedilol (COREG) 3.125 MG tablet TAKE 1 TABLET BY MOUTH 2 TIMES DAILY. 180 tablet 1   Cholecalciferol (VITAMIN D) 50 MCG (2000 UT) tablet Take 2,000 Units by mouth daily.     COVID-19 mRNA bivalent vaccine, Pfizer, (PFIZER COVID-19 VAC BIVALENT) injection Inject into the muscle. (Patient not taking: Reported on 09/10/2021) 0.3 mL 0   desonide (DESOWEN) 0.05 % cream Apply as directed to armpits twice a day for 1-2 weeks (Patient not taking: Reported on 09/10/2021) 15 g 0   GINKGO BILOBA PO Take 1 tablet by mouth daily.     losartan (COZAAR) 50 MG tablet TAKE 1 TABLET BY MOUTH DAILY. 90 tablet 0   MAGNESIUM PO Take 1 tablet by mouth every other day.     Multiple Vitamin (MULTIVITAMIN) tablet Take 1 tablet by mouth daily.     naproxen (NAPROSYN) 500 MG tablet TAKE 1 TABLET BY MOUTH TWICE DAILY (Patient taking differently: Take 500 mg by mouth 2 (two) times daily as needed for moderate pain.) 60 tablet 5   Omega-3 Fatty Acids (FISH OIL) 1000 MG CAPS Take 1,000 mg by mouth daily.     oxyCODONE-acetaminophen (PERCOCET) 5-325 MG tablet Take 1 tablet by mouth every 4 (four) hours as needed for severe pain. 20 tablet 0   No facility-administered medications prior to visit.    Allergies  Allergen Reactions   Lisinopril Rash    pins and needles in arms    Review of Systems  Constitutional:  Negative for chills and fever.  Eyes:  Negative for visual disturbance.  Respiratory:  Negative for cough and shortness of  breath.   Cardiovascular:  Negative for chest pain.  Gastrointestinal:  Negative for abdominal pain.  Skin:  Positive for rash.      Objective:    Physical Exam Constitutional:      Appearance: Normal appearance.  HENT:     Head: Normocephalic and atraumatic.  Eyes:     Conjunctiva/sclera: Conjunctivae normal.  Cardiovascular:     Rate and Rhythm: Normal rate and regular  rhythm.  Pulmonary:     Effort: Pulmonary effort is normal.     Breath sounds: Normal breath sounds.  Musculoskeletal:     Right lower leg: No edema.     Left lower leg: No edema.  Skin:    General: Skin is warm and dry.     Comments: Streaked, macular erythematous allergic appearing rash on bilateral forearms.  Neurological:     General: No focal deficit present.     Mental Status: He is alert. Mental status is at baseline.  Psychiatric:        Mood and Affect: Mood normal.        Behavior: Behavior normal.    BP 122/70    Pulse 74    Temp 97.8 F (36.6 C)    Resp 16    Ht 5\' 9"  (1.753 m)    Wt 223 lb 3.2 oz (101.2 kg)    SpO2 96%    BMI 32.96 kg/m  Wt Readings from Last 3 Encounters:  09/22/21 206 lb (93.4 kg)  09/16/21 220 lb 4.8 oz (99.9 kg)  07/15/21 216 lb 9.6 oz (98.2 kg)    There are no preventive care reminders to display for this patient.  There are no preventive care reminders to display for this patient.   Lab Results  Component Value Date   TSH 1.120 05/26/2015   Lab Results  Component Value Date   WBC 5.6 09/16/2021   HGB 13.9 09/16/2021   HCT 41.5 09/16/2021   MCV 91.2 09/16/2021   PLT 203 09/16/2021   Lab Results  Component Value Date   NA 134 (L) 09/16/2021   K 4.4 09/16/2021   CO2 24 09/16/2021   GLUCOSE 97 09/16/2021   BUN 17 09/16/2021   CREATININE 1.03 09/16/2021   BILITOT 0.7 06/18/2020   ALKPHOS 57 11/27/2018   AST 20 06/18/2020   ALT 18 06/18/2020   PROT 6.6 06/18/2020   ALBUMIN 4.3 11/27/2018   CALCIUM 8.9 09/16/2021   ANIONGAP 8 09/16/2021   Lab  Results  Component Value Date   CHOL 131 06/18/2020   Lab Results  Component Value Date   HDL 49 06/18/2020   Lab Results  Component Value Date   LDLCALC 60 06/18/2020   Lab Results  Component Value Date   TRIG 140 06/18/2020   Lab Results  Component Value Date   CHOLHDL 2.7 06/18/2020   Lab Results  Component Value Date   HGBA1C 5.5 12/18/2019       Assessment & Plan:   1. Urticaria, cholinergic: Consistent with cholinergic urticaria.  Continue to treat with H2 blockers both morning and night, will add H1 blocker to take as well to control allergic symptoms.  Continue to use Benadryl and hydrocortisone cream as needed.  Discussed avoiding triggers such as hot showers, tight clothing and overheating.  Follow-up in 2 weeks to recheck, consider adding Singulair at that time or referral to allergist.  - famotidine (PEPCID) 40 MG tablet; Take 1 tablet (40 mg total) by mouth daily.  Dispense: 90 tablet; Refill: Calvin, DO

## 2021-10-14 NOTE — Telephone Encounter (Signed)
Patient can have the Welcome to Medicare with his provider anytime until 03/22/2022.   His medicare part B began 03/23/2021 according to [employer].  He is not eligible for his AWV-Initial with the nurse health advisor until 366 days after the [place] visit is completed.  If he does not have the [place] visit before 03/22/2022 he can schedule the AWV-Initial with the nurse health advisor after 03/23/2022.  So for now he needs to schedule the WTM with Dr. Ancil Boozer before 03/22/2022.  Hope this helps.

## 2021-10-14 NOTE — Telephone Encounter (Signed)
Pt is scheduled    Copied from [place] [phone]. Topic: Appointment Scheduling - Scheduling Inquiry for Clinic >> Oct 14, 2021 12:53 PM Pawlus, Brayton Layman A wrote: Reason for CRM: Pts wife called in with some questions regarding medicare, pt wanted to know if he needs to schedule a first time medicare visit, please advise.

## 2021-10-21 ENCOUNTER — Other Ambulatory Visit: Payer: Self-pay | Admitting: Cardiovascular Disease

## 2021-10-21 ENCOUNTER — Other Ambulatory Visit: Payer: Self-pay

## 2021-10-21 DIAGNOSIS — I10 Essential (primary) hypertension: Secondary | ICD-10-CM

## 2021-10-21 MED FILL — Losartan Potassium Tab 50 MG: ORAL | 90 days supply | Qty: 90 | Fill #0 | Status: AC

## 2021-10-21 MED FILL — Carvedilol Tab 3.125 MG: ORAL | 90 days supply | Qty: 180 | Fill #0 | Status: AC

## 2021-10-28 ENCOUNTER — Ambulatory Visit (INDEPENDENT_AMBULATORY_CARE_PROVIDER_SITE_OTHER): Payer: Medicare Other | Admitting: Internal Medicine

## 2021-10-28 ENCOUNTER — Other Ambulatory Visit: Payer: Self-pay

## 2021-10-28 ENCOUNTER — Ambulatory Visit: Payer: Medicare Other | Admitting: Internal Medicine

## 2021-10-28 VITALS — BP 124/72 | HR 68 | Temp 98.1°F | Resp 16 | Ht 69.0 in | Wt 218.1 lb

## 2021-10-28 DIAGNOSIS — L505 Cholinergic urticaria: Secondary | ICD-10-CM

## 2021-10-28 MED ORDER — MONTELUKAST SODIUM 10 MG PO TABS
10.0000 mg | ORAL_TABLET | Freq: Every day | ORAL | 3 refills | Status: DC
  Filled 2021-10-28: qty 30, 30d supply, fill #0
  Filled 2021-12-14: qty 30, 30d supply, fill #1
  Filled 2022-01-11: qty 60, 60d supply, fill #2

## 2021-10-28 NOTE — Progress Notes (Signed)
Established Patient Office Visit  Subjective:  Patient ID: Richard Mueller, male    DOB: 09-03-1955  Age: 66 y.o. MRN: [id number]  CC:  Chief Complaint  Patient presents with   Follow-up    HPI Richard Mueller [facility] Mueller presents for follow up on rash, thought to be cholinergic urticaria.  Currently being treated with Zyrtec in the morning, Allegra later at night with Pepcid added and Benadryl as needed.  Today he states that the rash frequency and severity has improved. He has not had to take benadryl or use the cortisone cream. The burning pain and itching has improved significantly, however other day he had his legs dipped in the hot tub and he had another outbreak of the rash on bilateral forearms. Overall he has been trying to avoid hot showers and being over heated in general.   Blood pressure doing well today, had been low after his back surgery 09/22/21. Currently on Losartan 50, Coreg 3.125 BID, not checking at home but denies light headedness, presyncope, etc.   Past Medical History:  Diagnosis Date   Allergic rhinitis, cause unspecified    Allergy    Bronchitis    Cancer ([place]) 2020   skin cancer   Hyperlipidemia    Hypertension    Metabolic syndrome    Obesity, unspecified    Osteoarthritis    Osteoarthrosis, unspecified whether generalized or localized, unspecified site    Pneumonia    Right upper quadrant pain     Past Surgical History:  Procedure Laterality Date   APPENDECTOMY  11/2018   COLONOSCOPY     COLONOSCOPY WITH PROPOFOL N/A 08/08/2020   Procedure: COLONOSCOPY WITH PROPOFOL;  Surgeon: [name], MD;  Location: [place];  Service: Endoscopy;  Laterality: N/A;  priority 4   LAPAROSCOPIC APPENDECTOMY N/A 11/28/2018   Procedure: APPENDECTOMY LAPAROSCOPIC;  Surgeon: Jules Husbands, MD;  Location: [facility];  Service: General;  Laterality: N/A;   ORTHOPEDIC SURGERY     POLYPECTOMY  08/08/2020   Procedure: POLYPECTOMY;  Surgeon: [name], MD;  Location: [place];  Service: Endoscopy;;   SKIN CANCER DESTRUCTION Left 03/06/2015   Hand- Dr. Phillip Heal   SKIN CANCER EXCISION Left 08/2018   Dr. Phillip Heal on Left Arm    TRANSFORAMINAL LUMBAR INTERBODY FUSION W/ MIS 1 LEVEL Left 10/10/2020   Procedure: Right Lumbar Five Sacral One Minimally invasive transforaminal lumbar interbody fusion;  Surgeon: Judith Part, MD;  Location: [place];  Service: Neurosurgery;  Laterality: Left;  posterior    Family History  Problem Relation Age of Onset   Heart disease Mother    Heart attack Mother    COPD Mother    Heart disease Father    Hypertension Father    COPD Father    Cancer Father         Prostate and Skin   COPD Sister    Suicidality Brother    Heart attack Brother 54       2014   CAD Brother    Hypertension Daughter    Depression Daughter     Social History   Socioeconomic History   Marital status: Married    Spouse name: Richard Mueller   Number of children: 2   Years of education: Not on file   Highest education level: Some college, no degree  Occupational History   Not on file  Tobacco Use   Smoking status: Former    Packs/day: 1.00    Years: 15.00  Pack years: 15.00    Types: Cigarettes    Start date: 08/24/1987    Quit date: 08/26/2002    Years since quitting: 19.1   Smokeless tobacco: Never  Vaping Use   Vaping Use: Never used  Substance and Sexual Activity   Alcohol use: Yes    Alcohol/week: 0.0 standard drinks    Comment: ocassional   Drug use: No   Sexual activity: Yes    Partners: Female    Birth control/protection: None  Other Topics Concern   Not on file  Social History Narrative   Not on file   Social Determinants of Health   Financial Resource Strain: Not on file  Food Insecurity: Not on file  Transportation Needs: Not on file  Physical Activity: Not on file  Stress: Not on file  Social Connections: Not on file  Intimate Partner Violence: Not on file    Outpatient  Medications Prior to Visit  Medication Sig Dispense Refill   acetaminophen (TYLENOL) 500 MG tablet Take 1,000 mg by mouth every 6 (six) hours as needed.     Ascorbic Acid (VITAMIN C PO) Take 1 tablet by mouth every other day.     aspirin 81 MG tablet Take 1 tablet (81 mg total) by mouth daily. Okay to restart on 09/30/21 30 tablet    atorvastatin (LIPITOR) 20 MG tablet Take 1 tablet (20 mg total) by mouth daily. 90 tablet 0   calcium carbonate (OS-CAL - DOSED IN MG OF ELEMENTAL CALCIUM) 1250 (500 Ca) MG tablet Take 1 tablet by mouth daily.     carvedilol (COREG) 3.125 MG tablet TAKE 1 TABLET BY MOUTH 2 TIMES DAILY. 180 tablet 0   Cholecalciferol (VITAMIN D) 50 MCG (2000 UT) tablet Take 2,000 Units by mouth daily.     COVID-19 mRNA bivalent vaccine, Pfizer, (PFIZER COVID-19 VAC BIVALENT) injection Inject into the muscle. 0.3 mL 0   famotidine (PEPCID) 40 MG tablet Take 1 tablet (40 mg total) by mouth daily. 90 tablet 3   GINKGO BILOBA PO Take 1 tablet by mouth daily.     losartan (COZAAR) 50 MG tablet TAKE 1 TABLET BY MOUTH DAILY. 90 tablet 0   MAGNESIUM PO Take 1 tablet by mouth every other day.     Multiple Vitamin (MULTIVITAMIN) tablet Take 1 tablet by mouth daily.     naproxen (NAPROSYN) 500 MG tablet TAKE 1 TABLET BY MOUTH TWICE DAILY (Patient taking differently: Take 500 mg by mouth 2 (two) times daily as needed for moderate pain.) 60 tablet 5   Omega-3 Fatty Acids (FISH OIL) 1000 MG CAPS Take 1,000 mg by mouth daily.     No facility-administered medications prior to visit.    Allergies  Allergen Reactions   Lisinopril Rash    pins and needles in arms    ROS Review of Systems  Constitutional:  Negative for chills and fever.  Eyes:  Negative for visual disturbance.  Respiratory:  Negative for cough and shortness of breath.   Cardiovascular:  Negative for chest pain.  Skin:  Positive for color change.  Neurological:  Negative for dizziness, weakness and light-headedness.      Objective:    Physical Exam Constitutional:      Appearance: Normal appearance.  HENT:     Head: Normocephalic and atraumatic.  Eyes:     Conjunctiva/sclera: Conjunctivae normal.  Cardiovascular:     Rate and Rhythm: Normal rate and regular rhythm.  Pulmonary:     Effort: Pulmonary effort is normal.  Breath sounds: Normal breath sounds.  Musculoskeletal:     Right lower leg: No edema.     Left lower leg: No edema.  Skin:    General: Skin is warm and dry.     Comments: No rashes present currently  Neurological:     General: No focal deficit present.     Mental Status: He is alert. Mental status is at baseline.  Psychiatric:        Mood and Affect: Mood normal.        Behavior: Behavior normal.    BP 124/72    Pulse 68    Temp 98.1 F (36.7 C) (Oral)    Resp 16    Ht '5\' 9"'$  (1.753 m)    Wt 218 lb 1.6 oz (98.9 kg)    SpO2 96%    BMI 32.21 kg/m  Wt Readings from Last 3 Encounters:  10/14/21 223 lb 3.2 oz (101.2 kg)  09/22/21 206 lb (93.4 kg)  09/16/21 220 lb 4.8 oz (99.9 kg)     There are no preventive care reminders to display for this patient.  There are no preventive care reminders to display for this patient.  Lab Results  Component Value Date   TSH 1.120 05/26/2015   Lab Results  Component Value Date   WBC 5.6 09/16/2021   HGB 13.9 09/16/2021   HCT 41.5 09/16/2021   MCV 91.2 09/16/2021   PLT 203 09/16/2021   Lab Results  Component Value Date   NA 134 (L) 09/16/2021   K 4.4 09/16/2021   CO2 24 09/16/2021   GLUCOSE 97 09/16/2021   BUN 17 09/16/2021   CREATININE 1.03 09/16/2021   BILITOT 0.7 06/18/2020   ALKPHOS 57 11/27/2018   AST 20 06/18/2020   ALT 18 06/18/2020   PROT 6.6 06/18/2020   ALBUMIN 4.3 11/27/2018   CALCIUM 8.9 09/16/2021   ANIONGAP 8 09/16/2021   Lab Results  Component Value Date   CHOL 131 06/18/2020   Lab Results  Component Value Date   HDL 49 06/18/2020   Lab Results  Component Value Date   LDLCALC 60 06/18/2020    Lab Results  Component Value Date   TRIG 140 06/18/2020   Lab Results  Component Value Date   CHOLHDL 2.7 06/18/2020   Lab Results  Component Value Date   HGBA1C 5.5 12/18/2019      Assessment & Plan:   1. Urticaria, cholinergic: Appears to be resolving, hot tub triggered it this time. Continue anti-histamine regimen, will add Singulair as well. If symptoms worsen, consider following up with his Dermatologist. Follow up here in 1 month for recheck.  - montelukast (SINGULAIR) 10 MG tablet; Take 1 tablet (10 mg total) by mouth at bedtime.  Dispense: 30 tablet; Refill: 3   Follow-up: Return in about 4 weeks (around 11/25/2021).    Teodora Medici, DO

## 2021-10-28 NOTE — Patient Instructions (Addendum)
It was great seeing you today! ? ?Plan discussed at today's visit: ?-Continue current regimen with Singulair added ?-If rash gets worse, recommend following up with Dermatologist  ? ?Follow up in: 1 month  ? ?Take care and let us know if you have any questions or concerns prior to your next visit. ? ?Dr. Rosana Berger ? ?

## 2021-11-20 ENCOUNTER — Encounter: Payer: Self-pay | Admitting: Cardiovascular Disease

## 2021-11-20 ENCOUNTER — Other Ambulatory Visit: Payer: Self-pay

## 2021-11-20 ENCOUNTER — Other Ambulatory Visit: Payer: Self-pay | Admitting: *Deleted

## 2021-11-20 ENCOUNTER — Ambulatory Visit (INDEPENDENT_AMBULATORY_CARE_PROVIDER_SITE_OTHER): Payer: Medicare Other | Admitting: Cardiovascular Disease

## 2021-11-20 VITALS — BP 110/68 | HR 62 | Ht 68.0 in | Wt 224.5 lb

## 2021-11-20 DIAGNOSIS — I10 Essential (primary) hypertension: Secondary | ICD-10-CM

## 2021-11-20 DIAGNOSIS — I2510 Atherosclerotic heart disease of native coronary artery without angina pectoris: Secondary | ICD-10-CM | POA: Diagnosis not present

## 2021-11-20 DIAGNOSIS — E785 Hyperlipidemia, unspecified: Secondary | ICD-10-CM | POA: Diagnosis not present

## 2021-11-20 DIAGNOSIS — R002 Palpitations: Secondary | ICD-10-CM | POA: Diagnosis not present

## 2021-11-20 MED ORDER — ATORVASTATIN CALCIUM 20 MG PO TABS
20.0000 mg | ORAL_TABLET | Freq: Every day | ORAL | 3 refills | Status: DC
  Filled 2021-11-20 – 2021-11-25 (×2): qty 90, 90d supply, fill #0
  Filled 2022-05-04: qty 90, 90d supply, fill #1
  Filled 2022-07-30: qty 90, 90d supply, fill #2

## 2021-11-20 MED ORDER — CARVEDILOL 3.125 MG PO TABS
ORAL_TABLET | Freq: Two times a day (BID) | ORAL | 3 refills | Status: DC
  Filled 2021-11-20: qty 180, 90d supply, fill #0
  Filled 2022-05-04: qty 180, 90d supply, fill #1
  Filled 2022-07-30: qty 180, 90d supply, fill #2
  Filled 2022-10-15: qty 180, 90d supply, fill #3

## 2021-11-20 MED ORDER — LOSARTAN POTASSIUM 50 MG PO TABS
ORAL_TABLET | Freq: Every day | ORAL | 3 refills | Status: DC
  Filled 2021-11-20: qty 90, 90d supply, fill #0
  Filled 2022-05-04: qty 90, 90d supply, fill #1
  Filled 2022-07-30: qty 90, 90d supply, fill #2
  Filled 2022-10-15: qty 90, 90d supply, fill #3

## 2021-11-20 NOTE — Progress Notes (Signed)
?  ?Cardiology Office Note ? ? ?Date:  11/20/2021  ? ?ID:  Richard Mueller, DOB [birth year], MRN [id number] ? ?PCP:  Steele Sizer, MD  ?Cardiologist:   Richard Sacramento, MD  ? ?Chief Complaint  ?Patient presents with  ? Other  ?  12 month f/u no complaints today. Meds reviewed verbally with pt.  ? ? ?  ?History of Present Illness: ?Richard Mueller is a 66 y.o. male who presents for a follow-up regarding moderate nonobstructive coronary artery disease and palpitations.  ?He does have a family history of CAD.  He is known to have aortic atherosclerosis on previous CT imaging. ?He has history of palpitations.  ZIO monitor in 2021 showed 1 episode of wide-complex tachycardia lasting only 4 beats and was likely due to SVT with aberrancy although slow nonsustained ventricular tachycardia could not be excluded.  He was also noted to have short runs of SVT the longest lasted 16 beats.  Echocardiogram in August 2021 showed normal LV systolic function with mild mitral regurgitation and no significant pulmonary hypertension.  Cardiac CTA in September 2021 showed a calcium score of 857 with evidence of moderate stenosis in the proximal LAD as well as mild left circumflex disease.  None of the lesions were significant by FFR. ? ?He underwent another lumbar spine surgery in January of this year.  He is recovering well.  He denies any chest pain or shortness of breath.  No side effects with medications. ? ? ?Past Medical History:  ?Diagnosis Date  ? Allergic rhinitis, cause unspecified   ? Allergy   ? Bronchitis   ? Cancer [facility]) 2020  ? skin cancer  ? Hyperlipidemia   ? Hypertension   ? Metabolic syndrome   ? Obesity, unspecified   ? Osteoarthritis   ? Osteoarthrosis, unspecified whether generalized or localized, unspecified site   ? Pneumonia   ? Right upper quadrant pain   ? ? ?Past Surgical History:  ?Procedure Laterality Date  ? APPENDECTOMY  11/2018  ? COLONOSCOPY    ? COLONOSCOPY WITH PROPOFOL N/A 08/08/2020   ? Procedure: COLONOSCOPY WITH PROPOFOL;  Surgeon: [name], MD;  Location: [place];  Service: Endoscopy;  Laterality: N/A;  priority 4  ? LAPAROSCOPIC APPENDECTOMY N/A 11/28/2018  ? Procedure: APPENDECTOMY LAPAROSCOPIC;  Surgeon: Jules Husbands, MD;  Location: [facility];  Service: General;  Laterality: N/A;  ? ORTHOPEDIC SURGERY    ? POLYPECTOMY  08/08/2020  ? Procedure: POLYPECTOMY;  Surgeon: [name], MD;  Location: [place];  Service: Endoscopy;;  ? SKIN CANCER DESTRUCTION Left 03/06/2015  ? Hand- Dr. Phillip Heal  ? SKIN CANCER EXCISION Left 08/2018  ? Dr. Phillip Heal on Left Arm   ? TRANSFORAMINAL LUMBAR INTERBODY FUSION W/ MIS 1 LEVEL Left 10/10/2020  ? Procedure: Right Lumbar Five Sacral One Minimally invasive transforaminal lumbar interbody fusion;  Surgeon: Judith Part, MD;  Location: [place];  Service: Neurosurgery;  Laterality: Left;  posterior  ? ? ? ?Current Outpatient Medications  ?Medication Sig Dispense Refill  ? acetaminophen (TYLENOL) 500 MG tablet Take 1,000 mg by mouth every 6 (six) hours as needed.    ? Ascorbic Acid (VITAMIN C PO) Take 1 tablet by mouth every other day.    ? aspirin 81 MG tablet Take 1 tablet (81 mg total) by mouth daily. Okay to restart on 09/30/21 30 tablet   ? atorvastatin (LIPITOR) 20 MG tablet Take 1 tablet (20 mg total) by mouth daily. 90 tablet 0  ?  calcium carbonate (OS-CAL - DOSED IN MG OF ELEMENTAL CALCIUM) 1250 (500 Ca) MG tablet Take 1 tablet by mouth daily.    ? carvedilol (COREG) 3.125 MG tablet TAKE 1 TABLET BY MOUTH 2 TIMES DAILY. 180 tablet 0  ? Cholecalciferol (VITAMIN D) 50 MCG (2000 UT) tablet Take 2,000 Units by mouth daily.    ? COVID-19 mRNA bivalent vaccine, Pfizer, (PFIZER COVID-19 VAC BIVALENT) injection Inject into the muscle. 0.3 mL 0  ? famotidine (PEPCID) 40 MG tablet Take 1 tablet (40 mg total) by mouth daily. 90 tablet 3  ? GINKGO BILOBA PO Take 1 tablet by mouth daily.    ? losartan (COZAAR) 50 MG tablet TAKE 1 TABLET BY  MOUTH DAILY. 90 tablet 0  ? MAGNESIUM PO Take 1 tablet by mouth every other day.    ? montelukast (SINGULAIR) 10 MG tablet Take 1 tablet (10 mg total) by mouth at bedtime. 30 tablet 3  ? Multiple Vitamin (MULTIVITAMIN) tablet Take 1 tablet by mouth daily.    ? Omega-3 Fatty Acids (FISH OIL) 1000 MG CAPS Take 1,000 mg by mouth daily.    ? ?No current facility-administered medications for this visit.  ? ? ?Allergies:   Lisinopril  ? ? ?Social History:  The patient  reports that he quit smoking about 19 years ago. His smoking use included cigarettes. He started smoking about 34 years ago. He has a 15.00 pack-year smoking history. He has never used smokeless tobacco. He reports current alcohol use. He reports that he does not use drugs.  ? ?Family History:  The patient's family history includes CAD in his brother; COPD in his father, mother, and sister; Cancer in his father; Depression in his daughter; Heart attack in his mother; Heart attack (age of onset: 54) in his brother; Heart disease in his father and mother; Hypertension in his daughter and father; Suicidality in his brother.  ? ? ?ROS:  Please see the history of present illness.   Otherwise, review of systems are positive for none.   All other systems are reviewed and negative.  ? ? ?PHYSICAL EXAM: ?VS:  BP 110/68 (BP Location: Left Arm, Patient Position: Sitting, Cuff Size: Normal)   Pulse 62   Ht '5\' 8"'$  (1.727 m)   Wt 224 lb 8 oz (101.8 kg)   SpO2 98%   BMI 34.14 kg/m?  , BMI Body mass index is 34.14 kg/m?. ?GEN: Well nourished, well developed, in no acute distress  ?HEENT: normal  ?Neck: no JVD, carotid bruits, or masses ?Cardiac: RRR; no murmurs, rubs, or gallops,no edema  ?Respiratory:  clear to auscultation bilaterally, normal work of breathing ?GI: soft, nontender, nondistended, + BS ?MS: no deformity or atrophy  ?Skin: warm and dry, no rash ?Neuro:  Strength and sensation are intact ?Psych: euthymic mood, full affect ? ? ?EKG:  EKG is ordered  today. ?The ekg ordered today demonstrates normal sinus rhythm with left anterior fascicular block, minimal LVH ? ? ?Recent Labs: ?09/16/2021: BUN 17; Creatinine, Ser 1.03; Hemoglobin 13.9; Platelets 203; Potassium 4.4; Sodium 134  ? ? ?Lipid Panel ?   ?Component Value Date/Time  ? CHOL 131 06/18/2020 0834  ? CHOL 147 12/20/2017 1516  ? TRIG 140 06/18/2020 0834  ? TRIG 108 02/27/2013 1609  ? HDL 49 06/18/2020 0834  ? HDL 56 12/20/2017 1516  ? HDL 42 02/27/2013 1609  ? CHOLHDL 2.7 06/18/2020 0834  ? VLDL 14 11/27/2018 1657  ? [place] 60 06/18/2020 0834  ? [place] 76 02/27/2013  1609  ? ?  ? ?Wt Readings from Last 3 Encounters:  ?11/20/21 224 lb 8 oz (101.8 kg)  ?10/28/21 218 lb 1.6 oz (98.9 kg)  ?10/14/21 223 lb 3.2 oz (101.2 kg)  ?  ? ? ? ? ?ASSESSMENT AND PLAN: ? ? ?1.  Coronary artery disease involving native coronary arteries without angina: Known moderate nonobstructive coronary artery disease on previous CTA.  Currently with no anginal symptoms.  I discussed with him the importance of healthy lifestyle changes.  Continue low-dose aspirin.   ? ?2.  Palpitations: Short runs of SVT noted on previous monitor.  Symptoms are well controlled on small dose carvedilol. ? ?3. Hyperlipidemia: I refilled atorvastatin.  I requested lipid and liver profile to be done today. ?  ?4. Essential hypertension: Blood pressure is well controlled on current medications.  I refilled carvedilol and losartan. ?  ? ? ?Disposition:   FU with me in 1 year ? ?Signed, ? ?Richard Sacramento, MD  ?11/20/2021 9:47 AM    ?[place] ?

## 2021-11-20 NOTE — Patient Instructions (Signed)
Medication Instructions:  ?Your physician recommends that you continue on your current medications as directed. Please refer to the Current Medication list given to you today. ? ?*If you need a refill on your cardiac medications before your next appointment, please call your pharmacy* ? ? ?Lab Work: ?Lipid and Lft today ? ?If you have labs (blood work) drawn today and your tests are completely normal, you will receive your results only by: ?MyChart Message (if you have MyChart) OR ?A paper copy in the mail ?If you have any lab test that is abnormal or we need to change your treatment, we will call you to review the results. ? ? ?Testing/Procedures: ?None ordered ? ? ?Follow-Up: ?At [facility], you and your health needs are our priority.  As part of our continuing mission to provide you with exceptional heart care, we have created designated Provider Care Teams.  These Care Teams include your primary Cardiologist (physician) and Advanced Practice Providers (APPs -  Physician Assistants and Nurse Practitioners) who all work together to provide you with the care you need, when you need it. ? ?We recommend signing up for the patient portal called "MyChart".  Sign up information is provided on this After Visit Summary.  MyChart is used to connect with patients for Virtual Visits (Telemedicine).  Patients are able to view lab/test results, encounter notes, upcoming appointments, etc.  Non-urgent messages can be sent to your provider as well.   ?To learn more about what you can do with MyChart, go to [email or web address].   ? ?Your next appointment:   ?Your physician wants you to follow-up in: 1 year You will receive a reminder letter in the mail two months in advance. If you don't receive a letter, please call our office to schedule the follow-up appointment. ? ? ?The format for your next appointment:   ?In Person ? ?Provider:   ?You may see Kathlyn Sacramento, MD or one of the following Advanced Practice Providers on  your designated Care Team:   ?Murray Hodgkins, NP ?Christell Faith, PA-C ?Cadence Kathlen Mody, PA-C ? ? ?Other Instructions ?N/A ? ?

## 2021-11-21 LAB — HEPATIC FUNCTION PANEL
ALT: 19 IU/L (ref 0–44)
AST: 26 IU/L (ref 0–40)
Albumin: 4.3 g/dL (ref 3.8–4.8)
Alkaline Phosphatase: 82 IU/L (ref 44–121)
Bilirubin Total: 0.4 mg/dL (ref 0.0–1.2)
Bilirubin, Direct: 0.13 mg/dL (ref 0.00–0.40)
Total Protein: 6.3 g/dL (ref 6.0–8.5)

## 2021-11-21 LAB — LIPID PANEL
Chol/HDL Ratio: 2.6 ratio (ref 0.0–5.0)
Cholesterol, Total: 128 mg/dL (ref 100–199)
HDL: 50 mg/dL (ref >39)
LDL Chol Calc (NIH): 59 mg/dL (ref 0–99)
Triglycerides: 102 mg/dL (ref 0–149)
VLDL Cholesterol Cal: 19 mg/dL (ref 5–40)

## 2021-11-25 ENCOUNTER — Other Ambulatory Visit (HOSPITAL_COMMUNITY): Payer: Self-pay

## 2021-11-25 ENCOUNTER — Other Ambulatory Visit: Payer: Self-pay

## 2021-11-25 ENCOUNTER — Encounter: Payer: Self-pay | Admitting: Internal Medicine

## 2021-11-25 ENCOUNTER — Ambulatory Visit (INDEPENDENT_AMBULATORY_CARE_PROVIDER_SITE_OTHER): Payer: Medicare Other | Admitting: Internal Medicine

## 2021-11-25 VITALS — BP 122/78 | HR 71 | Temp 97.9°F | Resp 18 | Ht 69.0 in | Wt 224.1 lb

## 2021-11-25 DIAGNOSIS — L505 Cholinergic urticaria: Secondary | ICD-10-CM

## 2021-11-25 MED ORDER — METHYLPREDNISOLONE 4 MG PO TBPK
ORAL_TABLET | ORAL | 0 refills | Status: DC
  Filled 2021-11-25: qty 21, 6d supply, fill #0

## 2021-11-25 NOTE — Progress Notes (Signed)
? ?Established Patient Office Visit ? ?Subjective:  ?Patient ID: Richard Mueller, male    DOB: 1955-10-28  Age: 66 y.o. MRN: [id number] ? ?CC:  ?Chief Complaint  ?Patient presents with  ? Follow-up  ?  4 week recheck  ? ? 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 States overall the severity and frequency of the rash has greatly improved but it still randomly will flare.  Taking hot showers will sometimes trigger the rash but sometimes it won't. He does not have the rash now but yesterday he was visiting a friend when a macular erythematous rash come up on his left knee. He takes Zyrtec in the morning, Pepcid and Singulair at night and Benadryl as needed. He had also been taking Allegra but recently stopped that and hasn't noticed any changes with the rash. Usually the rash in non-painful but pruritic but 2 days ago it flared on the top and insides of his feet while he was sitting down watching TV and was painful and burning. This lasted about 5 minutes and resolved on its own. He denies any other associated symptoms or changes involving the rash.  ? ?Past Medical History:  ?Diagnosis Date  ? Allergic rhinitis, cause unspecified   ? Allergy   ? Bronchitis   ? Cancer [facility]) 2020  ? skin cancer  ? Hyperlipidemia   ? Hypertension   ? Metabolic syndrome   ? Obesity, unspecified   ? Osteoarthritis   ? Osteoarthrosis, unspecified whether generalized or localized, unspecified site   ? Pneumonia   ? Right upper quadrant pain   ? ? ?Past Surgical History:  ?Procedure Laterality Date  ? APPENDECTOMY  11/2018  ? COLONOSCOPY    ? COLONOSCOPY WITH PROPOFOL N/A 08/08/2020  ? Procedure: COLONOSCOPY WITH PROPOFOL;  Surgeon: [name], MD;  Location: [place];  Service: Endoscopy;  Laterality: N/A;  priority 4  ? LAPAROSCOPIC APPENDECTOMY N/A 11/28/2018  ? Procedure: APPENDECTOMY LAPAROSCOPIC;  Surgeon: Jules Husbands, MD;  Location: [facility];  Service:  General;  Laterality: N/A;  ? ORTHOPEDIC SURGERY    ? POLYPECTOMY  08/08/2020  ? Procedure: POLYPECTOMY;  Surgeon: [name], MD;  Location: [place];  Service: Endoscopy;;  ? SKIN CANCER DESTRUCTION Left 03/06/2015  ? Hand- Dr. Phillip Heal  ? SKIN CANCER EXCISION Left 08/2018  ? Dr. Phillip Heal on Left Arm   ? TRANSFORAMINAL LUMBAR INTERBODY FUSION W/ MIS 1 LEVEL Left 10/10/2020  ? Procedure: Right Lumbar Five Sacral One Minimally invasive transforaminal lumbar interbody fusion;  Surgeon: Judith Part, MD;  Location: [place];  Service: Neurosurgery;  Laterality: Left;  posterior  ? ? ?Family History  ?Problem Relation Age of Onset  ? Heart disease Mother   ? Heart attack Mother   ? COPD Mother   ? Heart disease Father   ? Hypertension Father   ? COPD Father   ? Cancer Father   ?      Prostate and Skin  ? COPD Sister   ? Suicidality Brother   ? Heart attack Brother 42  ?     2014  ? CAD Brother   ? Hypertension Daughter   ? Depression Daughter   ? ? ?Social History  ? ?Socioeconomic History  ? Marital status: Married  ?  Spouse name: Butch Penny  ? Number of children: 2  ? Years of education: Not on file  ? Highest education level: Some college, no  degree  ?Occupational History  ? Not on file  ?Tobacco Use  ? Smoking status: Former  ?  Packs/day: 1.00  ?  Years: 15.00  ?  Pack years: 15.00  ?  Types: Cigarettes  ?  Start date: 08/24/1987  ?  Quit date: 08/26/2002  ?  Years since quitting: 19.2  ? Smokeless tobacco: Never  ?Vaping Use  ? Vaping Use: Never used  ?Substance and Sexual Activity  ? Alcohol use: Yes  ?  Alcohol/week: 0.0 standard drinks  ?  Comment: ocassional  ? Drug use: No  ? Sexual activity: Yes  ?  Partners: Female  ?  Birth control/protection: None  ?Other Topics Concern  ? Not on file  ?Social History Narrative  ? Not on file  ? ?Social Determinants of Health  ? ?Financial Resource Strain: Not on file  ?Food Insecurity: Not on file  ?Transportation Needs: Not on file  ?Physical Activity: Not on file   ?Stress: Not on file  ?Social Connections: Not on file  ?Intimate Partner Violence: Not on file  ? ? ?Outpatient Medications Prior to Visit  ?Medication Sig Dispense Refill  ? acetaminophen (TYLENOL) 500 MG tablet Take 1,000 mg by mouth every 6 (six) hours as needed.    ? Ascorbic Acid (VITAMIN C PO) Take 1 tablet by mouth every other day.    ? aspirin 81 MG tablet Take 1 tablet (81 mg total) by mouth daily. Okay to restart on 09/30/21 30 tablet   ? atorvastatin (LIPITOR) 20 MG tablet Take 1 tablet (20 mg total) by mouth daily. 90 tablet 3  ? calcium carbonate (OS-CAL - DOSED IN MG OF ELEMENTAL CALCIUM) 1250 (500 Ca) MG tablet Take 1 tablet by mouth daily.    ? carvedilol (COREG) 3.125 MG tablet TAKE 1 TABLET BY MOUTH 2 TIMES DAILY. 180 tablet 3  ? Cholecalciferol (VITAMIN D) 50 MCG (2000 UT) tablet Take 2,000 Units by mouth daily.    ? COVID-19 mRNA bivalent vaccine, Pfizer, (PFIZER COVID-19 VAC BIVALENT) injection Inject into the muscle. 0.3 mL 0  ? famotidine (PEPCID) 40 MG tablet Take 1 tablet (40 mg total) by mouth daily. 90 tablet 3  ? GINKGO BILOBA PO Take 1 tablet by mouth daily.    ? losartan (COZAAR) 50 MG tablet TAKE 1 TABLET BY MOUTH DAILY. 90 tablet 3  ? MAGNESIUM PO Take 1 tablet by mouth every other day.    ? montelukast (SINGULAIR) 10 MG tablet Take 1 tablet (10 mg total) by mouth at bedtime. 30 tablet 3  ? Multiple Vitamin (MULTIVITAMIN) tablet Take 1 tablet by mouth daily.    ? Omega-3 Fatty Acids (FISH OIL) 1000 MG CAPS Take 1,000 mg by mouth daily.    ? ?No facility-administered medications prior to visit.  ? ? ?Allergies  ?Allergen Reactions  ? Lisinopril Rash  ?  pins and needles in arms  ? ? ?ROS ?Review of Systems  ?Constitutional:  Negative for chills and fever.  ?Eyes:  Negative for visual disturbance.  ?Respiratory:  Negative for cough and shortness of breath.   ?Cardiovascular:  Negative for chest pain.  ?Skin:  Positive for color change.  ?Neurological:  Negative for dizziness, weakness  and light-headedness.  ? ?  ?Objective:  ?  ?Physical Exam ?Constitutional:   ?   Appearance: Normal appearance.  ?HENT:  ?   Head: Normocephalic and atraumatic.  ?Eyes:  ?   Conjunctiva/sclera: Conjunctivae normal.  ?Cardiovascular:  ?   Rate and Rhythm:  Normal rate and regular rhythm.  ?Pulmonary:  ?   Effort: Pulmonary effort is normal.  ?   Breath sounds: Normal breath sounds.  ?Musculoskeletal:  ?   Right lower leg: No edema.  ?   Left lower leg: No edema.  ?Skin: ?   General: Skin is warm and dry.  ?   Comments: No rashes present currently  ?Neurological:  ?   General: No focal deficit present.  ?   Mental Status: He is alert. Mental status is at baseline.  ?Psychiatric:     ?   Mood and Affect: Mood normal.     ?   Behavior: Behavior normal.  ? ? ?BP 122/78   Pulse 71   Temp 97.9 ?F (36.6 ?C) (Oral)   Resp 18   Ht '5\' 9"'$  (1.753 m)   Wt 224 lb 1.6 oz (101.7 kg)   SpO2 95%   BMI 33.09 kg/m?  ?Wt Readings from Last 3 Encounters:  ?11/25/21 224 lb 1.6 oz (101.7 kg)  ?11/20/21 224 lb 8 oz (101.8 kg)  ?10/28/21 218 lb 1.6 oz (98.9 kg)  ? ? ? ?There are no preventive care reminders to display for this patient. ? ?There are no preventive care reminders to display for this patient. ? ?Lab Results  ?Component Value Date  ? TSH 1.120 05/26/2015  ? ?Lab Results  ?Component Value Date  ? WBC 5.6 09/16/2021  ? HGB 13.9 09/16/2021  ? HCT 41.5 09/16/2021  ? MCV 91.2 09/16/2021  ? PLT 203 09/16/2021  ? ?Lab Results  ?Component Value Date  ? NA 134 (L) 09/16/2021  ? K 4.4 09/16/2021  ? CO2 24 09/16/2021  ? GLUCOSE 97 09/16/2021  ? BUN 17 09/16/2021  ? CREATININE 1.03 09/16/2021  ? BILITOT 0.4 11/20/2021  ? ALKPHOS 82 11/20/2021  ? AST 26 11/20/2021  ? ALT 19 11/20/2021  ? PROT 6.3 11/20/2021  ? ALBUMIN 4.3 11/20/2021  ? CALCIUM 8.9 09/16/2021  ? ANIONGAP 8 09/16/2021  ? ?Lab Results  ?Component Value Date  ? CHOL 128 11/20/2021  ? ?Lab Results  ?Component Value Date  ? HDL 50 11/20/2021  ? ?Lab Results  ?Component Value  Date  ? [place] 59 11/20/2021  ? ?Lab Results  ?Component Value Date  ? TRIG 102 11/20/2021  ? ?Lab Results  ?Component Value Date  ? CHOLHDL 2.6 11/20/2021  ? ?Lab Results  ?Component Value Date  ? HGBA1C 5.5 04

## 2021-11-25 NOTE — Patient Instructions (Addendum)
It was great seeing you today! ? ?Plan discussed at today's visit: ?-Try steroid pack, wait until rash relapses  ?-Continue Zyrtec and Pepcid, Benadryl as needed and Singulair  ?-Follow up with Dermatology for second option in the next few weeks  ? ?Follow up in: 3 months  ? ?Take care and let us know if you have any questions or concerns prior to your next visit. ? ?Dr. Rosana Berger ? ?

## 2021-11-26 ENCOUNTER — Other Ambulatory Visit (HOSPITAL_COMMUNITY): Payer: Self-pay

## 2021-12-14 ENCOUNTER — Other Ambulatory Visit: Payer: Self-pay

## 2021-12-31 ENCOUNTER — Other Ambulatory Visit: Payer: Self-pay

## 2022-01-11 ENCOUNTER — Other Ambulatory Visit: Payer: Self-pay

## 2022-01-11 NOTE — Progress Notes (Addendum)
Patient: Richard Mueller, Male    DOB: 02/18/1956, 66 y.o.   MRN: [id number]  Visit Date: 01/12/2022  Today's Provider: Ruel Favors, MD   Welcome to Medicare  Subjective:   Richard Mueller is a 66 y.o. male who presents today for his Subsequent Annual Wellness Visit.  Patient/Caregiver input:  no problems   HPI  IPSS Questionnaire (AUA-7): Over the past month.   1)  How often have you had a sensation of not emptying your bladder completely after you finish urinating?  0 - Not at all  2)  How often have you had to urinate again less than two hours after you finished urinating? 0 - Not at all  3)  How often have you found you stopped and started again several times when you urinated?  0 - Not at all  4) How difficult have you found it to postpone urination?  0 - Not at all  5) How often have you had a weak urinary stream?  0 - Not at all  6) How often have you had to push or strain to begin urination?  0 - Not at all  7) How many times did you most typically get up to urinate from the time you went to bed until the time you got up in the morning?  1 - 1 time  Total score:  0-7 mildly symptomatic   8-19 moderately symptomatic   20-35 severely symptomatic     Review of Systems  Constitutional: Negative for fever or weight change.  Respiratory: Negative for cough and shortness of breath.   Cardiovascular: Negative for chest pain or palpitations.  Gastrointestinal: Negative for abdominal pain, no bowel changes.  Musculoskeletal: Negative for gait problem or joint swelling.  Skin: Negative for rash.  Neurological: Negative for dizziness or headache.  No other specific complaints in a complete review of systems (except as listed in HPI above).  Past Medical History:  Diagnosis Date   Allergic rhinitis, cause unspecified    Allergy    Bronchitis    Cancer (HCC) 2020   skin cancer   Hyperlipidemia    Hypertension    Metabolic syndrome    Obesity,  unspecified    Osteoarthritis    Osteoarthrosis, unspecified whether generalized or localized, unspecified site    Pneumonia    Right upper quadrant pain     Past Surgical History:  Procedure Laterality Date   COLONOSCOPY WITH PROPOFOL N/A 08/08/2020   Procedure: COLONOSCOPY WITH PROPOFOL;  Surgeon: Midge Minium, MD;  Location: [facility];  Service: Endoscopy;  Laterality: N/A;  priority 4   LAPAROSCOPIC APPENDECTOMY N/A 11/28/2018   Procedure: APPENDECTOMY LAPAROSCOPIC;  Surgeon: Leafy Ro, MD;  Location: [facility];  Service: General;  Laterality: N/A;   Open Lumbar Five-Sacral One replacement of loose screws, Lumbar Five-Pelvis posterolateral instrumented fusion  09/22/2021   Dr. Maurice Small   ORTHOPEDIC SURGERY     POLYPECTOMY  08/08/2020   Procedure: POLYPECTOMY;  Surgeon: Midge Minium, MD;  Location: [facility];  Service: Endoscopy;;   SKIN CANCER DESTRUCTION Left 03/06/2015   Hand- Dr. Cheree Ditto   SKIN CANCER EXCISION Left 08/2018   Dr. Cheree Ditto on Left Arm    TRANSFORAMINAL LUMBAR INTERBODY FUSION W/ MIS 1 LEVEL Left 10/10/2020   Procedure: Right Lumbar Five Sacral One Minimally invasive transforaminal lumbar interbody fusion;  Surgeon: Jadene Pierini, MD;  Location: [facility];  Service: Neurosurgery;  Laterality: Left;  posterior  Family History  Problem Relation Age of Onset   Heart disease Mother    Heart attack Mother    COPD Mother    Heart disease Father    Hypertension Father    COPD Father    Cancer Father         Prostate and Skin   COPD Sister    Suicidality Brother    CAD Brother    Depression Daughter     Social History   Socioeconomic History   Marital status: Married    Spouse name: Lupita Leash   Number of children: 2   Years of education: Not on file   Highest education level: Some college, no degree  Occupational History   Not on file  Tobacco Use   Smoking status: Former    Packs/day: 1.00    Years: 15.00    Pack years: 15.00     Types: Cigarettes    Start date: 08/24/1987    Quit date: 08/26/2002    Years since quitting: 19.3   Smokeless tobacco: Never  Vaping Use   Vaping Use: Never used  Substance and Sexual Activity   Alcohol use: Yes    Alcohol/week: 0.0 standard drinks    Comment: ocassional   Drug use: No   Sexual activity: Yes    Partners: Female    Birth control/protection: None  Other Topics Concern   Not on file  Social History Narrative   Not on file   Social Determinants of Health   Financial Resource Strain: Low Risk    Difficulty of Paying Living Expenses: Not hard at all  Food Insecurity: No Food Insecurity   Worried About Programme researcher, broadcasting/film/video in the Last Year: Never true   Barista in the Last Year: Never true  Transportation Needs: No Transportation Needs   Lack of Transportation (Medical): No   Lack of Transportation (Non-Medical): No  Physical Activity: Sufficiently Active   Days of Exercise per Week: 5 days   Minutes of Exercise per Session: 120 min  Stress: No Stress Concern Present   Feeling of Stress : Not at all  Social Connections: Socially Integrated   Frequency of Communication with Friends and Family: More than three times a week   Frequency of Social Gatherings with Friends and Family: More than three times a week   Attends Religious Services: More than 4 times per year   Active Member of [employer] or Organizations: Yes   Attends Engineer, structural: More than 4 times per year   Marital Status: Married  Catering manager Violence: Not At Risk   Fear of Current or Ex-Partner: No   Emotionally Abused: No   Physically Abused: No   Sexually Abused: No    Outpatient Encounter Medications as of 01/12/2022  Medication Sig   acetaminophen (TYLENOL) 500 MG tablet Take 1,000 mg by mouth every 6 (six) hours as needed.   Ascorbic Acid (VITAMIN C PO) Take 1 tablet by mouth every other day.   aspirin 81 MG tablet Take 1 tablet (81 mg total) by mouth daily. Okay to  restart on 09/30/21   atorvastatin (LIPITOR) 20 MG tablet Take 1 tablet (20 mg total) by mouth daily.   calcium carbonate (OS-CAL - DOSED IN MG OF ELEMENTAL CALCIUM) 1250 (500 Ca) MG tablet Take 1 tablet by mouth daily.   carvedilol (COREG) 3.125 MG tablet TAKE 1 TABLET BY MOUTH 2 TIMES DAILY.   Cholecalciferol (VITAMIN D) 50 MCG (2000 UT) tablet  Take 2,000 Units by mouth daily.   famotidine (PEPCID) 40 MG tablet Take 1 tablet (40 mg total) by mouth daily.   GINKGO BILOBA PO Take 1 tablet by mouth daily.   losartan (COZAAR) 50 MG tablet TAKE 1 TABLET BY MOUTH DAILY.   MAGNESIUM PO Take 1 tablet by mouth every other day.   montelukast (SINGULAIR) 10 MG tablet Take 1 tablet (10 mg total) by mouth at bedtime.   Multiple Vitamin (MULTIVITAMIN) tablet Take 1 tablet by mouth daily.   naproxen (NAPROSYN) 500 MG tablet TAKE 1 TABLET BY MOUTH TWICE DAILY   Omega-3 Fatty Acids (FISH OIL) 1000 MG CAPS Take 1,000 mg by mouth daily.   [DISCONTINUED] COVID-19 mRNA bivalent vaccine, Pfizer, (PFIZER COVID-19 VAC BIVALENT) injection Inject into the muscle.   [DISCONTINUED] methylPREDNISolone (MEDROL DOSEPAK) 4 MG TBPK tablet Day 1: Take 8 mg (2 tablets) before breakfast, 4 mg (1 tablet) after lunch, 4 mg (1 tablet) after supper, and 8 mg (2 tablets) at bedtime. Day 2:Take 4 mg (1 tablet) before breakfast, 4 mg (1 tablet) after lunch, 4 mg (1 tablet) after supper, and 8 mg (2 tablets) at bedtime. Day 3: Take 4 mg (1 tablet) before breakfast, 4 mg (1 tablet) after lunch, 4 mg (1 tablet) after supper, and 4 mg (1 tablet) at bedtime. Day 4: Take 4 mg (1 tablet) before breakfast, 4 mg (1 tablet) after lunch, and 4 mg (1 tablet) at bedtime. Day 5: Take 4 mg (1 tablet) before breakfast and 4 mg (1 tablet) at bedtime. Day 6: Take 4 mg (1 tablet) before breakfast.   No facility-administered encounter medications on file as of 01/12/2022.    Allergies  Allergen Reactions   Lisinopril Rash    pins and needles in arms     Care Team Updated in EHR: Yes  Last Vision Exam:  Nov 2022. Wears corrective lenses: Yes Dr. Georgianne Fick  Last Dental Exam: every 6 months. Dr. Beckie Busing  Last Hearing Exam: Today Wears Hearing Aids: No  Functional Ability / Safety Screening 1.  Was the timed Get Up and Go test longer than 30 seconds?  yes 2.  Does the patient need help with the phone, transportation, shopping,      preparing meals, housework, laundry, medications, or managing money?  no 3.  Does the patient's home have:  loose throw rugs in the hallway?   no      Grab bars in the bathroom? no      Handrails on the stairs?   yes      Poor lighting?   no 4.  Has the patient noticed any hearing difficulties?   no  Diet Recall and Exercise Regimen: balanced diet   Fall Risk: See screening under Objective Information  Depression Screen: See screening under Objective Information  Advanced Directives: A voluntary discussion about advance care planning including the explanation and discussion of advance directives was discussed with the patient. Explanation about the health care proxy and living will was reviewed.  During this discussion, the patient was able to identify a health care proxy as wife and plans/does not plan to fill out the paperwork required and will bring this to our office to keep on file. Does patient have a HCPOA?    yes If yes, name and contact information: wife  Does patient have a living will or MOST form?  yes  Cancer Screenings: Skin: sees Dr. Kandace Parkins in [facility]  Lung: Low Dose CT Chest recommended if Age 30-80  years, 30 pack-year currently smoking OR have quit w/in 15years. Patient does not qualify.  Lifestyle risk factor issued reviewed: Diet, exercise, weight management, advised patient smoking is not healthy, nutrition/diet.   Prostate: discussed UPSTF Colon: 08/08/20  Additional Screenings:   Hepatitis B/HIV/Syphillis: N/A Hepatitis C Screening: 04/13/15 Intimate Partner Violence:  Negative AAA Screen: Men age 42 to 75 years if ever smoked recommended to get a one time AAA ultrasound screening exam. Patient does qualify  Objective:   Vitals: BP 128/68   Pulse 93   Resp 16   Ht 5\' 9"  (1.753 m)   Wt 215 lb (97.5 kg)   SpO2 98%   BMI 31.75 kg/m  Body mass index is 31.75 kg/m.  Lab Results  Component Value Date   CHOL 128 11/20/2021   CHOL 131 06/18/2020   CHOL 162 12/18/2019   Lab Results  Component Value Date   HDL 50 11/20/2021   HDL 49 06/18/2020   HDL 54 12/18/2019   Lab Results  Component Value Date   LDLCALC 59 11/20/2021   LDLCALC 60 06/18/2020   LDLCALC 73 12/18/2019   Lab Results  Component Value Date   TRIG 102 11/20/2021   TRIG 140 06/18/2020   TRIG 267 (H) 12/18/2019   Lab Results  Component Value Date   CHOLHDL 2.6 11/20/2021   CHOLHDL 2.7 06/18/2020   CHOLHDL 3.0 12/18/2019   No results found for: LDLDIRECT  Hearing Screening   500Hz  1000Hz  2000Hz  4000Hz   Right ear Pass Pass Pass Pass  Left ear Pass Pass Pass Pass   Vision Screening   Right eye Left eye Both eyes  Without correction     With correction 20/25 20/25 20/20     Physical Exam  Constitutional: Patient appears well-developed and well-nourished. Obese  No distress.  HEENT: head atraumatic, normocephalic, pupils equal and reactive to light, neck supple Cardiovascular: Normal rate, regular rhythm and normal heart sounds.  No murmur heard. No BLE edema. Pulmonary/Chest: Effort normal and breath sounds normal. No respiratory distress. Abdominal: Soft.  There is no tenderness. Psychiatric: Patient has a normal mood and affect. behavior is normal. Judgment and thought content normal.   Cognitive Testing - 6-CIT  Correct? Score   What year is it? yes 0 Yes = 0    No = 4  What month is it? yes 0 Yes = 0    No = 3  Remember:     Floyde Parkins, [address], [place]     What time is it? yes 0 Yes = 0    No = 3  Count backwards from 20 to 1 yes 0 Correct = 0    1  error = 2   More than 1 error = 4  Say the months of the year in reverse. yes 0 Correct = 0    1 error = 2   More than 1 error = 4  What address did I ask you to remember? yes 0 Correct = 0  1 error = 2    2 error = 4    3 error = 6    4 error = 8    All wrong = 10       TOTAL SCORE  0/28   Interpretation:  Normal  Normal (0-7) Abnormal (8-28)   Fall Risk:    01/12/2022    2:10 PM 11/25/2021    8:18 AM 10/28/2021    8:01 AM 10/14/2021   10:56 AM 03/17/2021  7:30 AM  Fall Risk   Falls in the past year? 0 0 0 0 0  Number falls in past yr: 0 0 0 0 0  Injury with Fall? 0 0 0 0 0  Risk for fall due to : No Fall Risks  No Fall Risks    Follow up Falls prevention discussed Falls evaluation completed Falls prevention discussed      Depression Screen    01/12/2022    2:10 PM 11/25/2021    8:18 AM 10/28/2021    8:02 AM 10/14/2021   10:59 AM 03/17/2021    7:30 AM  Depression screen PHQ 2/9  Decreased Interest 0 0 0 0 0  Down, Depressed, Hopeless 0 0 0 0 0  PHQ - 2 Score 0 0 0 0 0  Altered sleeping 0  0 0   Tired, decreased energy 0  0 0   Change in appetite 0  0 0   Feeling bad or failure about yourself  0  0 0   Trouble concentrating 0  0 0   Moving slowly or fidgety/restless 0  0 0   Suicidal thoughts 0  0 0   PHQ-9 Score 0  0 0   Difficult doing work/chores   Not difficult at all Not difficult at all     Recent Results (from the past 2160 hour(s))  Lipid panel     Status: None   Collection Time: 11/20/21 10:01 AM  Result Value Ref Range   Cholesterol, Total 128 100 - 199 mg/dL   Triglycerides 295 0 - 149 mg/dL   HDL 50 >28 mg/dL   VLDL Cholesterol Cal 19 5 - 40 mg/dL   LDL Chol Calc (NIH) 59 0 - 99 mg/dL   Chol/HDL Ratio 2.6 0.0 - 5.0 ratio    Comment:                                   T. Chol/HDL Ratio                                             Men  Women                               1/2 Avg.Risk  3.4    3.3                                   Avg.Risk  5.0    4.4                                 2X Avg.Risk  9.6    7.1                                3X Avg.Risk 23.4   11.0   Hepatic function panel     Status: None   Collection Time: 11/20/21 10:01 AM  Result Value Ref Range   Total Protein 6.3 6.0 - 8.5 g/dL   Albumin 4.3 3.8 - 4.8 g/dL   Bilirubin Total 0.4 0.0 - 1.2  mg/dL   Bilirubin, Direct 2.44 0.00 - 0.40 mg/dL   Alkaline Phosphatase 82 44 - 121 IU/L   AST 26 0 - 40 IU/L   ALT 19 0 - 44 IU/L     Assessment & Plan:    1. Welcome to Medicare preventive visit   2. History of tobacco use   Ordered triple A screen   Exercise Activities and Dietary recommendations He will continue regular physical activity   Discussed health benefits of physical activity, and encouraged him to engage in regular exercise appropriate for his age and condition.   Immunization History  Administered Date(s) Administered   Fluad Quad(high Dose 65+) 06/11/2021   Influenza,inj,Quad PF,6+ Mos 05/26/2015, 06/22/2016, 06/21/2017, 06/12/2018, 06/05/2019   Influenza-Unspecified 06/12/2018   PFIZER Comirnaty(Gray Top)Covid-19 Tri-Sucrose Vaccine 01/20/2021   PFIZER(Purple Top)SARS-COV-2 Vaccination 10/23/2019, 11/20/2019, 05/27/2020   PNEUMOCOCCAL CONJUGATE-20 03/17/2021   Pfizer Covid-19 Vaccine Bivalent Booster 74yrs & up 05/15/2021   Pneumococcal Polysaccharide-23 09/21/2016   Tdap 04/12/2012   Zoster Recombinat (Shingrix) 06/18/2020, 08/27/2020   Zoster, Live 12/16/2015    Health Maintenance  Topic Date Due   INFLUENZA VACCINE  03/23/2022   TETANUS/TDAP  04/12/2022   COLONOSCOPY (Pts 45-29yrs Insurance coverage will need to be confirmed)  08/09/2027   Pneumonia Vaccine 62+ Years old  Completed   COVID-19 Vaccine  Completed   Hepatitis C Screening  Completed   Zoster Vaccines- Shingrix  Completed   HPV VACCINES  Aged Out     No orders of the defined types were placed in this encounter.   Current Outpatient Medications:    acetaminophen (TYLENOL) 500 MG  tablet, Take 1,000 mg by mouth every 6 (six) hours as needed., Disp: , Rfl:    Ascorbic Acid (VITAMIN C PO), Take 1 tablet by mouth every other day., Disp: , Rfl:    aspirin 81 MG tablet, Take 1 tablet (81 mg total) by mouth daily. Okay to restart on 09/30/21, Disp: 30 tablet, Rfl:    atorvastatin (LIPITOR) 20 MG tablet, Take 1 tablet (20 mg total) by mouth daily., Disp: 90 tablet, Rfl: 3   calcium carbonate (OS-CAL - DOSED IN MG OF ELEMENTAL CALCIUM) 1250 (500 Ca) MG tablet, Take 1 tablet by mouth daily., Disp: , Rfl:    carvedilol (COREG) 3.125 MG tablet, TAKE 1 TABLET BY MOUTH 2 TIMES DAILY., Disp: 180 tablet, Rfl: 3   Cholecalciferol (VITAMIN D) 50 MCG (2000 UT) tablet, Take 2,000 Units by mouth daily., Disp: , Rfl:    famotidine (PEPCID) 40 MG tablet, Take 1 tablet (40 mg total) by mouth daily., Disp: 90 tablet, Rfl: 3   GINKGO BILOBA PO, Take 1 tablet by mouth daily., Disp: , Rfl:    losartan (COZAAR) 50 MG tablet, TAKE 1 TABLET BY MOUTH DAILY., Disp: 90 tablet, Rfl: 3   MAGNESIUM PO, Take 1 tablet by mouth every other day., Disp: , Rfl:    montelukast (SINGULAIR) 10 MG tablet, Take 1 tablet (10 mg total) by mouth at bedtime., Disp: 30 tablet, Rfl: 3   Multiple Vitamin (MULTIVITAMIN) tablet, Take 1 tablet by mouth daily., Disp: , Rfl:    naproxen (NAPROSYN) 500 MG tablet, TAKE 1 TABLET BY MOUTH TWICE DAILY, Disp: 60 tablet, Rfl: 5   Omega-3 Fatty Acids (FISH OIL) 1000 MG CAPS, Take 1,000 mg by mouth daily., Disp: , Rfl:  Medications Discontinued During This Encounter  Medication Reason   COVID-19 mRNA bivalent vaccine, Pfizer, (PFIZER COVID-19 VAC BIVALENT) injection    methylPREDNISolone (MEDROL DOSEPAK)  4 MG TBPK tablet Completed Course    I have personally reviewed and addressed the Medicare Annual Wellness health risk assessment questionnaire and have noted the following in the patient's chart:  A.         Medical and social history & family history B.         Use of alcohol, tobacco  or illicit drugs  C.         Current medications and supplements D.         Functional and Cognitive ability and status E.         Nutritional status F.         Physical activity G.        Advance directives H.         List of other physicians I.          Hospitalizations, surgeries, and ER visits in previous 12 months J.         Vitals K.         Screenings such as hearing and vision if needed, cognitive and depression L.         Referrals and appointments - triple A Screen   In addition, I have reviewed and discussed with patient certain preventive protocols, quality metrics, and best practice recommendations. A written personalized care plan for preventive services as well as general preventive health recommendations were provided to patient.   See attached scanned questionnaire for additional information.

## 2022-01-12 ENCOUNTER — Ambulatory Visit (INDEPENDENT_AMBULATORY_CARE_PROVIDER_SITE_OTHER): Payer: Medicare Other | Admitting: Family Medicine

## 2022-01-12 ENCOUNTER — Encounter: Payer: Self-pay | Admitting: Family Medicine

## 2022-01-12 ENCOUNTER — Other Ambulatory Visit: Payer: Self-pay

## 2022-01-12 VITALS — BP 128/68 | HR 93 | Resp 16 | Ht 69.0 in | Wt 215.0 lb

## 2022-01-12 DIAGNOSIS — Z0000 Encounter for general adult medical examination without abnormal findings: Secondary | ICD-10-CM | POA: Diagnosis not present

## 2022-01-12 DIAGNOSIS — Z87891 Personal history of nicotine dependence: Secondary | ICD-10-CM

## 2022-01-12 MED ORDER — NAPROXEN 500 MG PO TABS
500.0000 mg | ORAL_TABLET | Freq: Two times a day (BID) | ORAL | 5 refills | Status: DC
  Filled 2022-01-12: qty 60, 30d supply, fill #0
  Filled 2022-08-25 – 2022-10-28 (×2): qty 60, 30d supply, fill #1

## 2022-03-05 ENCOUNTER — Ambulatory Visit: Payer: Medicare Other | Admitting: Internal Medicine

## 2022-03-23 ENCOUNTER — Telehealth: Payer: Self-pay | Admitting: Family Medicine

## 2022-03-23 NOTE — Telephone Encounter (Signed)
Copied from [place]. Topic: General - Other >> Mar 23, 2022  8:49 AM Everette C wrote: Reason for CRM: The patient was billed $103 for their Welcome To Medicare visit from 01/12/22  The patient shares that their wife had the same visit and has the same insurance and was not billed for it   The patient shares that their tym panomotry and Visual function screening were not covered   The patient would like to speak with a member of staff about this discrepancy further

## 2022-03-23 NOTE — Telephone Encounter (Signed)
Sent message to claim department for further review. Upon response from our claim department will reach back out to the patient.

## 2022-04-01 ENCOUNTER — Other Ambulatory Visit: Payer: Self-pay

## 2022-04-13 NOTE — Progress Notes (Unsigned)
Name: Richard Mueller   MRN: [id number]    DOB: 1955/09/09   Date:04/14/2022       Progress Note  Subjective  Chief Complaint  Follow Up  HPI  Right lower back pain: he had a lumbar fusion 09/2020, he had a revision 08/2021 for L5-S1 pseudoarthrosis ,he has some discomfort on lumbar spine He takes naproxen prn , he is able to be active   During evaluation for back problems in Nov 2021 T1 showed a lesion had repeat MRI and was advised to consider short term interval, however he has a metal plate and cannot have MRI at this time. He is still  followed by with Dr. Joaquim Nam . Reminded him to discuss it with him   SVT/CAD/Atherosclerosis of Aorta: he has been on statin, aspirin and carvedilol. Under the care of Dr. Fletcher Anon and LDL is at goal  below 70 - it was . No chest pain, palpitation or  SOB  HTN: he is now on carvedilol and losartan , bp is at goal, no side effects   Adrenal incidentaloma: Left side, he is aware, no symptoms, bp is at goal, normal potassium levels, bp is at goal   Metabolic syndrome: [facility], changed his diet and last A1C was normal at 5.5 % He denies polyphagia, polydipsia or polyuria   Patient Active Problem List   Diagnosis Date Noted   Pseudoarthrosis of lumbar spine 09/22/2021   Spondylolisthesis of lumbar region 10/10/2020   Personal history of colonic polyps    Polyp of sigmoid colon    Elevated coronary artery calcium score 06/18/2020   SVT (supraventricular tachycardia) ([place]) 06/18/2020   Adrenal incidentaloma ([place]) 12/18/2019   Basal cell carcinoma, forehead 08/07/2019   Atherosclerosis of aorta ([place]) 12/06/2018   Degenerative disc disease, lumbar 12/06/2018   Adenoma of left adrenal gland 12/06/2018   PVC (premature ventricular contraction) 12/20/2016   History of squamous cell carcinoma of skin 05/26/2015   Family history of cardiac disorder 03/17/2015   Obesity (BMI 30-39.9) 03/17/2015    Osteoarthrosis 03/17/2015   Perennial allergic rhinitis with seasonal variation 03/17/2015   Hyperlipidemia    Hypertension    Dysmetabolic syndrome 27/10/5007    Past Surgical History:  Procedure Laterality Date   COLONOSCOPY WITH PROPOFOL N/A 08/08/2020   Procedure: COLONOSCOPY WITH PROPOFOL;  Surgeon: [name], MD;  Location: [place];  Service: Endoscopy;  Laterality: N/A;  priority 4   LAPAROSCOPIC APPENDECTOMY N/A 11/28/2018   Procedure: APPENDECTOMY LAPAROSCOPIC;  Surgeon: Jules Husbands, MD;  Location: [facility];  Service: General;  Laterality: N/A;   Open Lumbar Five-Sacral One replacement of loose screws, Lumbar Five-Pelvis posterolateral instrumented fusion  09/22/2021   Dr. Zada Finders   ORTHOPEDIC SURGERY     POLYPECTOMY  08/08/2020   Procedure: POLYPECTOMY;  Surgeon: [name], MD;  Location: [place];  Service: Endoscopy;;   SKIN CANCER DESTRUCTION Left 03/06/2015   Hand- Dr. Phillip Heal   SKIN CANCER EXCISION Left 08/2018   Dr. Phillip Heal on Left Arm    TRANSFORAMINAL LUMBAR INTERBODY FUSION W/ MIS 1 LEVEL Left 10/10/2020   Procedure: Right Lumbar Five Sacral One Minimally invasive transforaminal lumbar interbody fusion;  Surgeon: Judith Part, MD;  Location: [place];  Service: Neurosurgery;  Laterality: Left;  posterior    Family History  Problem Relation Age of Onset   Heart disease Mother    Heart attack Mother    COPD Mother  Heart disease Father    Hypertension Father    COPD Father    Cancer Father         Prostate and Skin   COPD Sister    Suicidality Brother    CAD Brother    Depression Daughter     Social History   Tobacco Use   Smoking status: Former    Packs/day: 1.00    Years: 15.00    Total pack years: 15.00    Types: Cigarettes    Start date: 08/24/1987    Quit date: 08/26/2002    Years since quitting: 19.6   Smokeless tobacco: Never  Substance Use Topics   Alcohol use: Yes    Alcohol/week: 0.0 standard drinks  of alcohol    Comment: ocassional     Current Outpatient Medications:    acetaminophen (TYLENOL) 500 MG tablet, Take 1,000 mg by mouth every 6 (six) hours as needed., Disp: , Rfl:    Ascorbic Acid (VITAMIN C PO), Take 1 tablet by mouth every other day., Disp: , Rfl:    aspirin 81 MG tablet, Take 1 tablet (81 mg total) by mouth daily. Okay to restart on 09/30/21, Disp: 30 tablet, Rfl:    atorvastatin (LIPITOR) 20 MG tablet, Take 1 tablet (20 mg total) by mouth daily., Disp: 90 tablet, Rfl: 3   calcium carbonate (OS-CAL - DOSED IN MG OF ELEMENTAL CALCIUM) 1250 (500 Ca) MG tablet, Take 1 tablet by mouth daily., Disp: , Rfl:    carvedilol (COREG) 3.125 MG tablet, TAKE 1 TABLET BY MOUTH 2 TIMES DAILY., Disp: 180 tablet, Rfl: 3   Cholecalciferol (VITAMIN D) 50 MCG (2000 UT) tablet, Take 2,000 Units by mouth daily., Disp: , Rfl:    GINKGO BILOBA PO, Take 1 tablet by mouth daily., Disp: , Rfl:    losartan (COZAAR) 50 MG tablet, TAKE 1 TABLET BY MOUTH DAILY., Disp: 90 tablet, Rfl: 3   MAGNESIUM PO, Take 1 tablet by mouth every other day., Disp: , Rfl:    Multiple Vitamin (MULTIVITAMIN) tablet, Take 1 tablet by mouth daily., Disp: , Rfl:    naproxen (NAPROSYN) 500 MG tablet, TAKE 1 TABLET BY MOUTH TWICE DAILY, Disp: 60 tablet, Rfl: 5   Omega-3 Fatty Acids (FISH OIL) 1000 MG CAPS, Take 1,000 mg by mouth daily., Disp: , Rfl:    Tdap (ADACEL) 12-22-13.5 LF-MCG/0.5 injection, Inject 0.5 mLs into the muscle once for 1 dose., Disp: 0.5 mL, Rfl: 0  Allergies  Allergen Reactions   Lisinopril Rash    pins and needles in arms    I personally reviewed active problem list, medication list, allergies, family history, social history, health maintenance with the patient/caregiver today.   ROS  Constitutional: Negative for fever or weight change.  Respiratory: Negative for cough and shortness of breath.   Cardiovascular: Negative for chest pain or palpitations.  Gastrointestinal: Negative for abdominal pain, no  bowel changes.  Musculoskeletal: Negative for gait problem or joint swelling.  Skin: Negative for rash.  Neurological: Negative for dizziness or headache.  No other specific complaints in a complete review of systems (except as listed in HPI above).   Objective  Vitals:   04/14/22 0745  BP: 130/78  Pulse: 68  Resp: 16  SpO2: 95%  Weight: 211 lb (95.7 kg)  Height: '5\' 9"'$  (1.753 m)    Body mass index is 31.16 kg/m.  Physical Exam  Constitutional: Patient appears well-developed and well-nourished. Obese  No distress.  HEENT: head atraumatic, normocephalic, pupils equal  and reactive to light, neck supple Cardiovascular: Normal rate, regular rhythm and normal heart sounds.  No murmur heard. No BLE edema. Pulmonary/Chest: Effort normal and breath sounds normal. No respiratory distress. Abdominal: Soft.  There is no tenderness. Psychiatric: Patient has a normal mood and affect. behavior is normal. Judgment and thought content normal.   PHQ2/9:    04/14/2022    7:44 AM 01/12/2022    2:10 PM 11/25/2021    8:18 AM 10/28/2021    8:02 AM 10/14/2021   10:59 AM  Depression screen PHQ 2/9  Decreased Interest 0 0 0 0 0  Down, Depressed, Hopeless 0 0 0 0 0  PHQ - 2 Score 0 0 0 0 0  Altered sleeping 0 0  0 0  Tired, decreased energy 0 0  0 0  Change in appetite 0 0  0 0  Feeling bad or failure about yourself  0 0  0 0  Trouble concentrating 0 0  0 0  Moving slowly or fidgety/restless 0 0  0 0  Suicidal thoughts 0 0  0 0  PHQ-9 Score 0 0  0 0  Difficult doing work/chores    Not difficult at all Not difficult at all    phq 9 is negative   Fall Risk:    04/14/2022    7:44 AM 01/12/2022    2:10 PM 11/25/2021    8:18 AM 10/28/2021    8:01 AM 10/14/2021   10:56 AM  Fall Risk   Falls in the past year? 0 0 0 0 0  Number falls in past yr: 0 0 0 0 0  Injury with Fall? 0 0 0 0 0  Risk for fall due to : No Fall Risks No Fall Risks  No Fall Risks   Follow up Falls prevention discussed Falls  prevention discussed Falls evaluation completed Falls prevention discussed       Functional Status Survey: Is the patient deaf or have difficulty hearing?: No Does the patient have difficulty seeing, even when wearing glasses/contacts?: No Does the patient have difficulty concentrating, remembering, or making decisions?: No Does the patient have difficulty walking or climbing stairs?: No Does the patient have difficulty dressing or bathing?: No Does the patient have difficulty doing errands alone such as visiting a doctor's office or shopping?: No    Assessment & Plan   1. Atherosclerosis of aorta (HCC)  On statin therapy, LDL at goal   2. SVT (supraventricular tachycardia) (H[facility])  No problems lately  3. Adrenal incidentaloma ([place])   4. Need for immunization against influenza  - Flu Vaccine QUAD 6+ mos PF IM (Fluarix Quad PF)  5. Need for Tdap vaccination  - Tdap (ADACEL) 12-22-13.5 LF-MCG/0.5 injection; Inject 0.5 mLs into the muscle once for 1 dose.  Dispense: 0.5 mL; Refill: 0

## 2022-04-14 ENCOUNTER — Ambulatory Visit (INDEPENDENT_AMBULATORY_CARE_PROVIDER_SITE_OTHER): Payer: Medicare Other | Admitting: Family Medicine

## 2022-04-14 ENCOUNTER — Encounter: Payer: Self-pay | Admitting: Family Medicine

## 2022-04-14 ENCOUNTER — Other Ambulatory Visit: Payer: Self-pay

## 2022-04-14 VITALS — BP 130/78 | HR 68 | Resp 16 | Ht 69.0 in | Wt 211.0 lb

## 2022-04-14 DIAGNOSIS — I2510 Atherosclerotic heart disease of native coronary artery without angina pectoris: Secondary | ICD-10-CM

## 2022-04-14 DIAGNOSIS — E278 Other specified disorders of adrenal gland: Secondary | ICD-10-CM | POA: Diagnosis not present

## 2022-04-14 DIAGNOSIS — I700 Atherosclerosis of aorta: Secondary | ICD-10-CM

## 2022-04-14 DIAGNOSIS — I471 Supraventricular tachycardia: Secondary | ICD-10-CM | POA: Diagnosis not present

## 2022-04-14 DIAGNOSIS — Z23 Encounter for immunization: Secondary | ICD-10-CM | POA: Diagnosis not present

## 2022-04-14 MED ORDER — TETANUS-DIPHTH-ACELL PERTUSSIS 5-2-15.5 LF-MCG/0.5 IM SUSP
0.5000 mL | Freq: Once | INTRAMUSCULAR | 0 refills | Status: AC
Start: 2022-04-14 — End: 2022-04-15
  Filled 2022-04-14: qty 0.5, 1d supply, fill #0

## 2022-05-04 ENCOUNTER — Other Ambulatory Visit: Payer: Self-pay

## 2022-06-11 ENCOUNTER — Other Ambulatory Visit: Payer: Self-pay

## 2022-06-11 MED ORDER — COVID-19 MRNA 2023-2024 VACCINE (COMIRNATY) 0.3 ML INJECTION
INTRAMUSCULAR | 0 refills | Status: DC
  Filled 2022-06-11: qty 0.3, 1d supply, fill #0

## 2022-07-12 ENCOUNTER — Other Ambulatory Visit: Payer: Self-pay | Admitting: Family Medicine

## 2022-07-12 DIAGNOSIS — Z87891 Personal history of nicotine dependence: Secondary | ICD-10-CM

## 2022-07-12 DIAGNOSIS — Z0000 Encounter for general adult medical examination without abnormal findings: Secondary | ICD-10-CM

## 2022-07-21 ENCOUNTER — Other Ambulatory Visit: Payer: Self-pay

## 2022-07-29 ENCOUNTER — Ambulatory Visit (INDEPENDENT_AMBULATORY_CARE_PROVIDER_SITE_OTHER): Payer: Medicare Other | Admitting: Family Medicine

## 2022-07-29 ENCOUNTER — Other Ambulatory Visit: Payer: Self-pay

## 2022-07-29 ENCOUNTER — Encounter: Payer: Self-pay | Admitting: Family Medicine

## 2022-07-29 VITALS — BP 122/72 | HR 71 | Temp 98.7°F | Resp 16 | Ht 69.0 in | Wt 215.4 lb

## 2022-07-29 DIAGNOSIS — J40 Bronchitis, not specified as acute or chronic: Secondary | ICD-10-CM | POA: Diagnosis not present

## 2022-07-29 MED ORDER — LEVOCETIRIZINE DIHYDROCHLORIDE 5 MG PO TABS
5.0000 mg | ORAL_TABLET | Freq: Every evening | ORAL | 1 refills | Status: DC
  Filled 2022-07-29: qty 30, 30d supply, fill #0

## 2022-07-29 MED ORDER — PREDNISONE 20 MG PO TABS
40.0000 mg | ORAL_TABLET | Freq: Every day | ORAL | 0 refills | Status: AC
  Filled 2022-07-29: qty 10, 5d supply, fill #0

## 2022-07-29 MED ORDER — IPRATROPIUM BROMIDE 0.03 % NA SOLN
2.0000 | Freq: Two times a day (BID) | NASAL | 1 refills | Status: DC
  Filled 2022-07-29: qty 30, 75d supply, fill #0

## 2022-07-29 MED ORDER — BENZONATATE 100 MG PO CAPS
100.0000 mg | ORAL_CAPSULE | Freq: Three times a day (TID) | ORAL | 1 refills | Status: DC | PRN
  Filled 2022-07-29: qty 60, 10d supply, fill #0

## 2022-07-29 MED ORDER — AZITHROMYCIN 250 MG PO TABS
ORAL_TABLET | ORAL | 0 refills | Status: DC
  Filled 2022-07-29: qty 6, 5d supply, fill #0

## 2022-07-29 NOTE — Progress Notes (Signed)
Patient ID: Richard Mueller, male    DOB: 03-18-1956, 66 y.o.   MRN: [id number]  PCP: Steele Sizer, MD  Chief Complaint  Patient presents with   Nasal Congestion   Cough    Onset for 2 weeks    Subjective:   Richard Mueller is a 66 y.o. male, presents to clinic with CC of the following:  HPI  Patient presents with 2+ weeks of URI symptoms nasal congestion, postnasal drip and persistent cough which is worsening with prior smoking history and bronchitis.    Patient Active Problem List   Diagnosis Date Noted   Pseudoarthrosis of lumbar spine 09/22/2021   Spondylolisthesis of lumbar region 10/10/2020   Personal history of colonic polyps    Polyp of sigmoid colon    Elevated coronary artery calcium score 06/18/2020   SVT (supraventricular tachycardia) 06/18/2020   Adrenal incidentaloma ([place]) 12/18/2019   Basal cell carcinoma, forehead 08/07/2019   Atherosclerosis of aorta ([place]) 12/06/2018   Degenerative disc disease, lumbar 12/06/2018   Adenoma of left adrenal gland 12/06/2018   PVC (premature ventricular contraction) 12/20/2016   History of squamous cell carcinoma of skin 05/26/2015   Family history of cardiac disorder 03/17/2015   Obesity (BMI 30-39.9) 03/17/2015   Osteoarthrosis 03/17/2015   Perennial allergic rhinitis with seasonal variation 03/17/2015   Hyperlipidemia    Hypertension    Dysmetabolic syndrome 62/94/7654      Current Outpatient Medications:    acetaminophen (TYLENOL) 500 MG tablet, Take 1,000 mg by mouth every 6 (six) hours as needed., Disp: , Rfl:    Ascorbic Acid (VITAMIN C PO), Take 1 tablet by mouth every other day., Disp: , Rfl:    aspirin 81 MG tablet, Take 1 tablet (81 mg total) by mouth daily. Okay to restart on 09/30/21, Disp: 30 tablet, Rfl:    atorvastatin (LIPITOR) 20 MG tablet, Take 1 tablet (20 mg total) by mouth daily., Disp: 90 tablet, Rfl: 3   azithromycin (ZITHROMAX) 250 MG tablet, Take 2 tabs (500 mg) PO qd x 1,  then take 1 tab (250 mg) PO qd for day 2-5, Disp: 6 each, Rfl: 0   benzonatate (TESSALON) 100 MG capsule, Take 1-2 capsules (100-200 mg total) by mouth 3 (three) times daily as needed for cough., Disp: 60 capsule, Rfl: 1   calcium carbonate (OS-CAL - DOSED IN MG OF ELEMENTAL CALCIUM) 1250 (500 Ca) MG tablet, Take 1 tablet by mouth daily., Disp: , Rfl:    carvedilol (COREG) 3.125 MG tablet, TAKE 1 TABLET BY MOUTH 2 TIMES DAILY., Disp: 180 tablet, Rfl: 3   Cholecalciferol (VITAMIN D) 50 MCG (2000 UT) tablet, Take 2,000 Units by mouth daily., Disp: , Rfl:    COVID-19 mRNA vaccine 2023-2024 (COMIRNATY) SUSP injection, Inject into the muscle., Disp: 0.3 mL, Rfl: 0   GINKGO BILOBA PO, Take 1 tablet by mouth daily., Disp: , Rfl:    ipratropium (ATROVENT) 0.03 % nasal spray, Place 2 sprays into both nostrils every 12 (twelve) hours., Disp: 30 mL, Rfl: 1   levocetirizine (XYZAL) 5 MG tablet, Take 1 tablet (5 mg total) by mouth every evening., Disp: 30 tablet, Rfl: 1   losartan (COZAAR) 50 MG tablet, TAKE 1 TABLET BY MOUTH DAILY., Disp: 90 tablet, Rfl: 3   MAGNESIUM PO, Take 1 tablet by mouth every other day., Disp: , Rfl:    Multiple Vitamin (MULTIVITAMIN) tablet, Take 1 tablet by mouth daily., Disp: , Rfl:    naproxen (NAPROSYN) 500  MG tablet, TAKE 1 TABLET BY MOUTH TWICE DAILY, Disp: 60 tablet, Rfl: 5   Omega-3 Fatty Acids (FISH OIL) 1000 MG CAPS, Take 1,000 mg by mouth daily., Disp: , Rfl:    predniSONE (DELTASONE) 20 MG tablet, Take 2 tablets (40 mg total) by mouth daily with breakfast for 5 days., Disp: 10 tablet, Rfl: 0   Allergies  Allergen Reactions   Lisinopril Rash    pins and needles in arms     Social History   Tobacco Use   Smoking status: Former    Packs/day: 1.00    Years: 15.00    Total pack years: 15.00    Types: Cigarettes    Start date: 08/24/1987    Quit date: 08/26/2002    Years since quitting: 19.9   Smokeless tobacco: Never  Vaping Use   Vaping Use: Never used   Substance Use Topics   Alcohol use: Yes    Alcohol/week: 0.0 standard drinks of alcohol    Comment: ocassional   Drug use: No      Chart Review Today: I personally reviewed active problem list, medication list, allergies, family history, social history, health maintenance, notes from last encounter, lab results, imaging with the patient/caregiver today.   Review of Systems  Constitutional: Negative.   HENT: Negative.    Eyes: Negative.   Respiratory: Negative.    Cardiovascular: Negative.   Gastrointestinal: Negative.   Endocrine: Negative.   Genitourinary: Negative.   Musculoskeletal: Negative.   Skin: Negative.   Allergic/Immunologic: Negative.   Neurological: Negative.   Hematological: Negative.   Psychiatric/Behavioral: Negative.    All other systems reviewed and are negative.      Objective:   Vitals:   07/29/22 1140  BP: 122/72  Pulse: 71  Resp: 16  Temp: 98.7 F (37.1 C)  TempSrc: Oral  SpO2: 95%  Weight: 215 lb 6.4 oz (97.7 kg)  Height: '5\' 9"'$  (1.753 m)    Body mass index is 31.81 kg/m.  Physical Exam Vitals and nursing note reviewed.  Constitutional:      General: He is not in acute distress.    Appearance: Normal appearance. He is well-developed. He is not toxic-appearing or diaphoretic.  HENT:     Head: Normocephalic and atraumatic.     Jaw: No trismus.     Right Ear: Tympanic membrane, ear canal and external ear normal. There is no impacted cerumen.     Left Ear: Tympanic membrane, ear canal and external ear normal. There is no impacted cerumen.     Nose: Mucosal edema, congestion and rhinorrhea present.     Right Turbinates: Enlarged and swollen.     Left Turbinates: Enlarged and swollen.     Right Sinus: No maxillary sinus tenderness or frontal sinus tenderness.     Left Sinus: No maxillary sinus tenderness or frontal sinus tenderness.     Comments: Profuse nasal discharge and postnasal drip    Mouth/Throat:     Mouth: Mucous membranes  are not pale, not dry and not cyanotic.     Pharynx: Uvula midline. Posterior oropharyngeal erythema present. No oropharyngeal exudate or uvula swelling.     Tonsils: No tonsillar exudate or tonsillar abscesses.  Eyes:     General: Lids are normal.        Right eye: No discharge.        Left eye: No discharge.     Conjunctiva/sclera: Conjunctivae normal.     Pupils: Pupils are equal, round, and reactive to light.  Neck:     Trachea: Trachea and phonation normal. No tracheal deviation.  Cardiovascular:     Rate and Rhythm: Normal rate and regular rhythm.     Pulses:          Radial pulses are 2+ on the right side and 2+ on the left side.     Heart sounds: Normal heart sounds. No murmur heard.    No friction rub. No gallop.  Pulmonary:     Effort: Pulmonary effort is normal. No tachypnea, accessory muscle usage or respiratory distress.     Breath sounds: Normal breath sounds. No stridor. No decreased breath sounds, wheezing, rhonchi or rales.  Abdominal:     General: Bowel sounds are normal. There is no distension.     Palpations: Abdomen is soft.     Tenderness: There is no abdominal tenderness.  Musculoskeletal:        General: Normal range of motion.     Cervical back: Normal range of motion and neck supple.  Skin:    General: Skin is warm and dry.     Capillary Refill: Capillary refill takes less than 2 seconds.     Coloration: Skin is not pale.     Findings: No rash.     Nails: There is no clubbing.  Neurological:     Mental Status: He is alert and oriented to person, place, and time.     Motor: No abnormal muscle tone.     Coordination: Coordination normal.     Gait: Gait normal.  Psychiatric:        Speech: Speech normal.        Behavior: Behavior normal. Behavior is cooperative.      Results for orders placed or performed in visit on 11/20/21  Lipid panel  Result Value Ref Range   Cholesterol, Total 128 100 - 199 mg/dL   Triglycerides 102 0 - 149 mg/dL   HDL 50  >39 mg/dL   VLDL Cholesterol Cal 19 5 - 40 mg/dL   LDL Chol Calc (NIH) 59 0 - 99 mg/dL   Chol/HDL Ratio 2.6 0.0 - 5.0 ratio  Hepatic function panel  Result Value Ref Range   Total Protein 6.3 6.0 - 8.5 g/dL   Albumin 4.3 3.8 - 4.8 g/dL   Bilirubin Total 0.4 0.0 - 1.2 mg/dL   Bilirubin, Direct 0.13 0.00 - 0.40 mg/dL   Alkaline Phosphatase 82 44 - 121 IU/L   AST 26 0 - 40 IU/L   ALT 19 0 - 44 IU/L       Assessment & Plan:     ICD-10-CM   1. Bronchitis  J40 ipratropium (ATROVENT) 0.03 % nasal spray    levocetirizine (XYZAL) 5 MG tablet    predniSONE (DELTASONE) 20 MG tablet    benzonatate (TESSALON) 100 MG capsule    azithromycin (ZITHROMAX) 250 MG tablet    DG Chest 2 View     Patient has had 2+ weeks of URI symptoms with worsening cough particularly nighttime coughing with a lot of congestion and pooling secretions and postnasal drip Did encourage him to work on his nasal symptoms particularly using medications which would dry up his profuse nasal discharge and help reduce the postnasal drip such as Xyzal or other over-the-counter antihistamines, Atrovent nasal sprays or intranasal steroid nasal sprays With his prolonged symptoms and his medical history we will also treat him for acute bronchitis with a burst of prednisone, Z-Pak for its anti-inflammatory properties and also benzonatate if he  has any worsening he was encouraged to go get the chest x-ray -at this time I do not hear any crackles and he is not having severe fatigue or dyspnea and I do not suspect pneumonia but if his symptoms do not improve I would like him to get a chest x-ray and follow-up in office     Delsa Grana, PA-C 07/29/22 11:54 AM

## 2022-07-30 ENCOUNTER — Other Ambulatory Visit: Payer: Self-pay

## 2022-08-02 ENCOUNTER — Encounter: Payer: Self-pay | Admitting: Family Medicine

## 2022-08-10 IMAGING — CT CT HEART MORP W/ CTA COR W/ SCORE W/ CA W/CM &/OR W/O CM
1 of 14 series · 3 of 20 positions shown, 4 images · non-contrast
Comparison: No priors.

Addendum:
CLINICAL DATA: Chest pain

EXAM:
Cardiac/Coronary  CTA
TECHNIQUE: The patient was scanned on a Siemens Somatoform go.Top scanner.

[Series 44: ms multiphase cta coronary 0.60 · axial · 0.40mm/px · z∈[+1571,+1621]mm · 3 of 2259 slices shown, 4 images]
[im 565/2259  vessel]
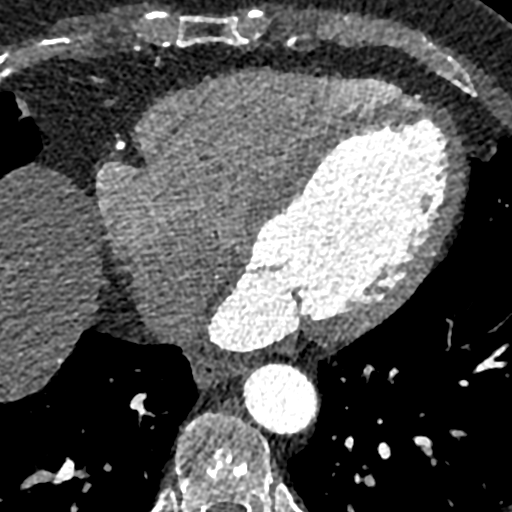
[im 565/2259  lung]
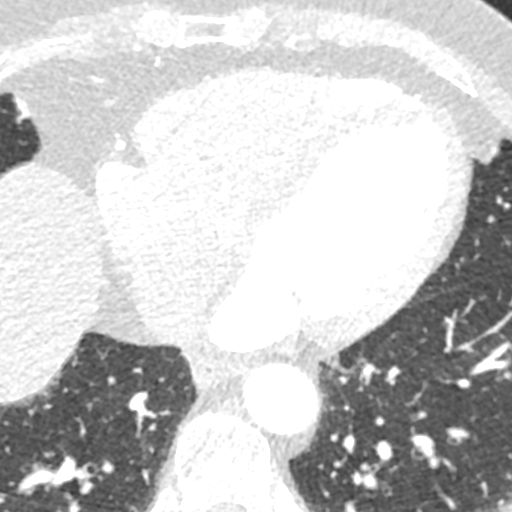
[im 1130/2259  vessel]
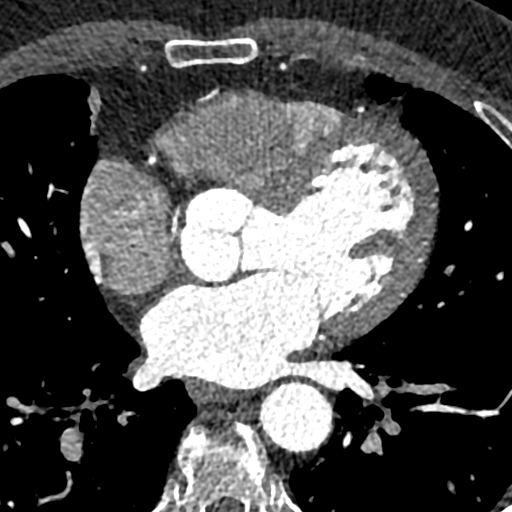
[im 1694/2259  vessel]
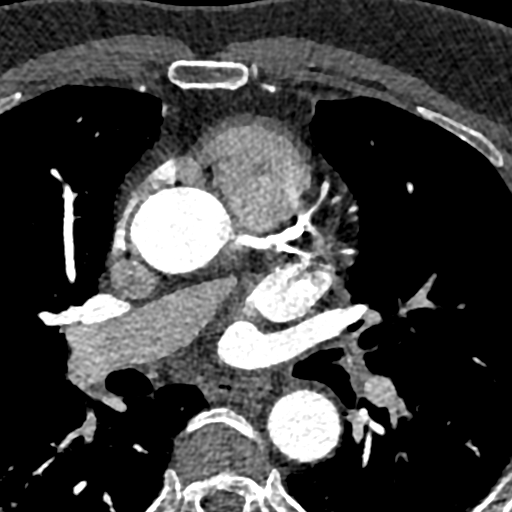

[3 of 20 positions shown; findings below may reference images not displayed]

FINDINGS: A retrospective scan was triggered in the descending thoracic aorta.
Axial non-contrast 3 mm slices were carried out through the heart.
The data set was analyzed on a dedicated work station and scored
using the Agatson method. Gantry rotation speed was 330 msecs and
collimation was .6 mm. 50mg of metoprolol and 0.8 mg of sl NTG was
given. The 3D data set was reconstructed in 5% intervals of the
60-95 % of the R-R cycle. Diastolic phases were analyzed on a
dedicated work station using MPR, MIP and VRT modes. The patient
received 100 cc of contrast.

Aorta: Normal size. Mild aortic root calcifications. No dissection.

Aortic Valve:  Trileaflet.  No calcifications.

Coronary Arteries:  Normal coronary origin.  Right dominance.

RCA is a large dominant artery that gives rise to PDA and PLA. There
is mild calcified plaque in the mid and distal RCA causing minimal
stenosis (0-24%).

Left main is a large artery that gives rise to LAD and LCX arteries.
There is calcified plaque in the distal LM causing mild stenosis
(25%)

LAD is a large vessel that has both calcified and non calcified
plaque in its proximal segment causing moderate stenosis (50-69%).

LCX is a non-dominant artery that gives rise to two obtuse marginal
branches. There is calcified plaque in the proximal segment causing
mild stenosis (25-49%).

Other findings:

Normal pulmonary vein drainage into the left atrium.

Normal left atrial appendage without a thrombus.

Normal size of the pulmonary artery.
IMPRESSION: 1. High coronary calcium score of 857. This was 91st percentile for
age and sex matched control (MESA data).

2. Normal coronary origin with right dominance.

3. Calcified and non calcified plaque in the proximal LAD causing
moderate stenosis.

4. Calcified plaque in the proximal LCx and distal LM causing mild
stenosis

5. CAD-RADS 3. Moderate stenosis. Consider symptom-guided
anti-ischemic pharmacotherapy as well as risk factor modification
per guideline directed care.

Additional analysis with CT FFR will be submitted and reported
separately.

EXAM:
OVER-READ INTERPRETATION  CT CHEST

The following report is an over-read performed by radiologist Dr.
over-read does not include interpretation of cardiac or coronary
anatomy or pathology. The coronary calcium score and cardiac CTA
interpretation by the cardiologist is attached.
FINDINGS: Aortic atherosclerosis. Within the visualized portions of the thorax
there are no suspicious appearing pulmonary nodules or masses, there
is no acute consolidative airspace disease, no pleural effusions, no
pneumothorax and no lymphadenopathy. Visualized portions of the
upper abdomen are unremarkable. There are no aggressive appearing
lytic or blastic lesions noted in the visualized portions of the
skeleton.
IMPRESSION: 1.  Aortic Atherosclerosis ([id number]).

*** End of Addendum ***
FINDINGS: A retrospective scan was triggered in the descending thoracic aorta.
Axial non-contrast 3 mm slices were carried out through the heart.
The data set was analyzed on a dedicated work station and scored
using the Agatson method. Gantry rotation speed was 330 msecs and
collimation was .6 mm. 50mg of metoprolol and 0.8 mg of sl NTG was
given. The 3D data set was reconstructed in 5% intervals of the
60-95 % of the R-R cycle. Diastolic phases were analyzed on a
dedicated work station using MPR, MIP and VRT modes. The patient
received 100 cc of contrast.

Aorta: Normal size. Mild aortic root calcifications. No dissection.

Aortic Valve:  Trileaflet.  No calcifications.

Coronary Arteries:  Normal coronary origin.  Right dominance.

RCA is a large dominant artery that gives rise to PDA and PLA. There
is mild calcified plaque in the mid and distal RCA causing minimal
stenosis (0-24%).

Left main is a large artery that gives rise to LAD and LCX arteries.
There is calcified plaque in the distal LM causing mild stenosis
(25%)

LAD is a large vessel that has both calcified and non calcified
plaque in its proximal segment causing moderate stenosis (50-69%).

LCX is a non-dominant artery that gives rise to two obtuse marginal
branches. There is calcified plaque in the proximal segment causing
mild stenosis (25-49%).

Other findings:

Normal pulmonary vein drainage into the left atrium.

Normal left atrial appendage without a thrombus.

Normal size of the pulmonary artery.
IMPRESSION: 1. High coronary calcium score of 857. This was 91st percentile for
age and sex matched control (MESA data).

2. Normal coronary origin with right dominance.

3. Calcified and non calcified plaque in the proximal LAD causing
moderate stenosis.

4. Calcified plaque in the proximal LCx and distal LM causing mild
stenosis

5. CAD-RADS 3. Moderate stenosis. Consider symptom-guided
anti-ischemic pharmacotherapy as well as risk factor modification
per guideline directed care.

Additional analysis with CT FFR will be submitted and reported
separately.

## 2022-08-25 ENCOUNTER — Other Ambulatory Visit: Payer: Self-pay

## 2022-10-05 ENCOUNTER — Ambulatory Visit (INDEPENDENT_AMBULATORY_CARE_PROVIDER_SITE_OTHER): Payer: Medicare Other

## 2022-10-05 DIAGNOSIS — Z23 Encounter for immunization: Secondary | ICD-10-CM

## 2022-10-05 NOTE — Progress Notes (Signed)
Patient presented to clinic and stated he was in good health today with no reports of fever or other distressor. He received a T-dap vaccination per his request and under the advice of Dr. Ancil Boozer following vaccination information and follow up care instructions were given. Patient tolerated the injection well with no immediate side effects noted.

## 2022-10-15 ENCOUNTER — Other Ambulatory Visit: Payer: Self-pay

## 2022-10-26 ENCOUNTER — Telehealth: Payer: Self-pay | Admitting: Family Medicine

## 2022-10-26 NOTE — Telephone Encounter (Signed)
[place] to schedule their annual wellness visit. Appointment made for 01/14/2023.  [place] Direct Dial: [phone]

## 2022-10-26 NOTE — Telephone Encounter (Signed)
Called patient to schedule Medicare Annual Wellness Visit (AWV). Left message for patient to call back and schedule Medicare Annual Wellness Visit (AWV).  Last date of AWV: 01/12/2022  Please schedule an appointment at any time with [facility].  If any questions, please contact me at [phone].  Thank you ,  [place] Direct Dial: [phone]

## 2022-10-28 ENCOUNTER — Other Ambulatory Visit: Payer: Self-pay

## 2022-11-22 ENCOUNTER — Other Ambulatory Visit: Payer: Self-pay | Admitting: Cardiovascular Disease

## 2022-11-22 ENCOUNTER — Other Ambulatory Visit: Payer: Self-pay

## 2022-11-22 MED ORDER — CARVEDILOL 3.125 MG PO TABS
3.1250 mg | ORAL_TABLET | Freq: Two times a day (BID) | ORAL | 1 refills | Status: DC
  Filled 2022-11-22: qty 60, fill #0
  Filled 2022-12-29: qty 60, 30d supply, fill #0
  Filled 2023-02-11: qty 60, 30d supply, fill #1

## 2022-11-26 ENCOUNTER — Ambulatory Visit: Payer: Medicare Other | Admitting: Cardiovascular Disease

## 2022-12-09 ENCOUNTER — Other Ambulatory Visit: Payer: Self-pay | Admitting: Cardiovascular Disease

## 2022-12-09 ENCOUNTER — Other Ambulatory Visit: Payer: Self-pay

## 2022-12-09 DIAGNOSIS — I10 Essential (primary) hypertension: Secondary | ICD-10-CM

## 2022-12-09 MED ORDER — LOSARTAN POTASSIUM 50 MG PO TABS
50.0000 mg | ORAL_TABLET | Freq: Every day | ORAL | 0 refills | Status: DC
  Filled 2022-12-09: qty 30, fill #0
  Filled 2022-12-29: qty 30, 30d supply, fill #0

## 2022-12-14 ENCOUNTER — Other Ambulatory Visit: Payer: Self-pay | Admitting: Cardiovascular Disease

## 2022-12-14 ENCOUNTER — Other Ambulatory Visit: Payer: Self-pay

## 2022-12-14 MED ORDER — CLOBETASOL PROPIONATE 0.05 % EX CREA
1.0000 | TOPICAL_CREAM | Freq: Two times a day (BID) | CUTANEOUS | 0 refills | Status: DC
  Filled 2022-12-14: qty 15, 14d supply, fill #0

## 2022-12-14 MED ORDER — ATORVASTATIN CALCIUM 20 MG PO TABS
20.0000 mg | ORAL_TABLET | Freq: Every day | ORAL | 0 refills | Status: DC
  Filled 2022-12-14: qty 90, 90d supply, fill #0

## 2022-12-15 ENCOUNTER — Other Ambulatory Visit: Payer: Self-pay

## 2022-12-29 ENCOUNTER — Other Ambulatory Visit: Payer: Self-pay

## 2023-01-05 ENCOUNTER — Other Ambulatory Visit: Payer: Self-pay | Admitting: Neurological Surgery

## 2023-01-05 DIAGNOSIS — M960 Pseudarthrosis after fusion or arthrodesis: Secondary | ICD-10-CM

## 2023-01-07 ENCOUNTER — Encounter: Payer: Self-pay | Admitting: Cardiovascular Disease

## 2023-01-07 ENCOUNTER — Ambulatory Visit: Payer: Medicare Other | Attending: Cardiovascular Disease | Admitting: Cardiovascular Disease

## 2023-01-07 VITALS — BP 140/76 | HR 67 | Ht 68.0 in | Wt 218.5 lb

## 2023-01-07 DIAGNOSIS — I10 Essential (primary) hypertension: Secondary | ICD-10-CM | POA: Diagnosis not present

## 2023-01-07 DIAGNOSIS — R002 Palpitations: Secondary | ICD-10-CM | POA: Diagnosis not present

## 2023-01-07 DIAGNOSIS — I2510 Atherosclerotic heart disease of native coronary artery without angina pectoris: Secondary | ICD-10-CM | POA: Insufficient documentation

## 2023-01-07 DIAGNOSIS — E785 Hyperlipidemia, unspecified: Secondary | ICD-10-CM | POA: Diagnosis not present

## 2023-01-07 NOTE — Progress Notes (Signed)
Cardiology Office Note   Date:  01/07/2023   ID:  Richard Mueller, DOB [birth year], MRN [id number]  PCP:  Alba Cory, MD  Cardiologist:   Lorine Bears, MD   Chief Complaint  Patient presents with   Follow-up    12 month f/u no complaints today. Meds reviewed verbally with pt.      History of Present Illness: Richard Mueller is a 67 y.o. male who presents for a follow-up regarding moderate nonobstructive coronary artery disease and palpitations.  He does have a family history of CAD.  He is known to have aortic atherosclerosis on previous CT imaging. He has history of palpitations.  ZIO monitor in 2021 showed 1 episode of wide-complex tachycardia lasting only 4 beats and was likely due to SVT with aberrancy although slow nonsustained ventricular tachycardia could not be excluded.  He was also noted to have short runs of SVT the longest lasted 16 beats.  Echocardiogram in August 2021 showed normal LV systolic function with mild mitral regurgitation and no significant pulmonary hypertension.  Cardiac CTA in September 2021 showed a calcium score of 857 with evidence of moderate stenosis in the proximal LAD as well as mild left circumflex disease.  None of the lesions were significant by FFR.  He had lumbar spine surgery twice and continues to struggle with low back pain.  He denies chest pain or shortness of breath.  He is not able to exercise as much as he would like due to his back issues.   Past Medical History:  Diagnosis Date   Allergic rhinitis, cause unspecified    Allergy    Bronchitis    Cancer (HCC) 2020   skin cancer   Hyperlipidemia    Hypertension    Metabolic syndrome    Obesity, unspecified    Osteoarthritis    Osteoarthrosis, unspecified whether generalized or localized, unspecified site    Pneumonia    Right upper quadrant pain     Past Surgical History:  Procedure Laterality Date   COLONOSCOPY WITH PROPOFOL N/A 08/08/2020    Procedure: COLONOSCOPY WITH PROPOFOL;  Surgeon: Midge Minium, MD;  Location: [facility];  Service: Endoscopy;  Laterality: N/A;  priority 4   LAPAROSCOPIC APPENDECTOMY N/A 11/28/2018   Procedure: APPENDECTOMY LAPAROSCOPIC;  Surgeon: Leafy Ro, MD;  Location: [facility];  Service: General;  Laterality: N/A;   Open Lumbar Five-Sacral One replacement of loose screws, Lumbar Five-Pelvis posterolateral instrumented fusion  09/22/2021   Dr. Maurice Small   ORTHOPEDIC SURGERY     POLYPECTOMY  08/08/2020   Procedure: POLYPECTOMY;  Surgeon: Midge Minium, MD;  Location: [facility];  Service: Endoscopy;;   SKIN CANCER DESTRUCTION Left 03/06/2015   Hand- Dr. Cheree Ditto   SKIN CANCER EXCISION Left 08/2018   Dr. Cheree Ditto on Left Arm    TRANSFORAMINAL LUMBAR INTERBODY FUSION W/ MIS 1 LEVEL Left 10/10/2020   Procedure: Right Lumbar Five Sacral One Minimally invasive transforaminal lumbar interbody fusion;  Surgeon: Jadene Pierini, MD;  [facility];  Service: Neurosurgery;  Laterality: Left;  posterior     Current Outpatient Medications  Medication Sig Dispense Refill   acetaminophen (TYLENOL) 500 MG tablet Take 1,000 mg by mouth every 6 (six) hours as needed.     Ascorbic Acid (VITAMIN C PO) Take 1 tablet by mouth every other day.     aspirin 81 MG tablet Take 1 tablet (81 mg total) by mouth daily. Okay to restart on 09/30/21 30 tablet  atorvastatin (LIPITOR) 20 MG tablet Take 1 tablet (20 mg total) by mouth daily. 90 tablet 0   BERBERINE CHLORIDE PO Take 2 mg by mouth daily in the afternoon.     calcium carbonate (OS-CAL - DOSED IN MG OF ELEMENTAL CALCIUM) 1250 (500 Ca) MG tablet Take 1 tablet by mouth daily.     carvedilol (COREG) 3.125 MG tablet Take 1 tablet (3.125 mg total) by mouth 2 (two) times daily. 60 tablet 1   Cholecalciferol (VITAMIN D) 50 MCG (2000 UT) tablet Take 2,000 Units by mouth daily.     clobetasol cream (TEMOVATE) 0.05 % Apply 1 Application topically 2 (two)  times daily for 1 to 2 weeks. 15 g 0   COVID-19 mRNA vaccine 2023-2024 (COMIRNATY) SUSP injection Inject into the muscle. 0.3 mL 0   levocetirizine (XYZAL) 5 MG tablet Take 1 tablet (5 mg total) by mouth every evening. 30 tablet 1   losartan (COZAAR) 50 MG tablet Take 1 tablet (50 mg total) by mouth daily. 30 tablet 0   MAGNESIUM PO Take 1 tablet by mouth every other day.     Multiple Vitamin (MULTIVITAMIN) tablet Take 1 tablet by mouth daily.     naproxen (NAPROSYN) 500 MG tablet TAKE 1 TABLET BY MOUTH TWICE DAILY 60 tablet 5   No current facility-administered medications for this visit.    Allergies:   Lisinopril    Social History:  The patient  reports that he quit smoking about 20 years ago. His smoking use included cigarettes. He started smoking about 35 years ago. He has a 15.00 pack-year smoking history. He has never used smokeless tobacco. He reports current alcohol use. He reports that he does not use drugs.   Family History:  The patient's family history includes CAD in his brother; COPD in his father, mother, and sister; Cancer in his father; Depression in his daughter; Heart attack in his mother; Heart disease in his father and mother; Hypertension in his father; Suicidality in his brother.    ROS:  Please see the history of present illness.   Otherwise, review of systems are positive for none.   All other systems are reviewed and negative.    PHYSICAL EXAM: VS:  BP (!) 140/76 (BP Location: Left Arm, Patient Position: Sitting, Cuff Size: Large)   Pulse 67   Ht 5\' 8"  (1.727 m)   Wt 218 lb 8 oz (99.1 kg)   SpO2 97%   BMI 33.22 kg/m  , BMI Body mass index is 33.22 kg/m. GEN: Well nourished, well developed, in no acute distress  HEENT: normal  Neck: no JVD, carotid bruits, or masses Cardiac: RRR; no murmurs, rubs, or gallops,no edema  Respiratory:  clear to auscultation bilaterally, normal work of breathing GI: soft, nontender, nondistended, + BS MS: no deformity or  atrophy  Skin: warm and dry, no rash Neuro:  Strength and sensation are intact Psych: euthymic mood, full affect   EKG:  EKG is ordered today. The ekg ordered today demonstrates normal sinus rhythm with left axis deviation and minimal LVH.   Recent Labs: No results found for requested labs within last 365 days.    Lipid Panel    Component Value Date/Time   CHOL 128 11/20/2021 1001   TRIG 102 11/20/2021 1001   TRIG 108 02/27/2013 1609   HDL 50 11/20/2021 1001   HDL 42 02/27/2013 1609   CHOLHDL 2.6 11/20/2021 1001   CHOLHDL 2.7 06/18/2020 0834   VLDL 14 11/27/2018 1657  LDLCALC 59 11/20/2021 1001   LDLCALC 60 06/18/2020 0834   LDLCALC 76 02/27/2013 1609      Wt Readings from Last 3 Encounters:  01/07/23 218 lb 8 oz (99.1 kg)  07/29/22 215 lb 6.4 oz (97.7 kg)  04/14/22 211 lb (95.7 kg)        ASSESSMENT AND PLAN:   1.  Coronary artery disease involving native coronary arteries without angina: Known moderate nonobstructive coronary artery disease on previous CTA.  Currently with no anginal symptoms.  I discussed with him the importance of healthy lifestyle changes.  Continue low-dose aspirin.    2.  Palpitations: Short runs of SVT noted on previous monitor.  Symptoms are well controlled on small dose carvedilol.  3. Hyperlipidemia: Continue atorvastatin.  Most recent lipid profile in March of last year showed an LDL of 59.  He is going for physical next week with his primary care physician and will await the results of blood work from that visit.   4. Essential hypertension: Blood pressure is borderline elevated but his blood pressure is usually well-controlled.  Continue carvedilol and losartan.  Suspect that his back pain is contributing to some of elevated blood pressure.  I discussed with him the importance of healthy lifestyle changes.     Disposition:   FU with me in 1 year  Signed,  Lorine Bears, MD  01/07/2023 9:02 AM    [facility]

## 2023-01-07 NOTE — Patient Instructions (Signed)
Medication Instructions:  No changes *If you need a refill on your cardiac medications before your next appointment, please call your pharmacy*   Lab Work: None ordered If you have labs (blood work) drawn today and your tests are completely normal, you will receive your results only by: MyChart Message (if you have MyChart) OR A paper copy in the mail If you have any lab test that is abnormal or we need to change your treatment, we will call you to review the results.   Testing/Procedures: None ordered   Follow-Up: At [facility], you and your health needs are our priority.  As part of our continuing mission to provide you with exceptional heart care, we have created designated Provider Care Teams.  These Care Teams include your primary Cardiologist (physician) and Advanced Practice Providers (APPs -  Physician Assistants and Nurse Practitioners) who all work together to provide you with the care you need, when you need it.  We recommend signing up for the patient portal called "MyChart".  Sign up information is provided on this After Visit Summary.  MyChart is used to connect with patients for Virtual Visits (Telemedicine).  Patients are able to view lab/test results, encounter notes, upcoming appointments, etc.  Non-urgent messages can be sent to your provider as well.   To learn more about what you can do with MyChart, go to [email or web address].    Your next appointment:   12 month(s)  Provider:   You may see Dr. Arida or one of the following Advanced Practice Providers on your designated Care Team:   Christopher Berge, NP Ryan Dunn, PA-C Cadence Furth, PA-C Sheri Hammock, NP    

## 2023-01-10 ENCOUNTER — Ambulatory Visit
Admission: RE | Admit: 2023-01-10 | Discharge: 2023-01-10 | Disposition: A | Discharge status: Home / Self Care | Payer: Medicare Other | Source: Ambulatory Visit | Attending: Neurological Surgery | Admitting: Neurological Surgery

## 2023-01-10 ENCOUNTER — Encounter: Payer: Self-pay | Admitting: Family Medicine

## 2023-01-10 DIAGNOSIS — M960 Pseudarthrosis after fusion or arthrodesis: Secondary | ICD-10-CM

## 2023-01-14 ENCOUNTER — Ambulatory Visit: Payer: Medicare Other

## 2023-02-03 ENCOUNTER — Ambulatory Visit (INDEPENDENT_AMBULATORY_CARE_PROVIDER_SITE_OTHER): Payer: Medicare Other

## 2023-02-03 VITALS — Ht 68.0 in | Wt 214.1 lb

## 2023-02-03 DIAGNOSIS — Z0000 Encounter for general adult medical examination without abnormal findings: Secondary | ICD-10-CM

## 2023-02-03 NOTE — Patient Instructions (Signed)
Richard Mueller , Thank you for taking time to come for your Medicare Wellness Visit. I appreciate your ongoing commitment to your health goals. Please review the following plan we discussed and let me know if I can assist you in the future.   These are the goals we discussed:  Goals   None     This is a list of the screening recommended for you and due dates:  Health Maintenance  Topic Date Due   COVID-19 Vaccine (6 - 2023-24 season) 04/23/2022   Flu Shot  03/24/2023   Medicare Annual Wellness Visit  02/03/2024   Colon Cancer Screening  08/09/2027   DTaP/Tdap/Td vaccine (3 - Td or Tdap) 10/05/2032   Pneumonia Vaccine  Completed   Hepatitis C Screening  Completed   Zoster (Shingles) Vaccine  Completed   HPV Vaccine  Aged Out    Advanced directives: yes  Conditions/risks identified: none  Next appointment: Follow up in one year for your annual wellness visit. 02/09/2024 @ 1:30pm in person  Preventive Care 65 Years and Older, Male  Preventive care refers to lifestyle choices and visits with your health care provider that can promote health and wellness. What does preventive care include? A yearly physical exam. This is also called an annual well check. Dental exams once or twice a year. Routine eye exams. Ask your health care provider how often you should have your eyes checked. Personal lifestyle choices, including: Daily care of your teeth and gums. Regular physical activity. Eating a healthy diet. Avoiding tobacco and drug use. Limiting alcohol use. Practicing safe sex. Taking low doses of aspirin every day. Taking vitamin and mineral supplements as recommended by your health care provider. What happens during an annual well check? The services and screenings done by your health care provider during your annual well check will depend on your age, overall health, lifestyle risk factors, and family history of disease. Counseling  Your health care provider may ask you  questions about your: Alcohol use. Tobacco use. Drug use. Emotional well-being. Home and relationship well-being. Sexual activity. Eating habits. History of falls. Memory and ability to understand (cognition). Work and work Astronomer. Screening  You may have the following tests or measurements: Height, weight, and BMI. Blood pressure. Lipid and cholesterol levels. These may be checked every 5 years, or more frequently if you are over 74 years old. Skin check. Lung cancer screening. You may have this screening every year starting at age 35 if you have a 30-pack-year history of smoking and currently smoke or have quit within the past 15 years. Fecal occult blood test (FOBT) of the stool. You may have this test every year starting at age 61. Flexible sigmoidoscopy or colonoscopy. You may have a sigmoidoscopy every 5 years or a colonoscopy every 10 years starting at age 58. Prostate cancer screening. Recommendations will vary depending on your family history and other risks. Hepatitis C blood test. Hepatitis B blood test. Sexually transmitted disease (STD) testing. Diabetes screening. This is done by checking your blood sugar (glucose) after you have not eaten for a while (fasting). You may have this done every 1-3 years. Abdominal aortic aneurysm (AAA) screening. You may need this if you are a current or former smoker. Osteoporosis. You may be screened starting at age 78 if you are at high risk. Talk with your health care provider about your test results, treatment options, and if necessary, the need for more tests. Vaccines  Your health care provider may recommend certain vaccines,  such as: Influenza vaccine. This is recommended every year. Tetanus, diphtheria, and acellular pertussis (Tdap, Td) vaccine. You may need a Td booster every 10 years. Zoster vaccine. You may need this after age 28. Pneumococcal 13-valent conjugate (PCV13) vaccine. One dose is recommended after age  80. Pneumococcal polysaccharide (PPSV23) vaccine. One dose is recommended after age 66. Talk to your health care provider about which screenings and vaccines you need and how often you need them. This information is not intended to replace advice given to you by your health care provider. Make sure you discuss any questions you have with your health care provider. Document Released: 09/05/2015 Document Revised: 04/28/2016 Document Reviewed: 06/10/2015 Elsevier Interactive Patient Education  2017 [place] Prevention in the Home Falls can cause injuries. They can happen to people of all ages. There are many things you can do to make your home safe and to help prevent falls. What can I do on the outside of my home? Regularly fix the edges of walkways and driveways and fix any cracks. Remove anything that might make you trip as you walk through a door, such as a raised step or threshold. Trim any bushes or trees on the path to your home. Use bright outdoor lighting. Clear any walking paths of anything that might make someone trip, such as rocks or tools. Regularly check to see if handrails are loose or broken. Make sure that both sides of any steps have handrails. Any raised decks and porches should have guardrails on the edges. Have any leaves, snow, or ice cleared regularly. Use sand or salt on walking paths during winter. Clean up any spills in your garage right away. This includes oil or grease spills. What can I do in the bathroom? Use night lights. Install grab bars by the toilet and in the tub and shower. Do not use towel bars as grab bars. Use non-skid mats or decals in the tub or shower. If you need to sit down in the shower, use a plastic, non-slip stool. Keep the floor dry. Clean up any water that spills on the floor as soon as it happens. Remove soap buildup in the tub or shower regularly. Attach bath mats securely with double-sided non-slip rug tape. Do not have throw  rugs and other things on the floor that can make you trip. What can I do in the bedroom? Use night lights. Make sure that you have a light by your bed that is easy to reach. Do not use any sheets or blankets that are too big for your bed. They should not hang down onto the floor. Have a firm chair that has side arms. You can use this for support while you get dressed. Do not have throw rugs and other things on the floor that can make you trip. What can I do in the kitchen? Clean up any spills right away. Avoid walking on wet floors. Keep items that you use a lot in easy-to-reach places. If you need to reach something above you, use a strong step stool that has a grab bar. Keep electrical cords out of the way. Do not use floor polish or wax that makes floors slippery. If you must use wax, use non-skid floor wax. Do not have throw rugs and other things on the floor that can make you trip. What can I do with my stairs? Do not leave any items on the stairs. Make sure that there are handrails on both sides of the stairs and  use them. Fix handrails that are broken or loose. Make sure that handrails are as long as the stairways. Check any carpeting to make sure that it is firmly attached to the stairs. Fix any carpet that is loose or worn. Avoid having throw rugs at the top or bottom of the stairs. If you do have throw rugs, attach them to the floor with carpet tape. Make sure that you have a light switch at the top of the stairs and the bottom of the stairs. If you do not have them, ask someone to add them for you. What else can I do to help prevent falls? Wear shoes that: Do not have high heels. Have rubber bottoms. Are comfortable and fit you well. Are closed at the toe. Do not wear sandals. If you use a stepladder: Make sure that it is fully opened. Do not climb a closed stepladder. Make sure that both sides of the stepladder are locked into place. Ask someone to hold it for you, if  possible. Clearly mark and make sure that you can see: Any grab bars or handrails. First and last steps. Where the edge of each step is. Use tools that help you move around (mobility aids) if they are needed. These include: Canes. Walkers. Scooters. Crutches. Turn on the lights when you go into a dark area. Replace any light bulbs as soon as they burn out. Set up your furniture so you have a clear path. Avoid moving your furniture around. If any of your floors are uneven, fix them. If there are any pets around you, be aware of where they are. Review your medicines with your doctor. Some medicines can make you feel dizzy. This can increase your chance of falling. Ask your doctor what other things that you can do to help prevent falls. This information is not intended to replace advice given to you by your health care provider. Make sure you discuss any questions you have with your health care provider. Document Released: 06/05/2009 Document Revised: 01/15/2016 Document Reviewed: 09/13/2014 Elsevier Interactive Patient Education  2017 [employer].

## 2023-02-03 NOTE — Progress Notes (Signed)
Subjective:   Richard Mueller [facility] III is a 67 y.o. male who presents for an Initial Medicare Annual Wellness Visit.  Review of Systems    Cardiac Risk Factors include: advanced age (>58men, >8 women);dyslipidemia;hypertension;male gender;obesity (BMI >30kg/m2)    Objective:    Today's Vitals   02/03/23 1337  Weight: 214 lb 1.6 oz (97.1 kg)  Height: 5\' 8"  (1.727 m)   Body mass index is 32.55 kg/m.     02/03/2023    1:45 PM 09/16/2021    8:20 AM 10/10/2020    3:05 PM 10/08/2020    8:47 AM 08/08/2020    9:16 AM 04/05/2017   10:34 AM 12/20/2016    7:54 AM  Advanced Directives  Does Patient Have a Medical Advance Directive? Yes Yes Yes Yes Yes Yes Yes  Type of Estate agent of [place];Living will Healthcare Power of [employer] of [place];Living will Healthcare Power of [place];Living will Healthcare Power of [place];Living will Healthcare Power of [place];Living will Healthcare Power of [place];Living will  Does patient want to make changes to medical advance directive?  No - Patient declined No - Patient declined  No - Patient declined    Copy of Healthcare Power of Attorney in Chart?  No - copy requested No - copy requested No - copy requested Yes - validated most recent copy scanned in chart (See row information)      Current Medications (verified) Outpatient Encounter Medications as of 02/03/2023  Medication Sig   acetaminophen (TYLENOL) 500 MG tablet Take 1,000 mg by mouth every 6 (six) hours as needed.   Ascorbic Acid (VITAMIN C PO) Take 1 tablet by mouth every other day.   aspirin 81 MG tablet Take 1 tablet (81 mg total) by mouth daily. Okay to restart on 09/30/21   atorvastatin (LIPITOR) 20 MG tablet Take 1 tablet (20 mg total) by mouth daily.   BERBERINE CHLORIDE PO Take 2 mg by mouth daily in the afternoon.   carvedilol (COREG) 3.125 MG tablet Take 1 tablet (3.125 mg total) by mouth 2 (two) times daily.   Cholecalciferol (VITAMIN D)  50 MCG (2000 UT) tablet Take 2,000 Units by mouth daily.   clobetasol cream (TEMOVATE) 0.05 % Apply 1 Application topically 2 (two) times daily for 1 to 2 weeks.   COVID-19 mRNA vaccine 2023-2024 (COMIRNATY) SUSP injection Inject into the muscle.   levocetirizine (XYZAL) 5 MG tablet Take 1 tablet (5 mg total) by mouth every evening.   losartan (COZAAR) 50 MG tablet Take 1 tablet (50 mg total) by mouth daily.   MAGNESIUM PO Take 1 tablet by mouth every other day.   Multiple Vitamin (MULTIVITAMIN) tablet Take 1 tablet by mouth daily.   naproxen (NAPROSYN) 500 MG tablet TAKE 1 TABLET BY MOUTH TWICE DAILY   calcium carbonate (OS-CAL - DOSED IN MG OF ELEMENTAL CALCIUM) 1250 (500 Ca) MG tablet Take 1 tablet by mouth daily.   No facility-administered encounter medications on file as of 02/03/2023.    Allergies (verified) Lisinopril   History: Past Medical History:  Diagnosis Date   Allergic rhinitis, cause unspecified    Allergy    Bronchitis    Cancer (HCC) 2020   skin cancer   Hyperlipidemia    Hypertension    Metabolic syndrome    Obesity, unspecified    Osteoarthritis    Osteoarthrosis, unspecified whether generalized or localized, unspecified site    Pneumonia    Right upper quadrant pain    Past Surgical  History:  Procedure Laterality Date   APPENDECTOMY  April 2020   COLONOSCOPY WITH PROPOFOL N/A 08/08/2020   Procedure: COLONOSCOPY WITH PROPOFOL;  Surgeon: Midge Minium, MD;  Location: [facility];  Service: Endoscopy;  Laterality: N/A;  priority 4   LAPAROSCOPIC APPENDECTOMY N/A 11/28/2018   Procedure: APPENDECTOMY LAPAROSCOPIC;  Surgeon: Leafy Ro, MD;  Location: [facility];  Service: General;  Laterality: N/A;   Open Lumbar Five-Sacral One replacement of loose screws, Lumbar Five-Pelvis posterolateral instrumented fusion  09/22/2021   Dr. Maurice Small   ORTHOPEDIC SURGERY     POLYPECTOMY  08/08/2020   Procedure: POLYPECTOMY;  Surgeon: Midge Minium, MD;  Location:  [facility];  Service: Endoscopy;;   SKIN CANCER DESTRUCTION Left 03/06/2015   Hand- Dr. Cheree Ditto   SKIN CANCER EXCISION Left 08/2018   Dr. Cheree Ditto on Left Arm    SPINE SURGERY  February 2023   TRANSFORAMINAL LUMBAR INTERBODY FUSION W/ MIS 1 LEVEL Left 10/10/2020   Procedure: Right Lumbar Five Sacral One Minimally invasive transforaminal lumbar interbody fusion;  Surgeon: Jadene Pierini, MD;  Location: [facility] OR;  Service: Neurosurgery;  Laterality: Left;  posterior   Family History  Problem Relation Age of Onset   Heart disease Mother    Heart attack Mother    COPD Mother    Heart disease Father    Hypertension Father    COPD Father    Cancer Father         Prostate and Skin   COPD Sister    Suicidality Brother    CAD Brother    Depression Daughter    Social History   Socioeconomic History   Marital status: Married    Spouse name: Lupita Leash   Number of children: 2   Years of education: Not on file   Highest education level: Some college, no degree  Occupational History   Not on file  Tobacco Use   Smoking status: Former    Packs/day: 1.00    Years: 15.00    Additional pack years: 0.00    Total pack years: 15.00    Types: Cigarettes    Start date: 08/24/1987    Quit date: 08/26/2002    Years since quitting: 20.4   Smokeless tobacco: Never  Vaping Use   Vaping Use: Never used  Substance and Sexual Activity   Alcohol use: Yes    Comment: Couple of beer's a week socially   Drug use: No   Sexual activity: Yes    Partners: Female    Birth control/protection: None  Other Topics Concern   Not on file  Social History Narrative   Not on file   Social Determinants of Health   Financial Resource Strain: Low Risk  (02/03/2023)   Overall Financial Resource Strain (CARDIA)    Difficulty of Paying Living Expenses: Not hard at all  Food Insecurity: No Food Insecurity (02/03/2023)   Hunger Vital Sign    Worried About Running Out of Food in the Last Year: Never true     Ran Out of Food in the Last Year: Never true  Transportation Needs: No Transportation Needs (02/03/2023)   PRAPARE - Administrator, Civil Service (Medical): No    Lack of Transportation (Non-Medical): No  Physical Activity: Sufficiently Active (02/03/2023)   Exercise Vital Sign    Days of Exercise per Week: 5 days    Minutes of Exercise per Session: 120 min  Stress: No Stress Concern Present (02/03/2023)   [employer]  of Occupational Health - Occupational Stress Questionnaire    Feeling of Stress : Not at all  Social Connections: Socially Integrated (02/03/2023)   Social Connection and Isolation Panel [NHANES]    Frequency of Communication with Friends and Family: More than three times a week    Frequency of Social Gatherings with Friends and Family: More than three times a week    Attends Religious Services: More than 4 times per year    Active Member of [employer] or Organizations: Yes    Attends Engineer, structural: More than 4 times per year    Marital Status: Married    Tobacco Counseling Counseling given: Not Answered   Clinical Intake:  Pre-visit preparation completed: Yes  Pain : No/denies pain     BMI - recorded: 32.55 Nutritional Status: BMI > 30  Obese Nutritional Risks: None Diabetes: No  How often do you need to have someone help you when you read instructions, pamphlets, or other written materials from your doctor or pharmacy?: 1 - Never  Diabetic?no  Interpreter Needed?: No  Comments: lives with wife Information entered by :: B.Climmie Cronce,LPN   Activities of Daily Living    02/03/2023    1:45 PM 07/29/2022   10:59 AM  In your present state of health, do you have any difficulty performing the following activities:  Hearing? 1 0  Vision? 0 0  Difficulty concentrating or making decisions? 0 0  Walking or climbing stairs? 0 0  Dressing or bathing? 0 0  Doing errands, shopping? 0 0  Preparing Food and eating ? N   Using the  Toilet? N   In the past six months, have you accidently leaked urine? N   Do you have problems with loss of bowel control? N   Managing your Medications? N   Managing your Finances? N   Housekeeping or managing your Housekeeping? N     Patient Care Team: Alba Cory, MD as PCP - General (Family Medicine) Jadene Pierini, MD as Consulting Physician (Neurosurgery) Midge Minium, MD as Consulting Physician (Gastroenterology) Leafy Ro, MD as Consulting Physician (General Surgery) Iran Ouch, MD as Consulting Physician (Cardiology) Sherlyn Lees Lavella Hammock, MD as Referring Physician (Ophthalmology)  Indicate any recent Medical Services you may have received from other than [facility] providers in the past year (date may be approximate).     Assessment:   This is a routine wellness examination for Richard Mueller.  Hearing/Vision screen Hearing Screening - Comments:: Adequate hearing Vision Screening - Comments:: Adequate vision glasses Dr Braulio Bosch eye   Dietary issues and exercise activities discussed: Current Exercise Habits: Home exercise routine, Type of exercise: walking, Time (Minutes): > 60, Frequency (Times/Week): 5, Weekly Exercise (Minutes/Week): 0, Intensity: Mild, Exercise limited by: cardiac condition(s);orthopedic condition(s)   Goals Addressed   None    Depression Screen    02/03/2023    1:43 PM 07/29/2022   10:59 AM 04/14/2022    7:44 AM 01/12/2022    2:10 PM 11/25/2021    8:18 AM 10/28/2021    8:02 AM 10/14/2021   10:59 AM  PHQ 2/9 Scores  PHQ - 2 Score 0 0 0 0 0 0 0  PHQ- 9 Score  0 0 0  0 0    Fall Risk    02/03/2023    1:39 PM 07/29/2022   10:59 AM 04/14/2022    7:44 AM 01/12/2022    2:10 PM 11/25/2021    8:18 AM  Fall Risk   Falls in the  past year? 0 0 0 0 0  Number falls in past yr: 0 0 0 0 0  Injury with Fall? 0 0 0 0 0  Risk for fall due to :  No Fall Risks No Fall Risks No Fall Risks   Follow up Education provided;Falls prevention discussed  Falls prevention discussed;Education provided;Falls evaluation completed Falls prevention discussed Falls prevention discussed Falls evaluation completed    FALL RISK PREVENTION PERTAINING TO THE HOME:  Any stairs in or around the home? Yes  If so, are there any without handrails? Yes  Home free of loose throw rugs in walkways, pet beds, electrical cords, etc? Yes  Adequate lighting in your home to reduce risk of falls? Yes   ASSISTIVE DEVICES UTILIZED TO PREVENT FALLS:  Life alert? No  Use of a cane, walker or w/c? No  Grab bars in the bathroom? No  Shower chair or bench in shower? No  Elevated toilet seat or a handicapped toilet? No   TIMED UP AND GO:  Was the test performed? Yes .  Length of time to ambulate 10 feet: 8 sec.   Gait steady and fast with assistive device  Cognitive Function:        02/03/2023    1:47 PM  6CIT Screen  What Year? 0 points  What month? 0 points  What time? 0 points  Count back from 20 0 points  Months in reverse 0 points  Repeat phrase 0 points  Total Score 0 points    Immunizations Immunization History  Administered Date(s) Administered   Fluad Quad(high Dose 65+) 06/11/2021   Influenza,inj,Quad PF,6+ Mos 05/26/2015, 06/22/2016, 06/21/2017, 06/12/2018, 06/05/2019, 04/14/2022   Influenza-Unspecified 06/12/2018   PFIZER Comirnaty(Gray Top)Covid-19 Tri-Sucrose Vaccine 01/20/2021   PFIZER(Purple Top)SARS-COV-2 Vaccination 10/23/2019, 11/20/2019, 05/27/2020   PNEUMOCOCCAL CONJUGATE-20 03/17/2021   Pfizer Covid-19 Vaccine Bivalent Booster 58yrs & up 05/15/2021   Pneumococcal Polysaccharide-23 09/21/2016   Tdap 04/12/2012, 10/05/2022   Zoster Recombinat (Shingrix) 06/18/2020, 08/27/2020   Zoster, Live 12/16/2015    TDAP status: Up to date  Flu Vaccine status: Up to date  Pneumococcal vaccine status: Up to date  Covid-19 vaccine status: Completed vaccines  Qualifies for Shingles Vaccine? Yes   Zostavax completed Yes   Shingrix  Completed?: Yes  Screening Tests Health Maintenance  Topic Date Due   COVID-19 Vaccine (6 - 2023-24 season) 04/23/2022   INFLUENZA VACCINE  03/24/2023   Medicare Annual Wellness (AWV)  02/03/2024   Colonoscopy  08/09/2027   DTaP/Tdap/Td (3 - Td or Tdap) 10/05/2032   Pneumonia Vaccine 25+ Years old  Completed   Hepatitis C Screening  Completed   Zoster Vaccines- Shingrix  Completed   HPV VACCINES  Aged Out    Health Maintenance  Health Maintenance Due  Topic Date Due   COVID-19 Vaccine (6 - 2023-24 season) 04/23/2022    Colorectal cancer screening: Type of screening: Colonoscopy. Completed yes. Repeat every 5 years  Lung Cancer Screening: (Low Dose CT Chest recommended if Age 29-80 years, 30 pack-year currently smoking OR have quit w/in 15years.) does not qualify.   Lung Cancer Screening Referral: no  Additional Screening:  Hepatitis C Screening: does not qualify; Completed yes  Vision Screening: Recommended annual ophthalmology exams for early detection of glaucoma and other disorders of the eye. Is the patient up to date with their annual eye exam?  Yes  Who is the provider or what is the name of the office in which the patient attends annual eye exams? Dr Georgianne Fick  If pt is not established with a provider, would they like to be referred to a provider to establish care? No .   Dental Screening: Recommended annual dental exams for proper oral hygiene  Community Resource Referral / Chronic Care Management: CRR required this visit?  No   CCM required this visit?  No      Plan:     I have personally reviewed and noted the following in the patient's chart:   Medical and social history Use of alcohol, tobacco or illicit drugs  Current medications and supplements including opioid prescriptions. Patient is not currently taking opioid prescriptions. Functional ability and status Nutritional status Physical activity Advanced directives List of other  physicians Hospitalizations, surgeries, and ER visits in previous 12 months Vitals Screenings to include cognitive, depression, and falls Referrals and appointments  In addition, I have reviewed and discussed with patient certain preventive protocols, quality metrics, and best practice recommendations. A written personalized care plan for preventive services as well as general preventive health recommendations were provided to patient.     Sue Lush, LPN   1/61/0960   Nurse Notes: The patient states he is doing well and has no concerns or questions at this time.

## 2023-02-11 ENCOUNTER — Other Ambulatory Visit: Payer: Self-pay | Admitting: Cardiovascular Disease

## 2023-02-11 ENCOUNTER — Other Ambulatory Visit: Payer: Self-pay

## 2023-02-11 DIAGNOSIS — I10 Essential (primary) hypertension: Secondary | ICD-10-CM

## 2023-02-11 MED ORDER — LOSARTAN POTASSIUM 50 MG PO TABS
50.0000 mg | ORAL_TABLET | Freq: Every day | ORAL | 3 refills | Status: DC
Start: 2023-02-11 — End: 2023-02-16
  Filled 2023-02-11: qty 30, 30d supply, fill #0

## 2023-02-11 MED ORDER — CARVEDILOL 3.125 MG PO TABS
3.1250 mg | ORAL_TABLET | Freq: Two times a day (BID) | ORAL | 3 refills | Status: DC
  Filled 2023-02-11: qty 60, 30d supply, fill #0

## 2023-02-16 ENCOUNTER — Other Ambulatory Visit: Payer: Self-pay

## 2023-02-16 DIAGNOSIS — I10 Essential (primary) hypertension: Secondary | ICD-10-CM

## 2023-02-16 MED ORDER — ATORVASTATIN CALCIUM 20 MG PO TABS
20.0000 mg | ORAL_TABLET | Freq: Every day | ORAL | 3 refills | Status: DC
  Filled 2023-02-16 – 2023-02-28 (×2): qty 90, 90d supply, fill #0
  Filled 2023-03-21: qty 90, 90d supply, fill #1

## 2023-02-16 MED ORDER — CARVEDILOL 3.125 MG PO TABS
3.1250 mg | ORAL_TABLET | Freq: Two times a day (BID) | ORAL | 3 refills | Status: DC
  Filled 2023-02-16 – 2023-03-21 (×2): qty 180, 90d supply, fill #0

## 2023-02-16 MED ORDER — LOSARTAN POTASSIUM 50 MG PO TABS
50.0000 mg | ORAL_TABLET | Freq: Every day | ORAL | 3 refills | Status: DC
Start: 2023-02-16 — End: 2023-12-22
  Filled 2023-02-16 – 2023-03-21 (×2): qty 90, 90d supply, fill #0

## 2023-02-21 ENCOUNTER — Encounter: Payer: Self-pay | Admitting: Family Medicine

## 2023-02-28 ENCOUNTER — Other Ambulatory Visit: Payer: Self-pay

## 2023-03-21 ENCOUNTER — Other Ambulatory Visit: Payer: Self-pay

## 2023-04-01 ENCOUNTER — Other Ambulatory Visit: Payer: Self-pay

## 2023-04-06 ENCOUNTER — Other Ambulatory Visit: Payer: Self-pay

## 2023-04-06 MED ORDER — TACROLIMUS 0.1 % EX OINT
1.0000 | TOPICAL_OINTMENT | Freq: Two times a day (BID) | CUTANEOUS | 1 refills | Status: DC | PRN
  Filled 2023-04-06: qty 30, 30d supply, fill #0

## 2023-04-06 MED ORDER — CLOBETASOL PROPIONATE 0.05 % EX CREA
1.0000 | TOPICAL_CREAM | Freq: Two times a day (BID) | CUTANEOUS | 1 refills | Status: DC
  Filled 2023-04-06: qty 45, 90d supply, fill #0

## 2023-04-18 ENCOUNTER — Other Ambulatory Visit: Payer: Self-pay

## 2023-04-20 ENCOUNTER — Other Ambulatory Visit: Payer: Self-pay

## 2023-06-08 ENCOUNTER — Ambulatory Visit: Payer: Medicare Other

## 2023-06-08 ENCOUNTER — Encounter: Payer: Self-pay | Admitting: Family Medicine

## 2023-06-10 ENCOUNTER — Other Ambulatory Visit: Payer: Self-pay

## 2023-07-17 ENCOUNTER — Other Ambulatory Visit: Payer: Self-pay | Admitting: Medical Genetics

## 2023-07-17 DIAGNOSIS — Z006 Encounter for examination for normal comparison and control in clinical research program: Secondary | ICD-10-CM

## 2023-07-18 ENCOUNTER — Other Ambulatory Visit
Admission: RE | Admit: 2023-07-18 | Discharge: 2023-07-18 | Disposition: A | Discharge status: Home / Self Care | Payer: Self-pay | Source: Ambulatory Visit | Attending: Medical Genetics | Admitting: Medical Genetics

## 2023-07-18 DIAGNOSIS — Z006 Encounter for examination for normal comparison and control in clinical research program: Secondary | ICD-10-CM

## 2023-07-27 ENCOUNTER — Other Ambulatory Visit: Payer: Self-pay

## 2023-08-01 LAB — GENECONNECT MOLECULAR SCREEN: Genetic Analysis Overall Interpretation: NEGATIVE

## 2023-08-03 ENCOUNTER — Other Ambulatory Visit: Payer: Self-pay

## 2023-08-03 MED ORDER — CLOBETASOL PROPIONATE 0.05 % EX CREA
1.0000 | TOPICAL_CREAM | Freq: Two times a day (BID) | CUTANEOUS | 0 refills | Status: DC
  Filled 2023-08-03: qty 45, 90d supply, fill #0

## 2023-09-01 ENCOUNTER — Encounter: Payer: Self-pay | Admitting: Cardiovascular Disease

## 2023-09-01 DIAGNOSIS — I10 Essential (primary) hypertension: Secondary | ICD-10-CM

## 2023-09-01 DIAGNOSIS — E785 Hyperlipidemia, unspecified: Secondary | ICD-10-CM

## 2023-09-06 ENCOUNTER — Other Ambulatory Visit
Admission: RE | Admit: 2023-09-06 | Discharge: 2023-09-06 | Disposition: A | Discharge status: Home / Self Care | Payer: Medicare Other | Attending: Cardiovascular Disease | Admitting: Cardiovascular Disease

## 2023-09-06 DIAGNOSIS — E785 Hyperlipidemia, unspecified: Secondary | ICD-10-CM | POA: Insufficient documentation

## 2023-09-06 DIAGNOSIS — I10 Essential (primary) hypertension: Secondary | ICD-10-CM | POA: Insufficient documentation

## 2023-09-06 LAB — LIPID PANEL
Cholesterol: 155 mg/dL (ref 0–200)
HDL: 50 mg/dL (ref >40)
LDL Cholesterol: 92 mg/dL (ref 0–99)
Total CHOL/HDL Ratio: 3.1 {ratio}
Triglycerides: 65 mg/dL (ref <150)
VLDL: 13 mg/dL (ref 0–40)

## 2023-09-06 LAB — COMPREHENSIVE METABOLIC PANEL
ALT: 21 U/L (ref 0–44)
AST: 20 U/L (ref 15–41)
Albumin: 3.8 g/dL (ref 3.5–5.0)
Alkaline Phosphatase: 49 U/L (ref 38–126)
Anion gap: 8 (ref 5–15)
BUN: 20 mg/dL (ref 8–23)
CO2: 22 mmol/L (ref 22–32)
Calcium: 8.7 mg/dL — ABNORMAL LOW (ref 8.9–10.3)
Chloride: 108 mmol/L (ref 98–111)
Creatinine, Ser: 0.88 mg/dL (ref 0.61–1.24)
GFR, Estimated: >60 mL/min (ref >60)
Glucose, Bld: 99 mg/dL (ref 70–99)
Potassium: 4.4 mmol/L (ref 3.5–5.1)
Sodium: 138 mmol/L (ref 135–145)
Total Bilirubin: 0.9 mg/dL (ref 0.0–1.2)
Total Protein: 7 g/dL (ref 6.5–8.1)

## 2023-09-06 LAB — CBC
HCT: 37.8 % — ABNORMAL LOW (ref 39.0–52.0)
Hemoglobin: 13 g/dL (ref 13.0–17.0)
MCH: 30.9 pg (ref 26.0–34.0)
MCHC: 34.4 g/dL (ref 30.0–36.0)
MCV: 89.8 fL (ref 80.0–100.0)
Platelets: 189 10*3/uL (ref 150–400)
RBC: 4.21 MIL/uL — ABNORMAL LOW (ref 4.22–5.81)
RDW: 13 % (ref 11.5–15.5)
WBC: 4.4 10*3/uL (ref 4.0–10.5)
nRBC: 0 % (ref 0.0–0.2)

## 2023-09-06 NOTE — Addendum Note (Signed)
 Addended by: Lonell Face C on: 09/06/2023 07:46 AM   Modules accepted: Orders

## 2023-09-06 NOTE — Addendum Note (Signed)
 Addended by: Lonell Face C on: 09/06/2023 07:45 AM   Modules accepted: Orders

## 2023-09-09 ENCOUNTER — Telehealth: Payer: Self-pay | Admitting: *Deleted

## 2023-09-09 LAB — HM DIABETES EYE EXAM

## 2023-09-09 MED ORDER — ATORVASTATIN CALCIUM 40 MG PO TABS: 40.0000 mg | ORAL_TABLET | Freq: Every day | ORAL | 3 refills | Status: DC

## 2023-09-09 NOTE — Telephone Encounter (Signed)
Patient made aware of results and verbalized understanding.  He was concerned about his trending decline of the calcium. He stated that he was taken off of the calcium supplement due to increased calcification in his arteries. He would like to know if Dr. Kirke Corin feels that she should go back on the supplement.

## 2023-09-09 NOTE — Telephone Encounter (Signed)
-----   Message from [place] sent at 09/09/2023  1:04 PM EST ----- Inform patient that labs were normal. Cholesterol was good but LDL was 92 and should be less than 70.  Recommend increasing atorvastatin to 40 mg daily.

## 2023-09-14 NOTE — Telephone Encounter (Signed)
Calcium supplements can increase the risk of heart disease.  He can increase his dietary calcium intake instead such as dairy products.  He should also discuss with his primary care physician.

## 2023-09-14 NOTE — Telephone Encounter (Signed)
The patient has been made aware.  

## 2023-11-08 ENCOUNTER — Other Ambulatory Visit: Payer: Self-pay

## 2023-11-08 MED ORDER — CLOBETASOL PROPIONATE 0.05 % EX CREA
1.0000 | TOPICAL_CREAM | Freq: Two times a day (BID) | CUTANEOUS | 0 refills | Status: DC
  Filled 2023-11-08: qty 45, 90d supply, fill #0

## 2023-11-10 ENCOUNTER — Ambulatory Visit: Admitting: Family Medicine

## 2023-11-10 ENCOUNTER — Encounter (INDEPENDENT_AMBULATORY_CARE_PROVIDER_SITE_OTHER): Admitting: Family Medicine

## 2023-11-10 ENCOUNTER — Encounter: Payer: Self-pay | Admitting: Family Medicine

## 2023-11-10 DIAGNOSIS — I2510 Atherosclerotic heart disease of native coronary artery without angina pectoris: Secondary | ICD-10-CM | POA: Insufficient documentation

## 2023-11-10 NOTE — Progress Notes (Signed)
 Canceled, family emergency and had to leave the office right away

## 2023-11-17 ENCOUNTER — Ambulatory Visit: Admitting: Family Medicine

## 2023-11-27 ENCOUNTER — Encounter: Payer: Self-pay | Admitting: Cardiovascular Disease

## 2023-11-28 ENCOUNTER — Encounter: Payer: Self-pay | Admitting: Family Medicine

## 2023-11-29 ENCOUNTER — Encounter: Payer: Self-pay | Admitting: Cardiovascular Disease

## 2023-12-05 ENCOUNTER — Encounter: Payer: Self-pay | Admitting: Family Medicine

## 2023-12-22 ENCOUNTER — Ambulatory Visit (INDEPENDENT_AMBULATORY_CARE_PROVIDER_SITE_OTHER): Admitting: Family Medicine

## 2023-12-22 ENCOUNTER — Encounter: Payer: Self-pay | Admitting: Family Medicine

## 2023-12-22 ENCOUNTER — Telehealth: Payer: Self-pay | Admitting: Family Medicine

## 2023-12-22 VITALS — BP 126/70 | HR 65 | Resp 16 | Ht 68.0 in | Wt 215.9 lb

## 2023-12-22 DIAGNOSIS — M545 Low back pain, unspecified: Secondary | ICD-10-CM

## 2023-12-22 DIAGNOSIS — N528 Other male erectile dysfunction: Secondary | ICD-10-CM | POA: Diagnosis not present

## 2023-12-22 DIAGNOSIS — I471 Supraventricular tachycardia, unspecified: Secondary | ICD-10-CM

## 2023-12-22 DIAGNOSIS — M79604 Pain in right leg: Secondary | ICD-10-CM

## 2023-12-22 DIAGNOSIS — E278 Other specified disorders of adrenal gland: Secondary | ICD-10-CM | POA: Diagnosis not present

## 2023-12-22 DIAGNOSIS — I700 Atherosclerosis of aorta: Secondary | ICD-10-CM | POA: Diagnosis not present

## 2023-12-22 DIAGNOSIS — E8881 Metabolic syndrome: Secondary | ICD-10-CM

## 2023-12-22 DIAGNOSIS — I10 Essential (primary) hypertension: Secondary | ICD-10-CM

## 2023-12-22 MED ORDER — TADALAFIL 5 MG PO TABS: 5.0000 mg | ORAL_TABLET | Freq: Every day | ORAL | 0 refills | Status: DC

## 2023-12-22 MED ORDER — METHYLPREDNISOLONE 4 MG PO TBPK: ORAL_TABLET | ORAL | 0 refills | Status: DC

## 2023-12-22 MED ORDER — CARVEDILOL 3.125 MG PO TABS: 3.1250 mg | ORAL_TABLET | Freq: Two times a day (BID) | ORAL | 3 refills | Status: AC

## 2023-12-22 MED ORDER — LOSARTAN POTASSIUM 50 MG PO TABS
50.0000 mg | ORAL_TABLET | Freq: Every day | ORAL | 3 refills | Status: DC
Start: 2023-12-22 — End: 2024-06-13

## 2023-12-22 NOTE — Telephone Encounter (Signed)
 tadalafil  (CIALIS ) 5 MG tablet   Key: [facility]

## 2023-12-22 NOTE — Telephone Encounter (Signed)
 PA DENIED

## 2023-12-22 NOTE — Progress Notes (Signed)
 Name: Richard Mueller   MRN: [id number]    DOB: 1955/09/13   Date:12/22/2023       Progress Note  Subjective  Chief Complaint  Chief Complaint  Patient presents with   Medical Management of Chronic Issues   Discussed the use of AI scribe software for clinical note transcription with the patient, who gave verbal consent to proceed.  History of Present Illness Richard Doege Mueller "[name]" is a 68 year old male who presents with erectile dysfunction. He is accompanied by his wife.  He has been experiencing difficulties with both initiating and maintaining an erection. He has not previously explored treatment options and is considering starting a daily low-dose medication for erectile dysfunction. He is interested in understanding the differences between daily and as-needed dosing options.  He has a history of coronary artery disease and supraventricular tachycardia. He is currently taking atorvastatin  40 mg for cholesterol management, losartan  50 mg for blood pressure, and carvedilol  for heart rate control. No current chest pain or palpitations. His last cholesterol check showed an LDL of 92. He also has a history of hyperglycemia and metabolic syndrome.   He has lumbar spine issues, including disc narrowing and arthritis at multiple levels, with the right L5 being the worst. He experiences back pain that radiates down his right leg, especially after physical activities like working on a chicken coop. He occasionally takes naproxen  for pain management, prescribed by his back doctor, and uses clobetasol  and protopic  creams for eczema.  He has a history of an adrenal incidentaloma, which was stable at 1.5 cm as of November 2022. A CT scan in 2022 showed no aortic aneurysm but did reveal atherosclerosis of the aorta. Dr Dana Duncan released him from his care since stable   He has recently acquired hearing aids for both ears, which have improved his hearing.    Patient Active Problem List    Diagnosis Date Noted   Coronary artery disease due to lipid rich plaque 11/10/2023   Pseudoarthrosis of lumbar spine 09/22/2021   Spondylolisthesis of lumbar region 10/10/2020   History of colonic polyps    Polyp of sigmoid colon    Elevated coronary artery calcium  score 06/18/2020   SVT (supraventricular tachycardia) (HCC) 06/18/2020   Adrenal incidentaloma (HCC) 12/18/2019   Basal cell carcinoma, forehead 08/07/2019   Atherosclerosis of aorta (HCC) 12/06/2018   Degenerative disc disease, lumbar 12/06/2018   Adenoma of left adrenal gland 12/06/2018   PVC (premature ventricular contraction) 12/20/2016   History of squamous cell carcinoma of skin 05/26/2015   Family history of cardiac disorder 03/17/2015   Obesity (BMI 30-39.9) 03/17/2015   Osteoarthrosis 03/17/2015   Perennial allergic rhinitis with seasonal variation 03/17/2015   Hyperlipidemia    Hypertension, benign    Metabolic syndrome 04/12/2012    Past Surgical History:  Procedure Laterality Date   APPENDECTOMY  April 2020   COLONOSCOPY WITH PROPOFOL  N/A 08/08/2020   Procedure: COLONOSCOPY WITH PROPOFOL ;  Surgeon: Marnee Sink, MD;  Location: [facility];  Service: Endoscopy;  Laterality: N/A;  priority 4   LAPAROSCOPIC APPENDECTOMY N/A 11/28/2018   Procedure: APPENDECTOMY LAPAROSCOPIC;  Surgeon: Alben Alma, MD;  Location: [facility] OR[facility];  Service: General;  Laterality: N/A;   Open Lumbar Five-Sacral One replacement of loose screws, Lumbar Five-Pelvis posterolateral instrumented fusion  09/22/2021   Dr. Ali Antonio   ORTHOPEDIC SURGERY     POLYPECTOMY  08/08/2020   Procedure: POLYPECTOMY;  Surgeon: Marnee Sink, MD;  Location: [facility];  Service: Endoscopy;;   SKIN CANCER DESTRUCTION Left 03/06/2015   Hand- Dr. Tyrone Gallop   SKIN CANCER EXCISION Left 08/2018   Dr. Tyrone Gallop on Left Arm    SPINE SURGERY  February 2023   TRANSFORAMINAL LUMBAR INTERBODY FUSION W/ MIS 1 LEVEL Left 10/10/2020   Procedure: Right  Lumbar Five Sacral One Minimally invasive transforaminal lumbar interbody fusion;  Surgeon: Cannon Champion, MD;  Location: [facility] OR;  Service: Neurosurgery;  Laterality: Left;  posterior    Family History  Problem Relation Age of Onset   Heart disease Mother    Heart attack Mother    COPD Mother    Heart disease Father    Hypertension Father    COPD Father    Cancer Father         Prostate and Skin   COPD Sister    Suicidality Brother    CAD Brother    Depression Daughter     Social History   Tobacco Use   Smoking status: Former    Current packs/day: 0.00    Average packs/day: 1 pack/day for 15.0 years (15.0 ttl pk-yrs)    Types: Cigarettes    Start date: 08/24/1987    Quit date: 08/26/2002    Years since quitting: 21.3   Smokeless tobacco: Never  Substance Use Topics   Alcohol use: Yes    Comment: Couple of beer's a week socially     Current Outpatient Medications:    acetaminophen  (TYLENOL ) 500 MG tablet, Take 1,000 mg by mouth every 6 (six) hours as needed., Disp: , Rfl:    Ascorbic Acid (VITAMIN C PO), Take 1 tablet by mouth every other day., Disp: , Rfl:    aspirin  81 MG tablet, Take 1 tablet (81 mg total) by mouth daily. Okay to restart on 09/30/21, Disp: 30 tablet, Rfl:    atorvastatin  (LIPITOR) 40 MG tablet, Take 1 tablet (40 mg total) by mouth daily., Disp: 90 tablet, Rfl: 3   carvedilol  (COREG ) 3.125 MG tablet, Take 1 tablet (3.125 mg total) by mouth 2 (two) times daily., Disp: 180 tablet, Rfl: 3   Cholecalciferol (VITAMIN D) 50 MCG (2000 UT) tablet, Take 2,000 Units by mouth daily., Disp: , Rfl:    clobetasol  cream (TEMOVATE ) 0.05 %, Apply small Application topically 2 (two) times daily for 1 to 2 weeks as needed, Disp: 45 g, Rfl: 0   COVID-19 mRNA vaccine 2023-2024 (COMIRNATY ) SUSP injection, Inject into the muscle., Disp: 0.3 mL, Rfl: 0   levocetirizine (XYZAL ) 5 MG tablet, Take 1 tablet (5 mg total) by mouth every evening., Disp: 30 tablet, Rfl: 1   losartan   (COZAAR ) 50 MG tablet, Take 1 tablet (50 mg total) by mouth daily., Disp: 90 tablet, Rfl: 3   MAGNESIUM PO, Take 1 tablet by mouth every other day., Disp: , Rfl:    Multiple Vitamin (MULTIVITAMIN) tablet, Take 1 tablet by mouth daily., Disp: , Rfl:    naproxen  (NAPROSYN ) 500 MG tablet, TAKE 1 TABLET BY MOUTH TWICE DAILY, Disp: 60 tablet, Rfl: 5   BERBERINE CHLORIDE PO, Take 2 mg by mouth daily in the afternoon., Disp: , Rfl:    calcium  carbonate (OS-CAL - DOSED IN MG OF ELEMENTAL CALCIUM ) 1250 (500 Ca) MG tablet, Take 1 tablet by mouth daily., Disp: , Rfl:    clobetasol  cream (TEMOVATE ) 0.05 %, Apply 1 Application topically 2 (two) times daily for 1 to 2 weeks., Disp: 15 g, Rfl: 0   clobetasol  cream (TEMOVATE ) 0.05 %, Apply 1 small  Application topically 2 (two) times daily for 1 to 2 weeks as needed (Patient not taking: Reported on 12/22/2023), Disp: 45 g, Rfl: 1   tacrolimus  (PROTOPIC ) 0.1 % ointment, Apply 1 Application (1 gram) topically 2 (two) times daily as needed., Disp: 30 g, Rfl: 1  Allergies  Allergen Reactions   Lisinopril Rash    pins and needles in arms    I personally reviewed active problem list, medication list, allergies, family history with the patient/caregiver today.   ROS  Ten systems reviewed and is negative except as mentioned in HPI    Objective Physical Exam Constitutional: Patient appears well-developed and well-nourished. Obese  No distress.  HEENT: head atraumatic, normocephalic, pupils equal and reactive to light, neck supple Cardiovascular: Normal rate, regular rhythm and normal heart sounds.  No murmur heard. No BLE edema. Pulmonary/Chest: Effort normal and breath sounds normal. No respiratory distress. Abdominal: Soft.  There is no tenderness. Psychiatric: Patient has a normal mood and affect. behavior is normal. Judgment and thought content normal.   Vitals:   12/22/23 1019  BP: 136/80  Pulse: 65  Resp: 16  SpO2: 97%  Weight: 215 lb 14.4 oz (97.9  kg)  Height: 5\' 8"  (1.727 m)    Body mass index is 32.83 kg/m.    PHQ2/9:    11/10/2023   10:18 AM 02/03/2023    1:43 PM 07/29/2022   10:59 AM 04/14/2022    7:44 AM 01/12/2022    2:10 PM  Depression screen PHQ 2/9  Decreased Interest 0 0 0 0 0  Down, Depressed, Hopeless 0 0 0 0 0  PHQ - 2 Score 0 0 0 0 0  Altered sleeping 0  0 0 0  Tired, decreased energy 0  0 0 0  Change in appetite 0  0 0 0  Feeling bad or failure about yourself  0  0 0 0  Trouble concentrating 0  0 0 0  Moving slowly or fidgety/restless 0  0 0 0  Suicidal thoughts 0  0 0 0  PHQ-9 Score 0  0 0 0  Difficult doing work/chores Not difficult at all  Not difficult at all      phq 9 is negative  Fall Risk:    11/10/2023   10:17 AM 02/03/2023    1:39 PM 07/29/2022   10:59 AM 04/14/2022    7:44 AM 01/12/2022    2:10 PM  Fall Risk   Falls in the past year? 0 0 0 0 0  Number falls in past yr: 0 0 0 0 0  Injury with Fall? 0 0 0 0 0  Risk for fall due to : No Fall Risks  No Fall Risks No Fall Risks No Fall Risks  Follow up Falls prevention discussed;Education provided;Falls evaluation completed Education provided;Falls prevention discussed Falls prevention discussed;Education provided;Falls evaluation completed Falls prevention discussed Falls prevention discussed     Assessment & Plan Hypertension Blood pressure slightly above target but normalized before he left -continue Losartan  50 mg daily   Coronary artery disease No current angina. Goal to maintain LDL below 70 mg/dL and control blood pressure. - Continue current medications. - Monitor blood pressure and cholesterol.  Atherosclerosis of aorta Managed with atorvastatin  and losartan . Recent imaging showed no aneurysm. - Continue atorvastatin  40 mg daily.- dose recently increased by Dr. Alvenia Aus due to LDL still not at goal on labs done January 2025 - Continue losartan  50 mg daily, increase to 100 mg if needed.  Adrenal Incidentaloma  left -two separate  CT showed stability   Hyperlipidemia LDL was 92 mg/dL. Atorvastatin  increased to achieve LDL target below 70 mg/dL. - Continue atorvastatin  40 mg daily. - Recheck lipid panel in 6 months.  Supraventricular tachycardia Managed with carvedilol . No current symptoms. - Continue carvedilol  as prescribed.  Lumbar radiculopathy Chronic with recent exacerbation. MRI showed disc narrowing and arthritis. Discussed Medrol  for inflammation. - Prescribe Medrol  dose pack. - Advise taking with food and not before bed.  Erectile dysfunction Difficulty with erection. Discussed Cialis  dosing, cost, and insurance coverage. - Prescribe Cialis  5 mg daily. - Adjust dose to 10 mg every three days or 20 mg if needed. - Use GoodRx for cost savings.

## 2023-12-23 ENCOUNTER — Encounter: Payer: Self-pay | Admitting: Family Medicine

## 2023-12-23 DIAGNOSIS — N528 Other male erectile dysfunction: Secondary | ICD-10-CM

## 2023-12-23 MED ORDER — TADALAFIL 5 MG PO TABS: 5.0000 mg | ORAL_TABLET | Freq: Every day | ORAL | 0 refills | Status: DC

## 2023-12-23 NOTE — Addendum Note (Signed)
 Addended by: Tsosie Gail on: 12/23/2023 03:08 PM   Modules accepted: Orders

## 2024-01-09 ENCOUNTER — Other Ambulatory Visit (HOSPITAL_COMMUNITY): Payer: Self-pay

## 2024-01-09 ENCOUNTER — Telehealth: Payer: Self-pay | Admitting: Family Medicine

## 2024-01-09 ENCOUNTER — Ambulatory Visit (INDEPENDENT_AMBULATORY_CARE_PROVIDER_SITE_OTHER): Admitting: Family Medicine

## 2024-01-09 ENCOUNTER — Encounter: Payer: Self-pay | Admitting: Family Medicine

## 2024-01-09 ENCOUNTER — Other Ambulatory Visit: Payer: Self-pay

## 2024-01-09 VITALS — BP 132/70 | HR 65 | Resp 16 | Ht 68.0 in | Wt 212.5 lb

## 2024-01-09 DIAGNOSIS — R1013 Epigastric pain: Secondary | ICD-10-CM

## 2024-01-09 DIAGNOSIS — R142 Eructation: Secondary | ICD-10-CM | POA: Diagnosis not present

## 2024-01-09 DIAGNOSIS — F341 Dysthymic disorder: Secondary | ICD-10-CM | POA: Diagnosis not present

## 2024-01-09 DIAGNOSIS — R1012 Left upper quadrant pain: Secondary | ICD-10-CM

## 2024-01-09 DIAGNOSIS — N528 Other male erectile dysfunction: Secondary | ICD-10-CM

## 2024-01-09 MED ORDER — SERTRALINE HCL 50 MG PO TABS
50.0000 mg | ORAL_TABLET | Freq: Every day | ORAL | 1 refills | Status: DC
  Filled 2024-01-09: qty 30, 30d supply, fill #0

## 2024-01-09 MED ORDER — TADALAFIL 5 MG PO TABS
5.0000 mg | ORAL_TABLET | Freq: Every day | ORAL | 1 refills | Status: DC
Start: 2024-01-09 — End: 2024-01-09

## 2024-01-09 MED ORDER — OMEPRAZOLE 20 MG PO CPDR
20.0000 mg | DELAYED_RELEASE_CAPSULE | ORAL | 1 refills | Status: DC
Start: 2024-01-09 — End: 2024-01-09
  Filled 2024-01-09: qty 30, 30d supply, fill #0

## 2024-01-09 MED ORDER — OMEPRAZOLE 20 MG PO CPDR: 20.0000 mg | DELAYED_RELEASE_CAPSULE | ORAL | 1 refills | Status: DC

## 2024-01-09 MED ORDER — TADALAFIL 5 MG PO TABS: 5.0000 mg | ORAL_TABLET | Freq: Every day | ORAL | 1 refills | Status: DC

## 2024-01-09 MED ORDER — SERTRALINE HCL 50 MG PO TABS: 50.0000 mg | ORAL_TABLET | Freq: Every day | ORAL | 1 refills | Status: DC

## 2024-01-09 NOTE — Telephone Encounter (Signed)
 Pt notified they have been re sent to [facility].

## 2024-01-09 NOTE — Progress Notes (Signed)
 Name: Richard Mueller   MRN: [id number]    DOB: 1956/01/12   Date:01/09/2024       Progress Note  Subjective  Chief Complaint  Chief Complaint  Patient presents with   Abdominal Pain    L upper quadrant on/off tightness, not shooting pain.   Anxiety    Going on for months   Discussed the use of AI scribe software for clinical note transcription with the patient, who gave verbal consent to proceed.  History of Present Illness Richard Mueller "[name]" is a 68 year old male who presents with left upper quadrant abdominal pain and anxiety.  He has been experiencing left upper quadrant abdominal pain for the past month to month and a half. The pain is described as discomfort or tightness, similar to a 'squeeze' or 'cramp', and is intermittent. It occurs more frequently when he sits or lies down and is not related to food intake. No nausea, diarrhea, or changes in bowel movements, but there is increased burping and occasional indigestion for which he takes Tums.  He is experiencing significant anxiety related to his personal life circumstances, including his recent retirement in January 2023, subsequent back surgery, and the ongoing care of his daughter, Richard Mueller, who is awaiting a liver transplant. He feels overwhelmed by the responsibilities and changes in his life, which have affected his mood and social interactions. He notes a shift from being social to preferring solitude, a lack of motivation for activities he once enjoyed, such as cooking, and increased irritability. These stressors have impacted his marriage and personal well-being.  His daughter, Richard Mueller, has ADHD and is inconsistent with her medication, which adds to the stress. His wife, [name], is the primary caregiver for Richard Mueller, and this responsibility has affected their ability to enjoy retirement as planned.  He is currently taking Cialis  once a day, which he reports works well for him. He also takes naproxen  as needed  for pain, ensuring to take it with food to minimize gastrointestinal discomfort.    Patient Active Problem List   Diagnosis Date Noted   Coronary artery disease due to lipid rich plaque 11/10/2023   Pseudoarthrosis of lumbar spine 09/22/2021   Spondylolisthesis of lumbar region 10/10/2020   History of colonic polyps    Polyp of sigmoid colon    Elevated coronary artery calcium  score 06/18/2020   SVT (supraventricular tachycardia) (HCC) 06/18/2020   Adrenal incidentaloma (HCC) 12/18/2019   Atherosclerosis of aorta (HCC) 12/06/2018   Degenerative disc disease, lumbar 12/06/2018   Adenoma of left adrenal gland 12/06/2018   PVC (premature ventricular contraction) 12/20/2016   History of squamous cell carcinoma of skin 05/26/2015   Family history of cardiac disorder 03/17/2015   Obesity (BMI 30-39.9) 03/17/2015   Osteoarthrosis 03/17/2015   Perennial allergic rhinitis with seasonal variation 03/17/2015   Hyperlipidemia    Hypertension, benign    Metabolic syndrome 04/12/2012    Past Surgical History:  Procedure Laterality Date   APPENDECTOMY  April 2020   COLONOSCOPY WITH PROPOFOL  N/A 08/08/2020   Procedure: COLONOSCOPY WITH PROPOFOL ;  Surgeon: Marnee Sink, MD;  Location: [facility];  Service: Endoscopy;  Laterality: N/A;  priority 4   LAPAROSCOPIC APPENDECTOMY N/A 11/28/2018   Procedure: APPENDECTOMY LAPAROSCOPIC;  Surgeon: Alben Alma, MD;  Location: [facility] ORS;  Service: General;  Laterality: N/A;   Open Lumbar Five-Sacral One replacement of loose screws, Lumbar Five-Pelvis posterolateral instrumented fusion  09/22/2021   Dr. Ali Antonio   ORTHOPEDIC SURGERY  POLYPECTOMY  08/08/2020   Procedure: POLYPECTOMY;  Surgeon: Marnee Sink, MD;  Location: [facility];  Service: Endoscopy;;   SKIN CANCER DESTRUCTION Left 03/06/2015   Hand- Dr. Tyrone Gallop   SKIN CANCER EXCISION Left 08/2018   Dr. Tyrone Gallop on Left Arm    SPINE SURGERY  February 2023   TRANSFORAMINAL LUMBAR  INTERBODY FUSION W/ MIS 1 LEVEL Left 10/10/2020   Procedure: Right Lumbar Five Sacral One Minimally invasive transforaminal lumbar interbody fusion;  Surgeon: Cannon Champion, MD;  Location: [facility] OR;  Service: Neurosurgery;  Laterality: Left;  posterior    Family History  Problem Relation Age of Onset   Heart disease Mother    Heart attack Mother    COPD Mother    Heart disease Father    Hypertension Father    COPD Father    Cancer Father         Prostate and Skin   COPD Sister    Suicidality Brother    CAD Brother    Depression Daughter    Cirrhosis Daughter     Social History   Tobacco Use   Smoking status: Former    Current packs/day: 0.00    Average packs/day: 1 pack/day for 15.0 years (15.0 ttl pk-yrs)    Types: Cigarettes    Start date: 08/24/1987    Quit date: 08/26/2002    Years since quitting: 21.3   Smokeless tobacco: Never  Substance Use Topics   Alcohol use: Yes    Comment: Couple of beer's a week socially     Current Outpatient Medications:    acetaminophen  (TYLENOL ) 500 MG tablet, Take 1,000 mg by mouth every 6 (six) hours as needed., Disp: , Rfl:    Ascorbic Acid (VITAMIN C PO), Take 1 tablet by mouth every other day., Disp: , Rfl:    aspirin  81 MG tablet, Take 1 tablet (81 mg total) by mouth daily. Okay to restart on 09/30/21, Disp: 30 tablet, Rfl:    atorvastatin  (LIPITOR) 40 MG tablet, Take 1 tablet (40 mg total) by mouth daily., Disp: 90 tablet, Rfl: 3   carvedilol  (COREG ) 3.125 MG tablet, Take 1 tablet (3.125 mg total) by mouth 2 (two) times daily., Disp: 180 tablet, Rfl: 3   Cholecalciferol (VITAMIN D) 50 MCG (2000 UT) tablet, Take 2,000 Units by mouth daily., Disp: , Rfl:    clobetasol  cream (TEMOVATE ) 0.05 %, Apply small Application topically 2 (two) times daily for 1 to 2 weeks as needed, Disp: 45 g, Rfl: 0   levocetirizine (XYZAL ) 5 MG tablet, Take 1 tablet (5 mg total) by mouth every evening., Disp: 30 tablet, Rfl: 1   losartan  (COZAAR ) 50 MG  tablet, Take 1 tablet (50 mg total) by mouth daily., Disp: 90 tablet, Rfl: 3   MAGNESIUM PO, Take 1 tablet by mouth every other day., Disp: , Rfl:    methylPREDNISolone  (MEDROL  DOSEPAK) 4 MG TBPK tablet, Take as directed, Disp: 21 tablet, Rfl: 0   Multiple Vitamin (MULTIVITAMIN) tablet, Take 1 tablet by mouth daily., Disp: , Rfl:    naproxen  (NAPROSYN ) 500 MG tablet, Take 500 mg by mouth 2 (two) times daily as needed., Disp: , Rfl:    tadalafil  (CIALIS ) 5 MG tablet, Take 1 tablet (5 mg total) by mouth daily., Disp: 30 tablet, Rfl: 0   tacrolimus  (PROTOPIC ) 0.1 % ointment, Apply 1 Application (1 gram) topically 2 (two) times daily as needed., Disp: 30 g, Rfl: 1  Allergies  Allergen Reactions   Lisinopril Rash  pins and needles in arms    I personally reviewed active problem list, medication list, allergies, family history with the patient/caregiver today.   ROS  Ten systems reviewed and is negative except as mentioned in HPI    Objective Physical Exam CONSTITUTIONAL: Patient appears well-developed and well-nourished. No distress. HEENT: Head atraumatic, normocephalic, neck supple. CARDIOVASCULAR: Normal rate, regular rhythm and normal heart sounds. No murmur heard. No BLE edema. PULMONARY: Effort normal and breath sounds normal. No respiratory distress. ABDOMINAL: Abdomen normal. There is no tenderness or distention. MUSCULOSKELETAL: Normal gait. Without gross motor or sensory deficit. PSYCHIATRIC: Patient has a normal mood and affect. Behavior is normal. Judgment and thought content normal.  Vitals:   01/09/24 0902  BP: 132/70  Pulse: 65  Resp: 16  SpO2: 97%  Weight: 212 lb 8 oz (96.4 kg)  Height: 5\' 8"  (1.727 m)    Body mass index is 32.31 kg/m.    PHQ2/9:    01/09/2024    9:00 AM 11/10/2023   10:18 AM 02/03/2023    1:43 PM 07/29/2022   10:59 AM 04/14/2022    7:44 AM  Depression screen PHQ 2/9  Decreased Interest 2 0 0 0 0  Down, Depressed, Hopeless 2 0 0 0 0   PHQ - 2 Score 4 0 0 0 0  Altered sleeping 2 0  0 0  Tired, decreased energy 2 0  0 0  Change in appetite 0 0  0 0  Feeling bad or failure about yourself  1 0  0 0  Trouble concentrating 0 0  0 0  Moving slowly or fidgety/restless 0 0  0 0  Suicidal thoughts 0 0  0 0  PHQ-9 Score 9 0  0 0  Difficult doing work/chores Somewhat difficult Not difficult at all  Not difficult at all     phq 9 is positive  Fall Risk:    01/09/2024    9:00 AM 11/10/2023   10:17 AM 02/03/2023    1:39 PM 07/29/2022   10:59 AM 04/14/2022    7:44 AM  Fall Risk   Falls in the past year? 0 0 0 0 0  Number falls in past yr: 0 0 0 0 0  Injury with Fall? 0 0 0 0 0  Risk for fall due to : No Fall Risks No Fall Risks  No Fall Risks No Fall Risks  Follow up Falls prevention discussed;Education provided;Falls evaluation completed Falls prevention discussed;Education provided;Falls evaluation completed Education provided;Falls prevention discussed Falls prevention discussed;Education provided;Falls evaluation completed Falls prevention discussed      Assessment & Plan Left upper quadrant abdominal pain Intermittent pain not related to food, possible gastritis, pancreatic issues, or radiculitis. Increased belching and indigestion noted. Discussed differential diagnosis and plan to rule out pancreatic issues and H. pylori infection. Considered stress-related gastritis and radiculitis. - Order lipase and H. pylori tests. - Consider trial of omeprazole  post-testing for gastritis. - If symptoms persist, consider CT scan. - Consult spine specialist regarding radiculitis.  Anxiety Chronic anxiety worsened by life stressors. Symptoms include irritability, lack of motivation, and social withdrawal. Discussed medication and therapy options. Recommended duloxetine for motivation and pain management. Explained potential side effects. - Prescribe duloxetine 30 mg daily, increase to 60 mg as needed. - Schedule follow-up in six  weeks to assess response. - Encourage self-care activities and daily personal time. - Discuss potential benefits of therapy, including Christian therapy options.

## 2024-01-09 NOTE — Telephone Encounter (Signed)
 Please send all his prescriptions to [facility] in [place]. [facility] will not pay for his prescriptions

## 2024-01-11 ENCOUNTER — Ambulatory Visit: Admitting: Family Medicine

## 2024-01-11 ENCOUNTER — Ambulatory Visit: Payer: Self-pay | Admitting: Family Medicine

## 2024-01-11 LAB — H. PYLORI BREATH TEST: H. pylori Breath Test: NOT DETECTED

## 2024-01-11 LAB — LIPASE: Lipase: 27 U/L (ref 7–60)

## 2024-01-12 ENCOUNTER — Other Ambulatory Visit: Payer: Self-pay | Admitting: Neurological Surgery

## 2024-01-12 ENCOUNTER — Encounter: Payer: Self-pay | Admitting: Cardiovascular Disease

## 2024-01-12 ENCOUNTER — Ambulatory Visit: Payer: Medicare Other | Attending: Cardiovascular Disease | Admitting: Cardiovascular Disease

## 2024-01-12 VITALS — BP 122/67 | HR 50 | Ht 68.0 in | Wt 215.8 lb

## 2024-01-12 DIAGNOSIS — M960 Pseudarthrosis after fusion or arthrodesis: Secondary | ICD-10-CM

## 2024-01-12 DIAGNOSIS — E785 Hyperlipidemia, unspecified: Secondary | ICD-10-CM | POA: Diagnosis present

## 2024-01-12 DIAGNOSIS — I2510 Atherosclerotic heart disease of native coronary artery without angina pectoris: Secondary | ICD-10-CM | POA: Diagnosis present

## 2024-01-12 DIAGNOSIS — I10 Essential (primary) hypertension: Secondary | ICD-10-CM | POA: Diagnosis present

## 2024-01-12 MED ORDER — EZETIMIBE 10 MG PO TABS: 10.0000 mg | ORAL_TABLET | Freq: Every day | ORAL | 3 refills | Status: AC

## 2024-01-12 NOTE — Progress Notes (Signed)
 Cardiology Office Note   Date:  01/12/2024   ID:  Richard Mueller, DOB [birth year], MRN [id number]  PCP:  Arleen Lacer, MD  Cardiologist:   Antionette Kirks, MD   No chief complaint on file.     History of Present Illness: Richard Mueller is a 68 y.o. male who presents for a follow-up regarding moderate nonobstructive coronary artery disease and palpitations.  He does have a family history of CAD.  He is known to have aortic atherosclerosis on previous CT imaging. He has history of palpitations.  ZIO monitor in 2021 showed 1 episode of wide-complex tachycardia lasting only 4 beats and was likely due to SVT with aberrancy although slow nonsustained ventricular tachycardia could not be excluded.  He was also noted to have short runs of SVT the longest lasted 16 beats.  Echocardiogram in August 2021 showed normal LV systolic function with mild mitral regurgitation and no significant pulmonary hypertension.  Cardiac CTA in September 2021 showed a calcium  score of 857 with evidence of moderate stenosis in the proximal LAD as well as mild left circumflex disease.  None of the lesions were significant by FFR.  He had lumbar spine surgery twice and continues to struggle with low back pain.    He has been doing well from a cardiac standpoint with no chest pain, shortness of breath or syncope.  He has minimal palpitations overall.  He has been under stress due to illness of his daughter who has liver failure.  He also has symptoms of erectile dysfunction that responded well to Cialis .   Past Medical History:  Diagnosis Date   Allergic rhinitis, cause unspecified    Allergy    Bronchitis    Cancer (HCC) 2020   skin cancer   Hyperlipidemia    Hypertension    Metabolic syndrome    Obesity, unspecified    Osteoarthritis    Osteoarthrosis, unspecified whether generalized or localized, unspecified site    Pneumonia    Right upper quadrant pain     Past Surgical  History:  Procedure Laterality Date   APPENDECTOMY  April 2020   COLONOSCOPY WITH PROPOFOL  N/A 08/08/2020   Procedure: COLONOSCOPY WITH PROPOFOL ;  Surgeon: Marnee Sink, MD;  Location: [facility];  Service: Endoscopy;  Laterality: N/A;  priority 4   LAPAROSCOPIC APPENDECTOMY N/A 11/28/2018   Procedure: APPENDECTOMY LAPAROSCOPIC;  Surgeon: Alben Alma, MD;  Location: [facility] OR[facility];  Service: General;  Laterality: N/A;   Open Lumbar Five-Sacral One replacement of loose screws, Lumbar Five-Pelvis posterolateral instrumented fusion  09/22/2021   Dr. Ali Antonio   ORTHOPEDIC SURGERY     POLYPECTOMY  08/08/2020   Procedure: POLYPECTOMY;  Surgeon: Marnee Sink, MD;  Location: [facility];  Service: Endoscopy;;   SKIN CANCER DESTRUCTION Left 03/06/2015   Hand- Dr. Tyrone Gallop   SKIN CANCER EXCISION Left 08/2018   Dr. Tyrone Gallop on Left Arm    SPINE SURGERY  February 2023   TRANSFORAMINAL LUMBAR INTERBODY FUSION W/ MIS 1 LEVEL Left 10/10/2020   Procedure: Right Lumbar Five Sacral One Minimally invasive transforaminal lumbar interbody fusion;  Surgeon: Cannon Champion, MD;  Location: [facility] OR;  Service: Neurosurgery;  Laterality: Left;  posterior     Current Outpatient Medications  Medication Sig Dispense Refill   acetaminophen  (TYLENOL ) 500 MG tablet Take 1,000 mg by mouth every 6 (six) hours as needed.     Ascorbic Acid (VITAMIN C PO) Take 1 tablet by mouth every other day.  aspirin  81 MG tablet Take 1 tablet (81 mg total) by mouth daily. Okay to restart on 09/30/21 30 tablet    atorvastatin  (LIPITOR) 40 MG tablet Take 1 tablet (40 mg total) by mouth daily. 90 tablet 3   carvedilol  (COREG ) 3.125 MG tablet Take 1 tablet (3.125 mg total) by mouth 2 (two) times daily. 180 tablet 3   Cholecalciferol (VITAMIN D) 50 MCG (2000 UT) tablet Take 2,000 Units by mouth daily.     clobetasol  cream (TEMOVATE ) 0.05 % Apply small Application topically 2 (two) times daily for 1 to 2 weeks as needed 45 g 0    losartan  (COZAAR ) 50 MG tablet Take 1 tablet (50 mg total) by mouth daily. 90 tablet 3   MAGNESIUM PO Take 1 tablet by mouth every other day.     Multiple Vitamin (MULTIVITAMIN) tablet Take 1 tablet by mouth daily.     naproxen  (NAPROSYN ) 500 MG tablet Take 500 mg by mouth 2 (two) times daily as needed.     omeprazole  (PRILOSEC) 20 MG capsule Take 1 capsule (20 mg total) by mouth every morning. 30 capsule 1   sertraline  (ZOLOFT ) 50 MG tablet Take 1 tablet (50 mg total) by mouth daily. 30 tablet 1   tadalafil  (CIALIS ) 5 MG tablet Take 1 tablet (5 mg total) by mouth daily. 90 tablet 1   levocetirizine (XYZAL ) 5 MG tablet Take 1 tablet (5 mg total) by mouth every evening. (Patient not taking: Reported on 01/12/2024) 30 tablet 1   No current facility-administered medications for this visit.    Allergies:   Lisinopril    Social History:  The patient  reports that he quit smoking about 21 years ago. His smoking use included cigarettes. He started smoking about 36 years ago. He has a 15 pack-year smoking history. He has never used smokeless tobacco. He reports current alcohol use. He reports that he does not use drugs.   Family History:  The patient's family history includes CAD in his brother; COPD in his father, mother, and sister; Cancer in his father; Cirrhosis in his daughter; Depression in his daughter; Heart attack in his mother; Heart disease in his father and mother; Hypertension in his father; Suicidality in his brother.    ROS:  Please see the history of present illness.   Otherwise, review of systems are positive for none.   All other systems are reviewed and negative.    PHYSICAL EXAM: VS:  BP 122/67 (BP Location: Left Arm, Patient Position: Sitting)   Pulse (!) 50   Ht 5\' 8"  (1.727 m)   Wt 215 lb 12.8 oz (97.9 kg)   SpO2 97%   BMI 32.81 kg/m  , BMI Body mass index is 32.81 kg/m. GEN: Well nourished, well developed, in no acute distress  HEENT: normal  Neck: no JVD, carotid  bruits, or masses Cardiac: RRR; no murmurs, rubs, or gallops,no edema  Respiratory:  clear to auscultation bilaterally, normal work of breathing GI: soft, nontender, nondistended, + BS MS: no deformity or atrophy  Skin: warm and dry, no rash Neuro:  Strength and sensation are intact Psych: euthymic mood, full affect   EKG:  EKG is ordered today. The ekg ordered today demonstrates : Sinus bradycardia Right bundle branch block Left anterior fascicular block Bifascicular block Minimal voltage criteria for LVH, may be normal variant ( R in aVL )    Recent Labs: 09/06/2023: ALT 21; BUN 20; Creatinine, Ser 0.88; Hemoglobin 13.0; Platelets 189; Potassium 4.4; Sodium 138  Lipid Panel    Component Value Date/Time   CHOL 155 09/06/2023 0755   CHOL 128 11/20/2021 1001   TRIG 65 09/06/2023 0755   TRIG 108 02/27/2013 1609   HDL 50 09/06/2023 0755   HDL 50 11/20/2021 1001   HDL 42 02/27/2013 1609   CHOLHDL 3.1 09/06/2023 0755   VLDL 13 09/06/2023 0755   LDLCALC 92 09/06/2023 0755   LDLCALC 59 11/20/2021 1001   LDLCALC 60 06/18/2020 0834   LDLCALC 76 02/27/2013 1609      Wt Readings from Last 3 Encounters:  01/12/24 215 lb 12.8 oz (97.9 kg)  01/09/24 212 lb 8 oz (96.4 kg)  12/22/23 215 lb 14.4 oz (97.9 kg)        ASSESSMENT AND PLAN:   1.  Coronary artery disease involving native coronary arteries without angina: Known moderate nonobstructive coronary artery disease on previous CTA.  Currently with no anginal symptoms.  Continue treatment of risk factors and low-dose aspirin  81 mg daily.  2.  Palpitations: Short runs of SVT noted on previous monitor.  Symptoms are well controlled on small dose carvedilol .  3. Hyperlipidemia: Continue atorvastatin .  Most recent lipid profile showed an LDL of 92 which is not optimal.  I discussed options with him and recommend adding ezetimibe 10 mg daily.  Recheck lipid and liver profile in 2 months.   4. Essential hypertension: Blood  pressure is well-controlled on current medications.  5.  Erectile dysfunction: Likely reflects a degree of atherosclerosis.  Good response to Cialis .  6.  Hypocalcemia: We discussed the correlation between calcium  supplements and heart disease but given that his calcium  level is trending down, he can resume calcium  but with vitamin D.     Disposition:   FU with me in 1 year  Signed,  Antionette Kirks, MD  01/12/2024 8:11 AM    [facility]

## 2024-01-12 NOTE — Patient Instructions (Signed)
 Medication Instructions:  START Ezetimibe (Zetia) 10 mg once daily  *If you need a refill on your cardiac medications before your next appointment, please call your pharmacy*  Lab Work: Your provider would like for you to return in 2 months to have the following labs drawn: lipid and liver.   Please go to [facility] [address] ([facility] [address], [place] [address] You do not need an appointment.  They are open from 8 am- 4:30 pm.  Lunch from 1:00 pm- 2:00 pm You will need to be fasting.   You may also go to one of the following [facility]:  [address] [place], [place] [address] Phone: [phone] Lab hours: Mon-Fri 8 am- 5 pm    Lunch 12 pm- 1 pm  [address] [place],  [place]  [address]  [place]  Phone: [phone] Lab hours: 7 am- 4 pm Lunch 12 pm-1 pm   [address] [place],  [place]  [address]  [place]  Phone: [phone] Lab hours: Mon-Fri 8 am- 5 pm    Lunch 12 pm- 1 pm  If you have labs (blood work) drawn today and your tests are completely normal, you will receive your results only by: MyChart Message (if you have MyChart) OR A paper copy in the mail If you have any lab test that is abnormal or we need to change your treatment, we will call you to review the results.  Testing/Procedures: None ordered  Follow-Up: At [facility], you and your health needs are our priority.  As part of our continuing mission to provide you with exceptional heart care, our providers are all part of one team.  This team includes your primary Cardiologist (physician) and Advanced Practice Providers or APPs (Physician Assistants and Nurse Practitioners) who all work together to provide you with the care you need, when you need it.  Your next appointment:   12 month(s)  Provider:   You may see Dr. Alvenia Aus or one of the following Advanced Practice Providers on your designated Care Team:   Laneta Pintos, NP Gildardo Labrador, PA-C Varney Gentleman, PA-C Cadence  Trout, PA-C Ronald Cockayne, NP Morey Ar, NP    We recommend signing up for the patient portal called "MyChart".  Sign up information is provided on this After Visit Summary.  MyChart is used to connect with patients for Virtual Visits (Telemedicine).  Patients are able to view lab/test results, encounter notes, upcoming appointments, etc.  Non-urgent messages can be sent to your provider as well.   To learn more about what you can do with MyChart, go to [email or web address].

## 2024-01-24 ENCOUNTER — Ambulatory Visit
Admission: RE | Admit: 2024-01-24 | Discharge: 2024-01-24 | Disposition: A | Discharge status: Home / Self Care | Source: Ambulatory Visit | Attending: Neurological Surgery | Admitting: Neurological Surgery

## 2024-01-24 DIAGNOSIS — M960 Pseudarthrosis after fusion or arthrodesis: Secondary | ICD-10-CM | POA: Diagnosis present

## 2024-02-07 ENCOUNTER — Encounter: Payer: Self-pay | Admitting: Family Medicine

## 2024-02-09 ENCOUNTER — Encounter

## 2024-03-05 ENCOUNTER — Other Ambulatory Visit: Payer: Self-pay

## 2024-03-05 DIAGNOSIS — E785 Hyperlipidemia, unspecified: Secondary | ICD-10-CM

## 2024-03-06 LAB — LIPID PANEL
Chol/HDL Ratio: 2.2 ratio (ref 0.0–5.0)
Cholesterol, Total: 98 mg/dL — ABNORMAL LOW (ref 100–199)
HDL: 44 mg/dL (ref >39)
LDL Chol Calc (NIH): 41 mg/dL (ref 0–99)
Triglycerides: 54 mg/dL (ref 0–149)
VLDL Cholesterol Cal: 13 mg/dL (ref 5–40)

## 2024-03-06 LAB — HEPATIC FUNCTION PANEL
ALT: 17 IU/L (ref 0–44)
AST: 21 IU/L (ref 0–40)
Albumin: 4 g/dL (ref 3.9–4.9)
Alkaline Phosphatase: 93 IU/L (ref 44–121)
Bilirubin Total: 0.4 mg/dL (ref 0.0–1.2)
Bilirubin, Direct: 0.15 mg/dL (ref 0.00–0.40)
Total Protein: 5.9 g/dL — ABNORMAL LOW (ref 6.0–8.5)

## 2024-03-07 ENCOUNTER — Ambulatory Visit: Payer: Self-pay | Admitting: Cardiovascular Disease

## 2024-03-07 ENCOUNTER — Ambulatory Visit (INDEPENDENT_AMBULATORY_CARE_PROVIDER_SITE_OTHER): Admitting: Family Medicine

## 2024-03-07 ENCOUNTER — Encounter: Payer: Self-pay | Admitting: Family Medicine

## 2024-03-07 VITALS — BP 114/66 | HR 70 | Resp 16 | Ht 68.0 in | Wt 212.7 lb

## 2024-03-07 DIAGNOSIS — F4323 Adjustment disorder with mixed anxiety and depressed mood: Secondary | ICD-10-CM | POA: Diagnosis not present

## 2024-03-07 DIAGNOSIS — N528 Other male erectile dysfunction: Secondary | ICD-10-CM

## 2024-03-07 MED ORDER — TADALAFIL 5 MG PO TABS: 5.0000 mg | ORAL_TABLET | Freq: Every day | ORAL | 1 refills | Status: AC

## 2024-03-07 NOTE — Progress Notes (Signed)
 Name: Richard Mueller   MRN: [id number]    DOB: 01/05/1956   Date:03/07/2024       Progress Note  Subjective  Chief Complaint  Chief Complaint  Patient presents with   Medical Management of Chronic Issues    GAD-Duloxetine stopped due to feeling tired   Discussed the use of AI scribe software for clinical note transcription with the patient, who gave verbal consent to proceed.  History of Present Illness Jakori Burkett Mueller [name] is a 68 year old male who presents for a six-week follow-up visit.  He has been feeling 'pretty good' since the last visit, although he has stopped taking sertraline  and is not currently on any medication. He previously discontinued loxapine due to fatigue. Despite not being on medication, he manages his stress through activities such as walking, motorcycle rides, and spending time with his wife on outings. He also uses a Catering manager as a form of relaxation.  He describes significant stress related to his family situation, particularly concerning his oldest daughter . This situation has led to canceled trips and increased responsibilities at home, contributing to his stress. His daughter, who has a history of mental illness and addiction, is living with him along with her two daughters. The family is dealing with the consequences of her health issues, including her need for a liver transplant due to liver failure.  His granddaughter, Ileana, is being evaluated as a potential live donor for her mother. Ileana is 29 years old, has graduated from college, and is working in [place]. He experiences stress and anticipatory grief related to his daughter's health and the impact on the family, including the potential for his granddaughter to become a liver donor.    Patient Active Problem List   Diagnosis Date Noted   Coronary artery disease due to lipid rich plaque 11/10/2023   Pseudoarthrosis of lumbar spine 09/22/2021   Spondylolisthesis of lumbar  region 10/10/2020   History of colonic polyps    Polyp of sigmoid colon    Elevated coronary artery calcium  score 06/18/2020   SVT (supraventricular tachycardia) (HCC) 06/18/2020   Adrenal incidentaloma (HCC) 12/18/2019   Atherosclerosis of aorta (HCC) 12/06/2018   Degenerative disc disease, lumbar 12/06/2018   PVC (premature ventricular contraction) 12/20/2016   History of squamous cell carcinoma of skin 05/26/2015   Family history of cardiac disorder 03/17/2015   Obesity (BMI 30-39.9) 03/17/2015   Osteoarthrosis 03/17/2015   Perennial allergic rhinitis with seasonal variation 03/17/2015   Hyperlipidemia    Hypertension, benign    Metabolic syndrome 04/12/2012    Past Surgical History:  Procedure Laterality Date   APPENDECTOMY  April 2020   COLONOSCOPY WITH PROPOFOL  N/A 08/08/2020   Procedure: COLONOSCOPY WITH PROPOFOL ;  Surgeon: Jinny Carmine, MD;  Location: [facility];  Service: Endoscopy;  Laterality: N/A;  priority 4   LAPAROSCOPIC APPENDECTOMY N/A 11/28/2018   Procedure: APPENDECTOMY LAPAROSCOPIC;  Surgeon: Jordis Laneta FALCON, MD;  Location: [facility];  Service: General;  Laterality: N/A;   Open Lumbar Five-Sacral One replacement of loose screws, Lumbar Five-Pelvis posterolateral instrumented fusion  09/22/2021   Dr. Cheryle   ORTHOPEDIC SURGERY     POLYPECTOMY  08/08/2020   Procedure: POLYPECTOMY;  Surgeon: Jinny Carmine, MD;  Location: [facility];  Service: Endoscopy;;   SKIN CANCER DESTRUCTION Left 03/06/2015   Hand- Dr. Arlyss   SKIN CANCER EXCISION Left 08/2018   Dr. Arlyss on Left Arm    SPINE SURGERY  February 2023  TRANSFORAMINAL LUMBAR INTERBODY FUSION W/ MIS 1 LEVEL Left 10/10/2020   Procedure: Right Lumbar Five Sacral One Minimally invasive transforaminal lumbar interbody fusion;  Surgeon: Cheryle Debby LABOR, MD;  Location: [facility] OR;  Service: Neurosurgery;  Laterality: Left;  posterior    Family History  Problem Relation Age of Onset   Heart  disease Mother    Heart attack Mother    COPD Mother    Heart disease Father    Hypertension Father    COPD Father    Cancer Father         Prostate and Skin   COPD Sister    Suicidality Brother    CAD Brother    Depression Daughter    Cirrhosis Daughter     Social History   Tobacco Use   Smoking status: Former    Current packs/day: 0.00    Average packs/day: 1 pack/day for 15.0 years (15.0 ttl pk-yrs)    Types: Cigarettes    Start date: 08/24/1987    Quit date: 08/26/2002    Years since quitting: 21.5   Smokeless tobacco: Never  Substance Use Topics   Alcohol use: Yes    Comment: Couple of beer's a week socially     Current Outpatient Medications:    acetaminophen  (TYLENOL ) 500 MG tablet, Take 1,000 mg by mouth every 6 (six) hours as needed., Disp: , Rfl:    Ascorbic Acid (VITAMIN C PO), Take 1 tablet by mouth every other day., Disp: , Rfl:    aspirin  81 MG tablet, Take 1 tablet (81 mg total) by mouth daily. Okay to restart on 09/30/21, Disp: 30 tablet, Rfl:    atorvastatin  (LIPITOR) 40 MG tablet, Take 1 tablet (40 mg total) by mouth daily., Disp: 90 tablet, Rfl: 3   carvedilol  (COREG ) 3.125 MG tablet, Take 1 tablet (3.125 mg total) by mouth 2 (two) times daily., Disp: 180 tablet, Rfl: 3   Cholecalciferol (VITAMIN D) 50 MCG (2000 UT) tablet, Take 2,000 Units by mouth daily., Disp: , Rfl:    clobetasol  cream (TEMOVATE ) 0.05 %, Apply small Application topically 2 (two) times daily for 1 to 2 weeks as needed, Disp: 45 g, Rfl: 0   ezetimibe  (ZETIA ) 10 MG tablet, Take 1 tablet (10 mg total) by mouth daily., Disp: 90 tablet, Rfl: 3   losartan  (COZAAR ) 50 MG tablet, Take 1 tablet (50 mg total) by mouth daily., Disp: 90 tablet, Rfl: 3   MAGNESIUM PO, Take 1 tablet by mouth every other day., Disp: , Rfl:    Multiple Vitamin (MULTIVITAMIN) tablet, Take 1 tablet by mouth daily., Disp: , Rfl:    naproxen  (NAPROSYN ) 500 MG tablet, Take 500 mg by mouth 2 (two) times daily as needed., Disp: ,  Rfl:    omeprazole  (PRILOSEC) 20 MG capsule, Take 1 capsule (20 mg total) by mouth every morning., Disp: 30 capsule, Rfl: 1   sertraline  (ZOLOFT ) 50 MG tablet, Take 1 tablet (50 mg total) by mouth daily., Disp: 30 tablet, Rfl: 1   tadalafil  (CIALIS ) 5 MG tablet, Take 1 tablet (5 mg total) by mouth daily., Disp: 90 tablet, Rfl: 1   levocetirizine (XYZAL ) 5 MG tablet, Take 1 tablet (5 mg total) by mouth every evening. (Patient not taking: Reported on 03/07/2024), Disp: 30 tablet, Rfl: 1  Allergies  Allergen Reactions   Lisinopril Rash    pins and needles in arms    I personally reviewed active problem list, medication list, allergies, family history with the patient/caregiver today.  ROS  Ten systems reviewed and is negative except as mentioned in HPI    Objective Physical Exam CONSTITUTIONAL: Patient appears well-developed and well-nourished. No distress. HEENT: Head atraumatic, normocephalic, neck supple. CARDIOVASCULAR: Normal rate, regular rhythm and normal heart sounds. No murmur heard. No BLE edema. PULMONARY: Effort normal and breath sounds normal. Lungs clear to auscultation. No respiratory distress. ABDOMINAL: There is no tenderness or distention. MUSCULOSKELETAL: Normal gait. Without gross motor or sensory deficit. PSYCHIATRIC: Patient has a normal mood and affect. Behavior is normal. Judgment and thought content normal.  Vitals:   03/07/24 1423  BP: 114/66  Pulse: 70  Resp: 16  SpO2: 98%  Weight: 212 lb 11.2 oz (96.5 kg)  Height: 5' 8 (1.727 m)    Body mass index is 32.34 kg/m.  Recent Results (from the past 2160 hours)  Lipase     Status: None   Collection Time: 01/09/24  9:50 AM  Result Value Ref Range   Lipase 27 7 - 60 U/L  H. pylori breath test     Status: None   Collection Time: 01/09/24  9:50 AM  Result Value Ref Range   H. pylori Breath Test NOT DETECTED NOT DETECTED    Comment: . Antimicrobials, proton pump inhibitors, and bismuth preparations  are known to suppress H. pylori, and  ingestion of these prior to H. pylori diagnostic testing may lead to false negative results. If clinically  indicated, the test may be repeated on a new specimen obtained two weeks after discontinuing treatment. However, a positive result is still clinically valid.   Lipid panel     Status: Abnormal   Collection Time: 03/05/24  8:36 AM  Result Value Ref Range   Cholesterol, Total 98 (L) 100 - 199 mg/dL   Triglycerides 54 0 - 149 mg/dL   HDL 44 >60 mg/dL   VLDL Cholesterol Cal 13 5 - 40 mg/dL   LDL Chol Calc (NIH) 41 0 - 99 mg/dL   Chol/HDL Ratio 2.2 0.0 - 5.0 ratio    Comment:                                   T. Chol/HDL Ratio                                             Men  Women                               1/2 Avg.Risk  3.4    3.3                                   Avg.Risk  5.0    4.4                                2X Avg.Risk  9.6    7.1                                3X Avg.Risk 23.4   11.0   Hepatic function panel     Status: Abnormal  Collection Time: 03/05/24  8:36 AM  Result Value Ref Range   Total Protein 5.9 (L) 6.0 - 8.5 g/dL   Albumin  4.0 3.9 - 4.9 g/dL   Bilirubin Total 0.4 0.0 - 1.2 mg/dL   Bilirubin, Direct 9.84 0.00 - 0.40 mg/dL   Alkaline Phosphatase 93 44 - 121 IU/L   AST 21 0 - 40 IU/L   ALT 17 0 - 44 IU/L     PHQ2/9:    03/07/2024    2:21 PM 01/09/2024    9:00 AM 11/10/2023   10:18 AM 02/03/2023    1:43 PM 07/29/2022   10:59 AM  Depression screen PHQ 2/9  Decreased Interest 0 2 0 0 0  Down, Depressed, Hopeless 0 2 0 0 0  PHQ - 2 Score 0 4 0 0 0  Altered sleeping 0 2 0  0  Tired, decreased energy 0 2 0  0  Change in appetite 0 0 0  0  Feeling bad or failure about yourself  0 1 0  0  Trouble concentrating 0 0 0  0  Moving slowly or fidgety/restless 0 0 0  0  Suicidal thoughts 0 0 0  0  PHQ-9 Score 0 9 0  0  Difficult doing work/chores Not difficult at all Somewhat difficult Not difficult at all  Not  difficult at all    phq 9 is negative  Fall Risk:    03/07/2024    2:16 PM 01/09/2024    9:00 AM 11/10/2023   10:17 AM 02/03/2023    1:39 PM 07/29/2022   10:59 AM  Fall Risk   Falls in the past year? 0 0 0 0 0  Number falls in past yr: 0 0 0 0 0  Injury with Fall? 0 0 0 0 0  Risk for fall due to : No Fall Risks No Fall Risks No Fall Risks  No Fall Risks  Follow up Falls evaluation completed Falls prevention discussed;Education provided;Falls evaluation completed Falls prevention discussed;Education provided;Falls evaluation completed Education provided;Falls prevention discussed Falls prevention discussed;Education provided;Falls evaluation completed      Data saved with a previous flowsheet row definition     Assessment & Plan Adjustment disorder with mixed anxiety and depressed mood Improvement noted despite stressors. Prefers non-pharmacological interventions. Acknowledges emotional challenges. - Continue non-pharmacological stress management: walking, enjoyable activities. - Consider Buspar PRN for anxiety if needed.  Erectile Dysfunction Responded well to Cialis  5 mg Sending refill to pharmacy

## 2024-03-08 ENCOUNTER — Telehealth: Payer: Self-pay | Admitting: Pharmacy Technician

## 2024-03-08 ENCOUNTER — Other Ambulatory Visit (HOSPITAL_COMMUNITY): Payer: Self-pay

## 2024-03-08 NOTE — Telephone Encounter (Signed)
 Pharmacy Patient Advocate Encounter   Received notification from CoverMyMeds that prior authorization for Tadalafil  5MG  tablets is required/requested.   Insurance verification completed.   The patient is insured through [facility] .   Per test claim: PA required; PA submitted to above mentioned insurance via CoverMyMeds Key/confirmation #/EOC [id number] Status is pending

## 2024-03-09 NOTE — Telephone Encounter (Signed)
 Pharmacy Patient Advocate Encounter  Received notification from [employer] that Prior Authorization for Tadalafil  5MG  tablets has been DENIED.  Full denial letter will be uploaded to the media tab. See denial reason below.    PA #/Case ID/Reference #: [id number]

## 2024-03-12 ENCOUNTER — Other Ambulatory Visit: Payer: Self-pay

## 2024-03-23 ENCOUNTER — Ambulatory Visit

## 2024-03-23 VITALS — BP 114/66 | Ht 68.0 in | Wt 208.0 lb

## 2024-03-23 DIAGNOSIS — Z0000 Encounter for general adult medical examination without abnormal findings: Secondary | ICD-10-CM

## 2024-03-23 NOTE — Patient Instructions (Signed)
 Mr. Schellenberg , Thank you for taking time out of your busy schedule to complete your Annual Wellness Visit with me. I enjoyed our conversation and look forward to speaking with you again next year. I, as well as your care team,  appreciate your ongoing commitment to your health goals. Please review the following plan we discussed and let me know if I can assist you in the future. Your Game plan/ To Do List    Referrals: If you haven't heard from the office you've been referred to, please reach out to them at the phone provided.   Follow up Visits: We will see or speak with you next year for your Next Medicare AWV with our clinical staff Have you seen your provider in the last 6 months (3 months if uncontrolled diabetes)? Yes  Clinician Recommendations:  Aim for 30 minutes of exercise or brisk walking, 6-8 glasses of water, and 5 servings of fruits and vegetables each day.       This is a list of the screenings recommended for you:  Health Maintenance  Topic Date Due   Flu Shot  03/23/2024   COVID-19 Vaccine (7 - 2024-25 season) 03/23/2024*   Medicare Annual Wellness Visit  03/23/2025   Colon Cancer Screening  08/09/2027   DTaP/Tdap/Td vaccine (3 - Td or Tdap) 10/05/2032   Pneumococcal Vaccine for age over 90  Completed   Hepatitis C Screening  Completed   Zoster (Shingles) Vaccine  Completed   Hepatitis B Vaccine  Aged Out   HPV Vaccine  Aged Out   Meningitis B Vaccine  Aged Out  *Topic was postponed. The date shown is not the original due date.    Advanced directives: (Declined) Advance directive discussed with you today. Even though you declined this today, please call our office should you change your mind, and we can give you the proper paperwork for you to fill out. Advance Care Planning is important because it:  [x]  Makes sure you receive the medical care that is consistent with your values, goals, and preferences  [x]  It provides guidance to your family and loved ones and  reduces their decisional burden about whether or not they are making the right decisions based on your wishes.  Follow the link provided in your after visit summary or read over the paperwork we have mailed to you to help you started getting your Advance Directives in place. If you need assistance in completing these, please reach out to us  so that we can help you!  See attachments for Preventive Care and Fall Prevention Tips.

## 2024-03-23 NOTE — Progress Notes (Signed)
 Because this visit was a virtual/telehealth visit,  certain criteria was not obtained, such a blood pressure, CBG if applicable, and timed get up and go. Any medications not marked as taking were not mentioned during the medication reconciliation part of the visit. Any vitals not documented were not able to be obtained due to this being a telehealth visit or patient was unable to self-report a recent blood pressure reading due to a lack of equipment at home via telehealth. Vitals that have been documented are verbally provided by the patient.   This visit was performed by a medical professional under my direct supervision. I was immediately available for consultation/collaboration. I have reviewed and agree with the Annual Wellness Visit documentation.  Subjective:   Richard Mueller [facility] is a 68 y.o. who presents for a Medicare Wellness preventive visit.  As a reminder, Annual Wellness Visits don't include a physical exam, and some assessments may be limited, especially if this visit is performed virtually. We may recommend an in-person follow-up visit with your provider if needed.  Visit Complete: Virtual I connected with  Richard Mueller [facility] on 03/23/24 by a audio enabled telemedicine application and verified that I am speaking with the correct person using two identifiers.  Patient Location: Home  Provider Location: Home Office  I discussed the limitations of evaluation and management by telemedicine. The patient expressed understanding and agreed to proceed.  Vital Signs: Because this visit was a virtual/telehealth visit, some criteria may be missing or patient reported. Any vitals not documented were not able to be obtained and vitals that have been documented are patient reported.  VideoDeclined- This patient declined Librarian, academic. Therefore the visit was completed with audio only.  Persons Participating in Visit: Patient.  AWV Questionnaire:  Yes: Patient Medicare AWV questionnaire was completed by the patient on 03/19/2024; I have confirmed that all information answered by patient is correct and no changes since this date.  Cardiac Risk Factors include: male gender;hypertension;advanced age (>7men, >75 women);dyslipidemia     Objective:    Today's Vitals   03/23/24 0822  BP: 114/66  Weight: 208 lb (94.3 kg)  Height: 5' 8 (1.727 m)   Body mass index is 31.63 kg/m.     03/23/2024    8:26 AM 02/03/2023    1:45 PM 09/16/2021    8:20 AM 10/10/2020    3:05 PM 10/08/2020    8:47 AM 08/08/2020    9:16 AM 04/05/2017   10:34 AM  Advanced Directives  Does Patient Have a Medical Advance Directive? Yes Yes Yes Yes Yes Yes Yes   Type of Special educational needs teacher of [place];Living will Healthcare Power of [employer] of [place];Living will Healthcare Power of [place];Living will Healthcare Power of [place];Living will Healthcare Power of [place];Living will  Does patient want to make changes to medical advance directive? No - Patient declined  No - Patient declined No - Patient declined  No - Patient declined   Copy of Healthcare Power of Attorney in Chart?   No - copy requested No - copy requested No - copy requested Yes - validated most recent copy scanned in chart (See row information)      Data saved with a previous flowsheet row definition    Current Medications (verified) Outpatient Encounter Medications as of 03/23/2024  Medication Sig   acetaminophen  (TYLENOL ) 500 MG tablet Take 1,000 mg by mouth every 6 (six) hours as needed.   Ascorbic Acid (VITAMIN C PO)  Take 1 tablet by mouth every other day.   aspirin  81 MG tablet Take 1 tablet (81 mg total) by mouth daily. Okay to restart on 09/30/21   atorvastatin  (LIPITOR) 40 MG tablet Take 1 tablet (40 mg total) by mouth daily.   carvedilol  (COREG ) 3.125 MG tablet Take 1 tablet (3.125 mg total) by mouth 2 (two) times daily.   Cholecalciferol (VITAMIN D) 50  MCG (2000 UT) tablet Take 2,000 Units by mouth daily.   ezetimibe  (ZETIA ) 10 MG tablet Take 1 tablet (10 mg total) by mouth daily.   famotidine  (PEPCID ) 10 MG tablet Take 10 mg by mouth 2 (two) times daily.   losartan  (COZAAR ) 50 MG tablet Take 1 tablet (50 mg total) by mouth daily.   MAGNESIUM PO Take 1 tablet by mouth every other day.   Multiple Vitamin (MULTIVITAMIN) tablet Take 1 tablet by mouth daily.   naproxen  (NAPROSYN ) 500 MG tablet Take 500 mg by mouth 2 (two) times daily as needed.   tadalafil  (CIALIS ) 5 MG tablet Take 1 tablet (5 mg total) by mouth daily.   clobetasol  cream (TEMOVATE ) 0.05 % Apply small Application topically 2 (two) times daily for 1 to 2 weeks as needed   levocetirizine (XYZAL ) 5 MG tablet Take 1 tablet (5 mg total) by mouth every evening. (Patient not taking: Reported on 03/23/2024)   No facility-administered encounter medications on file as of 03/23/2024.    Allergies (verified) Lisinopril   History: Past Medical History:  Diagnosis Date   Allergic rhinitis, cause unspecified    Allergy    Bronchitis    Cancer (HCC) 2020   skin cancer   Hyperlipidemia    Hypertension    Metabolic syndrome    Obesity, unspecified    Osteoarthritis    Osteoarthrosis, unspecified whether generalized or localized, unspecified site    Pneumonia    Right upper quadrant pain    Past Surgical History:  Procedure Laterality Date   APPENDECTOMY  April 2020   COLONOSCOPY WITH PROPOFOL  N/A 08/08/2020   Procedure: COLONOSCOPY WITH PROPOFOL ;  Surgeon: Jinny Carmine, MD;  Location: [facility];  Service: Endoscopy;  Laterality: N/A;  priority 4   LAPAROSCOPIC APPENDECTOMY N/A 11/28/2018   Procedure: APPENDECTOMY LAPAROSCOPIC;  Surgeon: Jordis Laneta FALCON, MD;  Location: [facility];  Service: General;  Laterality: N/A;   Open Lumbar Five-Sacral One replacement of loose screws, Lumbar Five-Pelvis posterolateral instrumented fusion  09/22/2021   Dr. Cheryle   ORTHOPEDIC SURGERY      POLYPECTOMY  08/08/2020   Procedure: POLYPECTOMY;  Surgeon: Jinny Carmine, MD;  Location: [facility];  Service: Endoscopy;;   SKIN CANCER DESTRUCTION Left 03/06/2015   Hand- Dr. Arlyss   SKIN CANCER EXCISION Left 08/2018   Dr. Arlyss on Left Arm    SPINE SURGERY  February 2023   TRANSFORAMINAL LUMBAR INTERBODY FUSION W/ MIS 1 LEVEL Left 10/10/2020   Procedure: Right Lumbar Five Sacral One Minimally invasive transforaminal lumbar interbody fusion;  Surgeon: Cheryle Debby LABOR, MD;  Location: [facility];  Service: Neurosurgery;  Laterality: Left;  posterior   Family History  Problem Relation Age of Onset   Heart disease Mother    Heart attack Mother    COPD Mother    Heart disease Father    Hypertension Father    COPD Father    Cancer Father         Prostate and Skin   COPD Sister    Suicidality Brother    CAD Brother  Depression Daughter    Cirrhosis Daughter    Social History   Socioeconomic History   Marital status: Married    Spouse name: Arland   Number of children: 2   Years of education: Not on file   Highest education level: Associate degree: occupational, Scientist, product/process development, or vocational program  Occupational History   Not on file  Tobacco Use   Smoking status: Former    Current packs/day: 0.00    Average packs/day: 1 pack/day for 15.0 years (15.0 ttl pk-yrs)    Types: Cigarettes    Start date: 08/24/1987    Quit date: 08/26/2002    Years since quitting: 21.5   Smokeless tobacco: Never  Vaping Use   Vaping status: Never Used  Substance and Sexual Activity   Alcohol use: Yes    Comment: Couple of beer's a week socially   Drug use: No   Sexual activity: Yes    Partners: Female    Birth control/protection: None  Other Topics Concern   Not on file  Social History Narrative   Not on file   Social Drivers of Health   Financial Resource Strain: Low Risk  (03/23/2024)   Overall Financial Resource Strain (CARDIA)    Difficulty of Paying Living Expenses: Not  very hard  Food Insecurity: No Food Insecurity (03/23/2024)   Hunger Vital Sign    Worried About Running Out of Food in the Last Year: Never true    Ran Out of Food in the Last Year: Never true  Transportation Needs: No Transportation Needs (03/23/2024)   PRAPARE - Administrator, Civil Service (Medical): No    Lack of Transportation (Non-Medical): No  Physical Activity: Insufficiently Active (03/23/2024)   Exercise Vital Sign    Days of Exercise per Week: 4 days    Minutes of Exercise per Session: 30 min  Stress: No Stress Concern Present (03/23/2024)   [employer] of Occupational Health - Occupational Stress Questionnaire    Feeling of Stress: Only a little  Social Connections: Socially Integrated (03/23/2024)   Social Connection and Isolation Panel    Frequency of Communication with Friends and Family: More than three times a week    Frequency of Social Gatherings with Friends and Family: Twice a week    Attends Religious Services: More than 4 times per year    Active Member of [employer] or Organizations: Yes    Attends Engineer, structural: More than 4 times per year    Marital Status: Married    Tobacco Counseling Counseling given: Not Answered    Clinical Intake:  Pre-visit preparation completed: Yes  Pain : No/denies pain     BMI - recorded: 31.63 Nutritional Status: BMI > 30  Obese Nutritional Risks: None Diabetes: No  Lab Results  Component Value Date   HGBA1C 5.5 12/18/2019   HGBA1C 5.5 11/27/2018   HGBA1C 5.9 06/22/2016     How often do you need to have someone help you when you read instructions, pamphlets, or other written materials from your doctor or pharmacy?: 1 - Never What is the last grade level you completed in school?: 2 years of college  Interpreter Needed?: No  Information entered by :: Ulisses Vondrak,CMA   Activities of Daily Living     03/19/2024   10:50 AM  In your present state of health, do you have any difficulty  performing the following activities:  Hearing? 0  Vision? 0  Difficulty concentrating or making decisions? 0  Walking or  climbing stairs? 0  Dressing or bathing? 0  Doing errands, shopping? 0  Preparing Food and eating ? N  Using the Toilet? N  In the past six months, have you accidently leaked urine? N  Do you have problems with loss of bowel control? N  Managing your Medications? N  Managing your Finances? N  Housekeeping or managing your Housekeeping? N    Patient Care Team: Sowles, Krichna, MD as PCP - General (Family Medicine) Cheryle Debby LABOR, MD as Consulting Physician (Neurosurgery) Jinny Carmine, MD as Consulting Physician (Gastroenterology) Jordis Laneta FALCON, MD as Consulting Physician (General Surgery) Darron Deatrice LABOR, MD as Consulting Physician (Cardiology) Burnie Donzell Hollow, MD as Referring Physician (Ophthalmology)  I have updated your Care Teams any recent Medical Services you may have received from other providers in the past year.     Assessment:   This is a routine wellness examination for Richard Mueller.  Hearing/Vision screen Hearing Screening - Comments:: Wears hearing aids  Vision Screening - Comments:: Patient wears glasses    Goals Addressed             This Visit's Progress    Patient Stated       To travel more       Depression Screen     03/23/2024    8:27 AM 03/07/2024    2:21 PM 01/09/2024    9:00 AM 11/10/2023   10:18 AM 02/03/2023    1:43 PM 07/29/2022   10:59 AM 04/14/2022    7:44 AM  PHQ 2/9 Scores  PHQ - 2 Score 3 0 4 0 0 0 0  PHQ- 9 Score 3 0 9 0  0 0    Fall Risk     03/19/2024   10:50 AM 03/07/2024    2:16 PM 01/09/2024    9:00 AM 11/10/2023   10:17 AM 02/03/2023    1:39 PM  Fall Risk   Falls in the past year? 0 0 0 0 0  Number falls in past yr: 0 0 0 0 0  Injury with Fall? 0 0 0 0 0  Risk for fall due to : No Fall Risks No Fall Risks No Fall Risks No Fall Risks   Follow up Falls prevention discussed;Falls evaluation  completed Falls evaluation completed Falls prevention discussed;Education provided;Falls evaluation completed Falls prevention discussed;Education provided;Falls evaluation completed Education provided;Falls prevention discussed    MEDICARE RISK AT HOME:  Medicare Risk at Home Any stairs in or around the home?: (Patient-Rptd) Yes If so, are there any without handrails?: (Patient-Rptd) Yes Home free of loose throw rugs in walkways, pet beds, electrical cords, etc?: (Patient-Rptd) Yes Adequate lighting in your home to reduce risk of falls?: (Patient-Rptd) Yes Life alert?: (Patient-Rptd) No Use of a cane, walker or w/c?: (Patient-Rptd) No Grab bars in the bathroom?: (Patient-Rptd) No Shower chair or bench in shower?: (Patient-Rptd) No Elevated toilet seat or a handicapped toilet?: (Patient-Rptd) No  TIMED UP AND GO:  Was the test performed?  No  Cognitive Function: 6CIT completed        03/23/2024    8:24 AM 02/03/2023    1:47 PM  6CIT Screen  What Year? 0 points 0 points  What month? 0 points 0 points  What time? 0 points 0 points  Count back from 20 0 points 0 points  Months in reverse 0 points 0 points  Repeat phrase 0 points 0 points  Total Score 0 points 0 points    Immunizations Immunization History  Administered Date(s) Administered   Fluad Quad(high Dose 65+) 06/11/2021   Influenza, High Dose Seasonal PF 06/08/2023   Influenza,inj,Quad PF,6+ Mos 05/26/2015, 06/22/2016, 06/21/2017, 06/12/2018, 06/05/2019, 04/14/2022   Influenza-Unspecified 06/12/2018   Moderna Covid-19 Fall Seasonal Vaccine 20yrs & older 06/08/2023   PFIZER Comirnaty (Gray Top)Covid-19 Tri-Sucrose Vaccine 01/20/2021   PFIZER(Purple Top)SARS-COV-2 Vaccination 10/23/2019, 11/20/2019, 05/27/2020   PNEUMOCOCCAL CONJUGATE-20 03/17/2021   Pfizer Covid-19 Vaccine Bivalent Booster 63yrs & up 05/15/2021   Pneumococcal Polysaccharide-23 09/21/2016   Tdap 04/12/2012, 10/05/2022   Zoster Recombinant(Shingrix)  06/18/2020, 08/27/2020   Zoster, Live 12/16/2015    Screening Tests Health Maintenance  Topic Date Due   INFLUENZA VACCINE  03/23/2024   COVID-19 Vaccine (7 - 2024-25 season) 03/23/2024 (Originally 12/07/2023)   Medicare Annual Wellness (AWV)  03/23/2025   Colonoscopy  08/09/2027   DTaP/Tdap/Td (3 - Td or Tdap) 10/05/2032   Pneumococcal Vaccine: 50+ Years  Completed   Hepatitis C Screening  Completed   Zoster Vaccines- Shingrix  Completed   Hepatitis B Vaccines  Aged Out   HPV VACCINES  Aged Out   Meningococcal B Vaccine  Aged Out    Health Maintenance  Health Maintenance Due  Topic Date Due   INFLUENZA VACCINE  03/23/2024   Health Maintenance Items Addressed:   Additional Screening:  Vision Screening: Recommended annual ophthalmology exams for early detection of glaucoma and other disorders of the eye. Would you like a referral to an eye doctor? No    Dental Screening: Recommended annual dental exams for proper oral hygiene  Community Resource Referral / Chronic Care Management: CRR required this visit?  No   CCM required this visit?  No   Plan:    I have personally reviewed and noted the following in the patient's chart:   Medical and social history Use of alcohol, tobacco or illicit drugs  Current medications and supplements including opioid prescriptions. Patient is not currently taking opioid prescriptions. Functional ability and status Nutritional status Physical activity Advanced directives List of other physicians Hospitalizations, surgeries, and ER visits in previous 12 months Vitals Screenings to include cognitive, depression, and falls Referrals and appointments  In addition, I have reviewed and discussed with patient certain preventive protocols, quality metrics, and best practice recommendations. A written personalized care plan for preventive services as well as general preventive health recommendations were provided to patient.   Richard Mueller Right, [place]   03/23/2024   After Visit Summary: (MyChart) Due to this being a telephonic visit, the after visit summary with patients personalized plan was offered to patient via MyChart   Notes: Nothing significant to report at this time.

## 2024-04-26 ENCOUNTER — Other Ambulatory Visit: Payer: Self-pay | Admitting: Student

## 2024-04-26 DIAGNOSIS — M5416 Radiculopathy, lumbar region: Secondary | ICD-10-CM

## 2024-05-01 ENCOUNTER — Ambulatory Visit
Admission: RE | Admit: 2024-05-01 | Discharge: 2024-05-01 | Disposition: A | Discharge status: Home / Self Care | Source: Ambulatory Visit | Attending: Student | Admitting: Student

## 2024-05-01 DIAGNOSIS — M5416 Radiculopathy, lumbar region: Secondary | ICD-10-CM | POA: Insufficient documentation

## 2024-05-29 ENCOUNTER — Other Ambulatory Visit: Payer: Self-pay

## 2024-05-29 ENCOUNTER — Telehealth: Payer: Self-pay

## 2024-05-29 MED ORDER — FLUZONE HIGH-DOSE 0.5 ML IM SUSY
0.5000 mL | PREFILLED_SYRINGE | Freq: Once | INTRAMUSCULAR | 0 refills | Status: AC
  Filled 2024-05-29: qty 0.5, 1d supply, fill #0

## 2024-05-29 MED ORDER — COMIRNATY 30 MCG/0.3ML IM SUSY
0.3000 mL | PREFILLED_SYRINGE | Freq: Once | INTRAMUSCULAR | 0 refills | Status: AC
  Filled 2024-05-29: qty 0.3, 1d supply, fill #0

## 2024-05-29 NOTE — Telephone Encounter (Signed)
 Copied from CRM [id number]. Topic: General - Other >> May 29, 2024  7:58 AM Richard Mueller wrote: Reason for CRM: The patient would like to be contacted by a member of administrative staff to review their immunization records when possible and discuss their questions. Please contact further if/when possible

## 2024-05-29 NOTE — Telephone Encounter (Signed)
Called pt back unable to leave vm

## 2024-06-13 ENCOUNTER — Telehealth: Payer: Self-pay | Admitting: Cardiovascular Disease

## 2024-06-13 DIAGNOSIS — I10 Essential (primary) hypertension: Secondary | ICD-10-CM

## 2024-06-13 MED ORDER — LOSARTAN POTASSIUM 50 MG PO TABS: 50.0000 mg | ORAL_TABLET | Freq: Every day | ORAL | 1 refills | Status: AC

## 2024-06-13 NOTE — Telephone Encounter (Signed)
*  STAT* If patient is at the pharmacy, call can be transferred to refill team.   1. Which medications need to be refilled? (please list name of each medication and dose if known) losartan  (COZAAR ) 50 MG tablet   2. Which pharmacy/location (including street and city if local pharmacy) is medication to be sent to? [id number] [place], [place] - [address] AT [facility] [address] Phone: [phone]  Fax: [phone]      3. Do they need a 30 day or 90 day supply? 90 Pt is out of town taking care of his brother and has stayed longer than expected. Please send to above pharmacy. Pt is out of medication

## 2024-06-13 NOTE — Telephone Encounter (Signed)
 RX sent in

## 2024-06-14 ENCOUNTER — Telehealth: Payer: Self-pay | Admitting: Cardiovascular Disease

## 2024-06-14 MED ORDER — ATORVASTATIN CALCIUM 40 MG PO TABS: 40.0000 mg | ORAL_TABLET | Freq: Every day | ORAL | 1 refills | Status: AC

## 2024-06-14 NOTE — Telephone Encounter (Signed)
*  STAT* If patient is at the pharmacy, call can be transferred to refill team.   1. Which medications need to be refilled? (please list name of each medication and dose if known)   atorvastatin  (LIPITOR) 40 MG tablet   2. Would you like to learn more about the convenience, safety, & potential cost savings by using the [facility]?   3. Are you open to using the [facility] (Type [facility]. ).  4. Which pharmacy/location (including street and city if local pharmacy) is medication to be sent to?  [id number] - [place], [place] - [address] AT [facility] [address]   5. Do they need a 30 day or 90 day supply?   90 day  Patient stated he only has 1 tablet left and is out of town.

## 2024-06-14 NOTE — Telephone Encounter (Signed)
 RX sent in

## 2024-07-02 ENCOUNTER — Telehealth: Payer: Self-pay | Admitting: Cardiovascular Disease

## 2024-07-02 NOTE — Telephone Encounter (Signed)
 Pt called in stating recently he has started to hear his heartbeat loud through his ears, more frequently in his right ear. He denies the heartbeat sounding or feeling like its skipping or irregular. He denies SOB, CP and/or lightheadedness. Please advise.

## 2024-07-02 NOTE — Telephone Encounter (Signed)
 Returned pt's call; identified patient using two identifiers; pt is concerned due to a strong family history of heart disease; his younger brother just had right carotid surgery and has had a triple by pass; pt states hearing the heartbeat started in his right ear only, but has progressed to both ears.  Pt states he went to ENT today and doesn't have tinnitus.  I explained that this could be an indication of high blood pressure and asked if he had taken his blood pressure recently;  pt states that on Friday his BP was 150/80; today while on phone his BP is 140/64 HR 80 which is high for him; per his medical record pt is usually 130s-114/60s.  Pt states that he has been taking his BP medication regularly.  I explained that I would reach out to his provider for further recommendations to see if he needed an adjustment in his medications or if he needed to come in to the office for further evaluation;  I reassured him that our office would call him back with Provider's recommendations/advise.  Pt verbalized understanding.

## 2024-07-05 NOTE — Telephone Encounter (Signed)
 He needs an office visit with me or APP to discuss and make sure he does not have any carotid bruits.

## 2024-07-05 NOTE — Telephone Encounter (Signed)
 Called and spoke with the patient. He was offered an appointment tomorrow but stated that he would rather wait to see Dr. Darron next week. An appointment has been made for 11/21. He has been advised to call back for any worsening symptoms or concerns.

## 2024-07-11 ENCOUNTER — Telehealth (HOSPITAL_BASED_OUTPATIENT_CLINIC_OR_DEPARTMENT_OTHER): Payer: Self-pay

## 2024-07-11 NOTE — Telephone Encounter (Signed)
   Pre-operative Risk Assessment    Patient Name: Richard Mueller [facility] III  DOB: Sep 23, 1955 MRN: [id number]   Date of last office visit: 01/12/2024 with Dr. Darron Date of next office visit: 07/13/2024 with Dr. Darron   Request for Surgical Clearance    Procedure:  L2-3 Laminectomy w/removal of hardware, redo Post Lat Fusion   Date of Surgery:  Clearance 08/31/24                                 Surgeon:  Dr. Alm Molt Surgeon's Group or Practice Name:  [facility]  Phone number:  [phone] [id number] - [place]  Fax number:  [phone]   Type of Clearance Requested:   - Medical  - Pharmacy:  Hold Aspirin  -Does not specify   Type of Anesthesia:  General    Additional requests/questions:  None  SignedPatrcia Iverson CROME   07/11/2024, 6:14 PM

## 2024-07-12 NOTE — Telephone Encounter (Signed)
   Name: Richard Mueller  DOB: January 29, 1956  MRN: [id number]  Primary Cardiologist: None  Chart reviewed as part of pre-operative protocol coverage. Because of Richard Mueller's past medical history and time since last visit, he will require a follow-up in-office visit in order to better assess preoperative cardiovascular risk.  Pre-op covering staff: - Please schedule appointment and call patient to inform them. If patient already had an upcoming appointment within acceptable timeframe, please add pre-op clearance to the appointment notes so provider is aware. - Please contact requesting surgeon's office via preferred method (i.e, phone, fax) to inform them of need for appointment prior to surgery.  He can hold aspirin  for 5 to 7 days prior to procedure and resume when medically safe to do so as long as he is asymptomatic at the time of office visit.  Orren LOISE Fabry, PA-C  07/12/2024, 8:21 AM

## 2024-07-12 NOTE — Telephone Encounter (Signed)
 Appt notes updated to reflect need preop clearance.

## 2024-07-13 ENCOUNTER — Ambulatory Visit: Attending: Cardiovascular Disease | Admitting: Cardiovascular Disease

## 2024-07-13 ENCOUNTER — Encounter: Payer: Self-pay | Admitting: Cardiovascular Disease

## 2024-07-13 VITALS — BP 124/70 | HR 68 | Ht 68.0 in | Wt 218.2 lb

## 2024-07-13 DIAGNOSIS — R0989 Other specified symptoms and signs involving the circulatory and respiratory systems: Secondary | ICD-10-CM | POA: Diagnosis present

## 2024-07-13 DIAGNOSIS — I471 Supraventricular tachycardia, unspecified: Secondary | ICD-10-CM | POA: Insufficient documentation

## 2024-07-13 DIAGNOSIS — Z01810 Encounter for preprocedural cardiovascular examination: Secondary | ICD-10-CM | POA: Diagnosis present

## 2024-07-13 DIAGNOSIS — I10 Essential (primary) hypertension: Secondary | ICD-10-CM | POA: Diagnosis present

## 2024-07-13 DIAGNOSIS — E7800 Pure hypercholesterolemia, unspecified: Secondary | ICD-10-CM | POA: Insufficient documentation

## 2024-07-13 DIAGNOSIS — R931 Abnormal findings on diagnostic imaging of heart and coronary circulation: Secondary | ICD-10-CM | POA: Diagnosis present

## 2024-07-13 DIAGNOSIS — I700 Atherosclerosis of aorta: Secondary | ICD-10-CM | POA: Diagnosis present

## 2024-07-13 NOTE — Telephone Encounter (Signed)
   Patient Name: Richard Mueller [facility]  DOB: November 23, 1955 MRN: [id number]  Primary Cardiologist: None  Chart reviewed as part of pre-operative protocol coverage. Given past medical history and time since last visit, based on ACC/AHA guidelines, Mohamad Bruso III is at acceptable risk for the planned procedure without further cardiovascular testing.   Per Dr.Arida 07/13/2024 Preop cardiovascular evaluation for lumbar spine surgery: He has no anginal symptoms and his EKG is unchanged. He can proceed with an overall low risk. Aspirin  can be held 7 days before.   The patient was advised that if he develops new symptoms prior to surgery to contact our office to arrange for a follow-up visit, and he verbalized understanding.  I will route this recommendation to the requesting party via Epic fax function and remove from pre-op pool.  Please call with questions.  Lamarr Satterfield, NP 07/13/2024, 2:13 PM

## 2024-07-13 NOTE — Progress Notes (Signed)
 Cardiology Office Note   Date:  07/13/2024   ID:  Richard Mueller [place], DOB 06/09/1956, MRN [id number]  PCP:  [name], MD  Cardiologist:   [name], MD   Chief Complaint  Patient presents with   Follow-up    Irregular Heart rhythm/Preop clearance. Meds reviewed verbally with pt.      History of Present Illness: Richard Mueller is a 68 y.o. male who presents for a follow-up regarding moderate nonobstructive coronary artery disease and palpitations.  He does have a family history of CAD.  He is known to have aortic atherosclerosis on previous CT imaging. He has history of palpitations.  ZIO monitor in 2021 showed 1 episode of wide-complex tachycardia lasting only 4 beats and was likely due to SVT with aberrancy although slow nonsustained ventricular tachycardia could not be excluded.  He was also noted to have short runs of SVT the longest lasted 16 beats.  Echocardiogram in August 2021 showed normal LV systolic function with mild mitral regurgitation and no significant pulmonary hypertension.  Cardiac CTA in September 2021 showed a calcium  score of 857 with evidence of moderate stenosis in the proximal LAD as well as mild left circumflex disease.  None of the lesions were significant by FFR.  He had lumbar spine surgery twice and continues to struggle with low back pain.    He reports recent symptoms of hearing his heartbeat loudly initially in the right ear and then it became in both the ears.  This happens whether he has his hearing aid on or not.  No palpitations.  He denies chest pain or worsening dyspnea.  His brother had recent carotid surgery.  He is scheduled for another lumbar spine surgery in January.   Past Medical History:  Diagnosis Date   Allergic rhinitis, cause unspecified    Allergy    Bronchitis    Cancer (HCC) 2020   skin cancer   Hyperlipidemia    Hypertension    Metabolic syndrome    Obesity, unspecified    Osteoarthritis     Osteoarthrosis, unspecified whether generalized or localized, unspecified site    Pneumonia    Right upper quadrant pain     Past Surgical History:  Procedure Laterality Date   APPENDECTOMY  April 2020   COLONOSCOPY WITH PROPOFOL  N/A 08/08/2020   Procedure: COLONOSCOPY WITH PROPOFOL ;  Surgeon: Jinny Carmine, MD;  Location: [facility];  Service: Endoscopy;  Laterality: N/A;  priority 4   LAPAROSCOPIC APPENDECTOMY N/A 11/28/2018   Procedure: APPENDECTOMY LAPAROSCOPIC;  Surgeon: Jordis Laneta FALCON, MD;  Location: [facility];  Service: General;  Laterality: N/A;   Open Lumbar Five-Sacral One replacement of loose screws, Lumbar Five-Pelvis posterolateral instrumented fusion  09/22/2021   Dr. Cheryle   ORTHOPEDIC SURGERY     POLYPECTOMY  08/08/2020   Procedure: POLYPECTOMY;  Surgeon: Jinny Carmine, MD;  Location: [facility];  Service: Endoscopy;;   SKIN CANCER DESTRUCTION Left 03/06/2015   Hand- Dr. Arlyss   SKIN CANCER EXCISION Left 08/2018   Dr. Arlyss on Left Arm    SPINE SURGERY  February 2023   TRANSFORAMINAL LUMBAR INTERBODY FUSION W/ MIS 1 LEVEL Left 10/10/2020   Procedure: Right Lumbar Five Sacral One Minimally invasive transforaminal lumbar interbody fusion;  Surgeon: Cheryle Debby LABOR, MD;  Location: [facility] OR;  Service: Neurosurgery;  Laterality: Left;  posterior     Current Outpatient Medications  Medication Sig Dispense Refill   acetaminophen  (TYLENOL ) 500 MG tablet Take 1,000  mg by mouth every 6 (six) hours as needed.     Ascorbic Acid (VITAMIN C PO) Take 1 tablet by mouth every other day.     aspirin  81 MG tablet Take 1 tablet (81 mg total) by mouth daily. Okay to restart on 09/30/21 30 tablet    atorvastatin  (LIPITOR) 40 MG tablet Take 1 tablet (40 mg total) by mouth daily. 90 tablet 1   carvedilol  (COREG ) 3.125 MG tablet Take 1 tablet (3.125 mg total) by mouth 2 (two) times daily. 180 tablet 3   Cholecalciferol (VITAMIN D) 50 MCG (2000 UT) tablet Take 2,000 Units  by mouth daily.     ezetimibe  (ZETIA ) 10 MG tablet Take 1 tablet (10 mg total) by mouth daily. 90 tablet 3   losartan  (COZAAR ) 50 MG tablet Take 1 tablet (50 mg total) by mouth daily. 90 tablet 1   MAGNESIUM PO Take 1 tablet by mouth every other day.     Multiple Vitamin (MULTIVITAMIN) tablet Take 1 tablet by mouth daily.     naproxen  (NAPROSYN ) 500 MG tablet Take 500 mg by mouth 2 (two) times daily as needed.     tadalafil  (CIALIS ) 5 MG tablet Take 1 tablet (5 mg total) by mouth daily. 90 tablet 1   famotidine  (PEPCID ) 10 MG tablet Take 10 mg by mouth 2 (two) times daily. (Patient not taking: Reported on 07/13/2024)     levocetirizine (XYZAL ) 5 MG tablet Take 1 tablet (5 mg total) by mouth every evening. (Patient not taking: Reported on 07/13/2024) 30 tablet 1   No current facility-administered medications for this visit.    Allergies:   Lisinopril    Social History:  The patient  reports that he quit smoking about 21 years ago. His smoking use included cigarettes. He started smoking about 36 years ago. He has a 15 pack-year smoking history. He has never used smokeless tobacco. He reports current alcohol use. He reports that he does not use drugs.   Family History:  The patient's family history includes CAD in his brother; COPD in his father, mother, and sister; Cancer in his father; Cirrhosis in his daughter; Depression in his daughter; Heart attack in his mother; Heart disease in his father and mother; Hypertension in his father; Suicidality in his brother.    ROS:  Please see the history of present illness.   Otherwise, review of systems are positive for none.   All other systems are reviewed and negative.    PHYSICAL EXAM: VS:  BP 124/70 (BP Location: Left Arm, Patient Position: Sitting, Cuff Size: Normal)   Pulse 68   Ht 5' 8 (1.727 m)   Wt 218 lb 4 oz (99 kg)   SpO2 98%   BMI 33.18 kg/m  , BMI Body mass index is 33.18 kg/m. GEN: Well nourished, well developed, in no acute  distress  HEENT: normal  Neck: no JVD, or masses.  Right carotid bruit Cardiac: RRR; no murmurs, rubs, or gallops,no edema  Respiratory:  clear to auscultation bilaterally, normal work of breathing GI: soft, nontender, nondistended, + BS MS: no deformity or atrophy  Skin: warm and dry, no rash Neuro:  Strength and sensation are intact Psych: euthymic mood, full affect   EKG:  EKG is ordered today. The ekg ordered today demonstrates : Normal sinus rhythm Right bundle branch block Left anterior fascicular block Bifascicular block Minimal voltage criteria for LVH, may be normal variant ( R in aVL )    Recent Labs: 09/06/2023: BUN 20;  Creatinine, Ser 0.88; Hemoglobin 13.0; Platelets 189; Potassium 4.4; Sodium 138 03/05/2024: ALT 17    Lipid Panel    Component Value Date/Time   CHOL 98 (L) 03/05/2024 0836   TRIG 54 03/05/2024 0836   TRIG 108 02/27/2013 1609   HDL 44 03/05/2024 0836   HDL 42 02/27/2013 1609   CHOLHDL 2.2 03/05/2024 0836   CHOLHDL 3.1 09/06/2023 0755   VLDL 13 09/06/2023 0755   LDLCALC 41 03/05/2024 0836   LDLCALC 60 06/18/2020 0834   LDLCALC 76 02/27/2013 1609      Wt Readings from Last 3 Encounters:  07/13/24 218 lb 4 oz (99 kg)  05/01/24 208 lb (94.3 kg)  03/23/24 208 lb (94.3 kg)        ASSESSMENT AND PLAN:   1.  Pulsatile tinnitus with faint right carotid bruit: I requested carotid Doppler for evaluation.  I advised him to follow-up with ENT as well.  2.  Preop cardiovascular evaluation for lumbar spine surgery: He has no anginal symptoms and his EKG is unchanged.  He can proceed with an overall low risk.  Aspirin  can be held 7 days before.  3.  Coronary artery disease involving native coronary arteries without angina: Known moderate nonobstructive coronary artery disease on previous CTA.  Currently with no anginal symptoms.  Continue treatment of risk factors and low-dose aspirin  81 mg daily.  4.  Palpitations: Short runs of SVT noted on  previous monitor.  Symptoms are well controlled on small dose carvedilol .  5.  Hyperlipidemia: He is doing well with atorvastatin  and ezetimibe .  Most recent lipid profile showed an LDL of 41.   6. Essential hypertension: Blood pressure is well-controlled on current medications.     Disposition:   FU with me in 6 months  Signed,  [name], MD  07/13/2024 10:38 AM    [facility]

## 2024-07-13 NOTE — Patient Instructions (Signed)
 Medication Instructions:  Your physician recommends that you continue on your current medications as directed. Please refer to the Current Medication list given to you today.    Ok to hold Aspirin  7 days prior to procedure  *If you need a refill on your cardiac medications before your next appointment, please call your pharmacy*  Lab Work: No labs ordered today    Testing/Procedures: Your physician has requested that you have a carotid duplex. This test is an ultrasound of the carotid arteries in your neck. It looks at blood flow through these arteries that supply the brain with blood.   Allow one hour for this exam.  There are no restrictions or special instructions.  This will take place at [address] [facility], [place] [address]  Please note: We ask at that you not bring children with you during ultrasound (echo/ vascular) testing. Due to room size and safety concerns, children are not allowed in the ultrasound rooms during exams. Our front office staff cannot provide observation of children in our lobby area while testing is being conducted. An adult accompanying a patient to their appointment will only be allowed in the ultrasound room at the discretion of the ultrasound technician under special circumstances. We apologize for any inconvenience.   Follow-Up: At [facility], you and your health needs are our priority.  As part of our continuing mission to provide you with exceptional heart care, our providers are all part of one team.  This team includes your primary Cardiologist (physician) and Advanced Practice Providers or APPs (Physician Assistants and Nurse Practitioners) who all work together to provide you with the care you need, when you need it.  Your next appointment:   6 month(s)  Provider:   You may see Dr. Darron or one of the following Advanced Practice Providers on your designated Care Team:   Lonni Meager, NP Lesley Maffucci,  PA-C Bernardino Bring, PA-C Cadence Chanhassen, PA-C Tylene Lunch, NP Barnie Hila, NP

## 2024-07-13 NOTE — Telephone Encounter (Signed)
 See office note from today

## 2024-08-02 ENCOUNTER — Other Ambulatory Visit (HOSPITAL_COMMUNITY): Payer: Self-pay

## 2024-08-15 ENCOUNTER — Ambulatory Visit

## 2024-08-15 DIAGNOSIS — R0989 Other specified symptoms and signs involving the circulatory and respiratory systems: Secondary | ICD-10-CM | POA: Insufficient documentation

## 2024-08-17 ENCOUNTER — Ambulatory Visit: Payer: Self-pay | Admitting: Cardiovascular Disease

## 2024-08-20 NOTE — Telephone Encounter (Signed)
-----   Message from [name], MD sent at 08/17/2024 12:24 PM EST ----- Inform patient that carotid Doppler showed mild nonobstructive disease bilaterally.  There is likely stenosis in the artery supplying his right arm.  Have him follow-up with me in 1 month to  reevaluate his symptoms and see if there is a need for angiography.

## 2024-08-20 NOTE — Telephone Encounter (Signed)
 Patient made aware of results and verbalized understanding.  He is scheduled to have back surgery on 08/31/24 at [facility]. He would like to know if Dr. Darron feels like this should be okay considering his latest results.   He has a follow up appointment with Dr. Darron on 09/18/24.

## 2024-08-24 NOTE — Telephone Encounter (Signed)
 Patient has been made aware and verbalized his understanding.

## 2024-08-27 NOTE — Pre-Procedure Instructions (Addendum)
 Surgical Instructions   Your procedure is scheduled on September 01, 2023. Report to [facility] Main Entrance A at 8:55 A.M., then check in with the Admitting office. Any questions or running late day of surgery: call [phone]  Questions prior to your surgery date: call [phone], Monday-Friday, 8am-4pm. If you experience any cold or flu symptoms such as cough, fever, chills, shortness of breath, etc. between now and your scheduled surgery, please notify us  at the above number.     Remember:  Do not eat after midnight the night before your surgery  You may drink clear liquids until 7:55AM the morning of your surgery.   Clear liquids allowed are: Water, Non-Citrus Juices (without pulp), Carbonated Beverages, Clear Tea (no milk, honey, etc.), Black Coffee Only (NO MILK, CREAM OR POWDERED CREAMER of any kind), and Gatorade.    Take these medicines the morning of surgery with A SIP OF WATER: atorvastatin  (LIPITOR)  carvedilol  (COREG )   May take these medicines IF NEEDED: acetaminophen  (TYLENOL )  methocarbamol (ROBAXIN)  Follow your surgeon's instructions on stopping Aspirin . If no instructions were given, please contact your surgeon's office. Per your cardiologist- you can hold Aspirin  for 7 days prior to surgery. Last dose 08/23/24.   One week prior to surgery, STOP taking any  Aleve , Naproxen , Ibuprofen, Motrin, Advil, Goody's, BC's, all herbal medications, fish oil, and non-prescription vitamins.                     Do NOT Smoke (Tobacco/Vaping) for 24 hours prior to your procedure.  If you use a CPAP at night, you may bring your mask/headgear for your overnight stay.   You will be asked to remove any contacts, glasses, piercing's, hearing aid's, dentures/partials prior to surgery. Please bring cases for these items if needed.    Patients discharged the day of surgery will not be allowed to drive home, and someone needs to stay with them for 24 hours.  SURGICAL WAITING ROOM  VISITATION Patients may have no more than 2 support people in the waiting area - these visitors may rotate.   Pre-op nurse will coordinate an appropriate time for 1 ADULT support person, who may not rotate, to accompany patient in pre-op.  Children under the age of 55 must have an adult with them who is not the patient and must remain in the main waiting area with an adult.  If the patient needs to stay at the hospital during part of their recovery, the visitor guidelines for inpatient rooms apply.  Please refer to the [facility] website for the visitor guidelines for any additional information.   If you received a COVID test during your pre-op visit  it is requested that you wear a mask when out in public, stay away from anyone that may not be feeling well and notify your surgeon if you develop symptoms. If you have been in contact with anyone that has tested positive in the last 10 days please notify you surgeon.      Pre-operative 4 CHG Bathing Instructions   You can play a key role in reducing the risk of infection after surgery. Your skin needs to be as free of germs as possible. You can reduce the number of germs on your skin by washing with CHG (chlorhexidine  gluconate) soap before surgery. CHG is an antiseptic soap that kills germs and continues to kill germs even after washing.   DO NOT use if you have an allergy to chlorhexidine /CHG or antibacterial soaps.  If your skin becomes reddened or irritated, stop using the CHG and notify one of our RNs at [phone].   Please shower with the CHG soap starting 4 days before surgery using the following schedule:     Please keep in mind the following:  DO NOT shave, including legs and underarms, starting the day of your first shower.   You may shave your face at any point before/day of surgery.  Place clean sheets on your bed the day you start using CHG soap. Use a clean washcloth (not used since being washed) for each shower. DO NOT  sleep with pets once you start using the CHG.   CHG Shower Instructions:  Wash your face and private area with normal soap. If you choose to wash your hair, wash first with your normal shampoo.  After you use shampoo/soap, rinse your hair and body thoroughly to remove shampoo/soap residue.  Turn the water OFF and apply  bottle of CHG soap to a CLEAN washcloth.  Apply CHG soap ONLY FROM YOUR NECK DOWN TO YOUR TOES (washing for 3-5 minutes)  DO NOT use CHG soap on face, private areas, open wounds, or sores.  Pay special attention to the area where your surgery is being performed.  If you are having back surgery, having someone wash your back for you may be helpful. Wait 2 minutes after CHG soap is applied, then you may rinse off the CHG soap.  Pat dry with a clean towel  Put on clean clothes/pajamas   If you choose to wear lotion, please use ONLY the CHG-compatible lotions that are listed below.  Additional instructions for the day of surgery:  If you choose, you may shower the morning of surgery with an antibacterial soap.  DO NOT APPLY any lotions, deodorants, cologne, or perfumes.   Do not bring valuables to the hospital. [facility] is not responsible for any belongings/valuables. Do not wear nail polish, gel polish, artificial nails, or any other type of covering on natural nails (fingers and toes) Do not wear jewelry or makeup Put on clean/comfortable clothes.  Please brush your teeth.  Ask your nurse before applying any prescription medications to the skin.     CHG Compatible Lotions   Aveeno Moisturizing lotion  Cetaphil Moisturizing Cream  Cetaphil Moisturizing Lotion  Clairol Herbal Essence Moisturizing Lotion, Dry Skin  Clairol Herbal Essence Moisturizing Lotion, Extra Dry Skin  Clairol Herbal Essence Moisturizing Lotion, Normal Skin  Curel Age Defying Therapeutic Moisturizing Lotion with Alpha Hydroxy  Curel Extreme Care Body Lotion  Curel Soothing Hands Moisturizing  Hand Lotion  Curel Therapeutic Moisturizing Cream, Fragrance-Free  Curel Therapeutic Moisturizing Lotion, Fragrance-Free  Curel Therapeutic Moisturizing Lotion, Original Formula  Eucerin Daily Replenishing Lotion  Eucerin Dry Skin Therapy Plus Alpha Hydroxy Crme  Eucerin Dry Skin Therapy Plus Alpha Hydroxy Lotion  Eucerin Original Crme  Eucerin Original Lotion  Eucerin Plus Crme Eucerin Plus Lotion  Eucerin TriLipid Replenishing Lotion  Keri Anti-Bacterial Hand Lotion  Keri Deep Conditioning Original Lotion Dry Skin Formula Softly Scented  Keri Deep Conditioning Original Lotion, Fragrance Free Sensitive Skin Formula  Keri Lotion Fast Absorbing Fragrance Free Sensitive Skin Formula  Keri Lotion Fast Absorbing Softly Scented Dry Skin Formula  Keri Original Lotion  Keri Skin Renewal Lotion Keri Silky Smooth Lotion  Keri Silky Smooth Sensitive Skin Lotion  Nivea Body Creamy Conditioning Oil  Nivea Body Extra Enriched Teacher, Adult Education Moisturizing Lotion Nivea Crme  Nivea  Skin Firming Lotion  NutraDerm 30 Skin Lotion  NutraDerm Skin Lotion  NutraDerm Therapeutic Skin Cream  NutraDerm Therapeutic Skin Lotion  ProShield Protective Hand Cream  Provon moisturizing lotion  Please read over the following fact sheets that you were given.

## 2024-08-28 ENCOUNTER — Encounter (HOSPITAL_COMMUNITY): Payer: Self-pay

## 2024-08-28 ENCOUNTER — Other Ambulatory Visit: Payer: Self-pay

## 2024-08-28 ENCOUNTER — Encounter (HOSPITAL_COMMUNITY)
Admission: RE | Admit: 2024-08-28 | Discharge: 2024-08-28 | Disposition: A | Discharge status: Home / Self Care | Source: Ambulatory Visit | Attending: Neurological Surgery | Admitting: Neurological Surgery

## 2024-08-28 VITALS — BP 159/87 | HR 62 | Temp 97.9°F | Resp 18 | Ht 68.0 in | Wt 219.1 lb

## 2024-08-28 DIAGNOSIS — I080 Rheumatic disorders of both mitral and aortic valves: Secondary | ICD-10-CM | POA: Insufficient documentation

## 2024-08-28 DIAGNOSIS — Z6833 Body mass index (BMI) 33.0-33.9, adult: Secondary | ICD-10-CM | POA: Diagnosis not present

## 2024-08-28 DIAGNOSIS — I471 Supraventricular tachycardia, unspecified: Secondary | ICD-10-CM | POA: Diagnosis not present

## 2024-08-28 DIAGNOSIS — E871 Hypo-osmolality and hyponatremia: Secondary | ICD-10-CM | POA: Diagnosis not present

## 2024-08-28 DIAGNOSIS — Z01812 Encounter for preprocedural laboratory examination: Secondary | ICD-10-CM | POA: Insufficient documentation

## 2024-08-28 DIAGNOSIS — I10 Essential (primary) hypertension: Secondary | ICD-10-CM | POA: Diagnosis not present

## 2024-08-28 DIAGNOSIS — Z01818 Encounter for other preprocedural examination: Secondary | ICD-10-CM

## 2024-08-28 DIAGNOSIS — E669 Obesity, unspecified: Secondary | ICD-10-CM | POA: Insufficient documentation

## 2024-08-28 DIAGNOSIS — Z87891 Personal history of nicotine dependence: Secondary | ICD-10-CM | POA: Diagnosis not present

## 2024-08-28 DIAGNOSIS — I452 Bifascicular block: Secondary | ICD-10-CM | POA: Diagnosis not present

## 2024-08-28 DIAGNOSIS — I2510 Atherosclerotic heart disease of native coronary artery without angina pectoris: Secondary | ICD-10-CM | POA: Diagnosis not present

## 2024-08-28 LAB — CBC
HCT: 41.3 % (ref 39.0–52.0)
Hemoglobin: 13.9 g/dL (ref 13.0–17.0)
MCH: 30.7 pg (ref 26.0–34.0)
MCHC: 33.7 g/dL (ref 30.0–36.0)
MCV: 91.2 fL (ref 80.0–100.0)
Platelets: 216 K/uL (ref 150–400)
RBC: 4.53 MIL/uL (ref 4.22–5.81)
RDW: 13.2 % (ref 11.5–15.5)
WBC: 5.7 K/uL (ref 4.0–10.5)
nRBC: 0 % (ref 0.0–0.2)

## 2024-08-28 LAB — BASIC METABOLIC PANEL WITH GFR
Anion gap: 7 (ref 5–15)
BUN: 16 mg/dL (ref 8–23)
CO2: 26 mmol/L (ref 22–32)
Calcium: 8.6 mg/dL — ABNORMAL LOW (ref 8.9–10.3)
Chloride: 100 mmol/L (ref 98–111)
Creatinine, Ser: 0.92 mg/dL (ref 0.61–1.24)
GFR, Estimated: >60 mL/min
Glucose, Bld: 90 mg/dL (ref 70–99)
Potassium: 4.4 mmol/L (ref 3.5–5.1)
Sodium: 133 mmol/L — ABNORMAL LOW (ref 135–145)

## 2024-08-28 LAB — SURGICAL PCR SCREEN
MRSA, PCR: NEGATIVE
Staphylococcus aureus: NEGATIVE

## 2024-08-28 NOTE — Pre-Procedure Instructions (Signed)
 Surgical Instructions   Your procedure is scheduled on September 01, 2023. Report to [facility] Main Entrance A at 8:00 A.M., then check in with the Admitting office. Any questions or running late day of surgery: call [phone]  Questions prior to your surgery date: call [phone], Monday-Friday, 8am-4pm. If you experience any cold or flu symptoms such as cough, fever, chills, shortness of breath, etc. between now and your scheduled surgery, please notify us  at the above number.     Remember:  Do not eat after midnight the night before your surgery  You may drink clear liquids until 7:00AM the morning of your surgery.   Clear liquids allowed are: Water, Non-Citrus Juices (without pulp), Carbonated Beverages, Clear Tea (no milk, honey, etc.), Black Coffee Only (NO MILK, CREAM OR POWDERED CREAMER of any kind), and Gatorade.    Take these medicines the morning of surgery with A SIP OF WATER: atorvastatin  (LIPITOR)  carvedilol  (COREG )   May take these medicines IF NEEDED: acetaminophen  (TYLENOL )  methocarbamol (ROBAXIN)  Follow your surgeon's instructions on stopping Aspirin . If no instructions were given, please contact your surgeon's office. Per your cardiologist- you can hold Aspirin  for 7 days prior to surgery. Last dose 08/23/24.   One week prior to surgery, STOP taking any  Aleve , Naproxen , Ibuprofen, Motrin, Advil, Goody's, BC's, all herbal medications, fish oil, and non-prescription vitamins.                     Do NOT Smoke (Tobacco/Vaping) for 24 hours prior to your procedure.  If you use a CPAP at night, you may bring your mask/headgear for your overnight stay.   You will be asked to remove any contacts, glasses, piercing's, hearing aid's, dentures/partials prior to surgery. Please bring cases for these items if needed.    Patients discharged the day of surgery will not be allowed to drive home, and someone needs to stay with them for 24 hours.  SURGICAL WAITING ROOM  VISITATION Patients may have no more than 2 support people in the waiting area - these visitors may rotate.   Pre-op nurse will coordinate an appropriate time for 1 ADULT support person, who may not rotate, to accompany patient in pre-op.  Children under the age of 24 must have an adult with them who is not the patient and must remain in the main waiting area with an adult.  If the patient needs to stay at the hospital during part of their recovery, the visitor guidelines for inpatient rooms apply.  Please refer to the [facility] website for the visitor guidelines for any additional information.   If you received a COVID test during your pre-op visit  it is requested that you wear a mask when out in public, stay away from anyone that may not be feeling well and notify your surgeon if you develop symptoms. If you have been in contact with anyone that has tested positive in the last 10 days please notify you surgeon.      Pre-operative 4 CHG Bathing Instructions   You can play a key role in reducing the risk of infection after surgery. Your skin needs to be as free of germs as possible. You can reduce the number of germs on your skin by washing with CHG (chlorhexidine  gluconate) soap before surgery. CHG is an antiseptic soap that kills germs and continues to kill germs even after washing.   DO NOT use if you have an allergy to chlorhexidine /CHG or antibacterial soaps.  If your skin becomes reddened or irritated, stop using the CHG and notify one of our RNs at [phone].   Please shower with the CHG soap starting 4 days before surgery using the following schedule:     Please keep in mind the following:  DO NOT shave, including legs and underarms, starting the day of your first shower.   You may shave your face at any point before/day of surgery.  Place clean sheets on your bed the day you start using CHG soap. Use a clean washcloth (not used since being washed) for each shower. DO NOT  sleep with pets once you start using the CHG.   CHG Shower Instructions:  Wash your face and private area with normal soap. If you choose to wash your hair, wash first with your normal shampoo.  After you use shampoo/soap, rinse your hair and body thoroughly to remove shampoo/soap residue.  Turn the water OFF and apply  bottle of CHG soap to a CLEAN washcloth.  Apply CHG soap ONLY FROM YOUR NECK DOWN TO YOUR TOES (washing for 3-5 minutes)  DO NOT use CHG soap on face, private areas, open wounds, or sores.  Pay special attention to the area where your surgery is being performed.  If you are having back surgery, having someone wash your back for you may be helpful. Wait 2 minutes after CHG soap is applied, then you may rinse off the CHG soap.  Pat dry with a clean towel  Put on clean clothes/pajamas   If you choose to wear lotion, please use ONLY the CHG-compatible lotions that are listed below.  Additional instructions for the day of surgery:  If you choose, you may shower the morning of surgery with an antibacterial soap.  DO NOT APPLY any lotions, deodorants, cologne, or perfumes.   Do not bring valuables to the hospital. [facility] is not responsible for any belongings/valuables. Do not wear nail polish, gel polish, artificial nails, or any other type of covering on natural nails (fingers and toes) Do not wear jewelry or makeup Put on clean/comfortable clothes.  Please brush your teeth.  Ask your nurse before applying any prescription medications to the skin.     CHG Compatible Lotions   Aveeno Moisturizing lotion  Cetaphil Moisturizing Cream  Cetaphil Moisturizing Lotion  Clairol Herbal Essence Moisturizing Lotion, Dry Skin  Clairol Herbal Essence Moisturizing Lotion, Extra Dry Skin  Clairol Herbal Essence Moisturizing Lotion, Normal Skin  Curel Age Defying Therapeutic Moisturizing Lotion with Alpha Hydroxy  Curel Extreme Care Body Lotion  Curel Soothing Hands Moisturizing  Hand Lotion  Curel Therapeutic Moisturizing Cream, Fragrance-Free  Curel Therapeutic Moisturizing Lotion, Fragrance-Free  Curel Therapeutic Moisturizing Lotion, Original Formula  Eucerin Daily Replenishing Lotion  Eucerin Dry Skin Therapy Plus Alpha Hydroxy Crme  Eucerin Dry Skin Therapy Plus Alpha Hydroxy Lotion  Eucerin Original Crme  Eucerin Original Lotion  Eucerin Plus Crme Eucerin Plus Lotion  Eucerin TriLipid Replenishing Lotion  Keri Anti-Bacterial Hand Lotion  Keri Deep Conditioning Original Lotion Dry Skin Formula Softly Scented  Keri Deep Conditioning Original Lotion, Fragrance Free Sensitive Skin Formula  Keri Lotion Fast Absorbing Fragrance Free Sensitive Skin Formula  Keri Lotion Fast Absorbing Softly Scented Dry Skin Formula  Keri Original Lotion  Keri Skin Renewal Lotion Keri Silky Smooth Lotion  Keri Silky Smooth Sensitive Skin Lotion  Nivea Body Creamy Conditioning Oil  Nivea Body Extra Enriched Teacher, Adult Education Moisturizing Lotion Nivea Crme  Nivea  Skin Firming Lotion  NutraDerm 30 Skin Lotion  NutraDerm Skin Lotion  NutraDerm Therapeutic Skin Cream  NutraDerm Therapeutic Skin Lotion  ProShield Protective Hand Cream  Provon moisturizing lotion  Please read over the following fact sheets that you were given.

## 2024-08-28 NOTE — Progress Notes (Signed)
 PCP - Dorette Glenard COME Cardiologist - Deatrice Arida,MD  PPM/ICD - denies Device Orders -  Rep Notified -   Chest x-ray - NA EKG - 07/13/24 Stress Test - 03/28/20 ECHO - 04/23/20 Cardiac Cath - denies  Sleep Study - denies CPAP - no  Fasting Blood Sugar - na Checks Blood Sugar _____ times a day  Last dose of GLP1 agonist-  na GLP1 instructions:   Blood Thinner Instructions:na Aspirin  Instructions:Per your cardiologist- you can hold Aspirin  for 7 days prior to surgery. Last dose 08/23/24.   ERAS Protcol -clear liquids until 0700 PRE-SURGERY Ensure or G2- no  COVID TEST- na   Anesthesia review: yes - Cardiac clearance 07/13/24  Patient denies shortness of breath, fever, cough and chest pain at PAT appointment   All instructions explained to the patient, with a verbal understanding of the material. Patient agrees to go over the instructions while at home for a better understanding. The opportunity to ask questions was provided.

## 2024-08-29 NOTE — Progress Notes (Signed)
 Anesthesia Chart Review:  69 year old male follows with cardiology for history of nonobstructive CAD, HTN, HLD, palpitations. ZIO monitor in 2021 showed 1 episode of wide-complex tachycardia lasting only 4 beats and was likely due to SVT with aberrancy although slow nonsustained ventricular tachycardia could not be excluded. He was also noted to have short runs of SVT the longest lasted 16 beats. Echocardiogram in August 2021 showed normal LV systolic function with mild mitral regurgitation and no significant pulmonary hypertension. Cardiac CTA in September 2021 showed a calcium  score of 857 with evidence of moderate stenosis in the proximal LAD as well as mild left circumflex disease. None of the lesions were significant by FFR.  Last seen in follow-up by Dr. DELENA on 07/13/2024 for preop evaluation.  Per note, Preop cardiovascular evaluation for lumbar spine surgery: He has no anginal symptoms and his EKG is unchanged. He can proceed with an overall low risk. Aspirin  can be held 7 days before.  Carotid Doppler was also ordered at that time which was done 08/15/2024 and showed mild nonobstructive disease bilaterally.  Other pertinent history includes former smoker (quit 2004), obesity BMI 33.  Preop labs reviewed, mild hyponatremia sodium 133, otherwise unremarkable.  EKG 07/13/2024: NSR.  Rate 68.  Right bundle branch block.  Left anterior fascicular block.  CCTA with FFR analysis 05/01/2020: 1. Left Main:  No significant stenosis.   2. LAD: No significant stenosis.  FFR 0.9 proximally, 0.84 distally 3. LCX: No significant stenosis.  FFR 0.91 4. RCA: No significant stenosis.  FFR 0.9   IMPRESSION: 1.  CT FFR analysis didn't show any significant stenosis.  TTE 04/21/2020: 1. Left ventricular ejection fraction, by estimation, is 55 to 60%. The  left ventricle has normal function. The left ventricle has no regional  wall motion abnormalities. Left ventricular diastolic parameters were  normal. The  average left ventricular  global longitudinal strain is -15.9 %.   2. Right ventricular systolic function is normal. The right ventricular  size is normal. There is normal pulmonary artery systolic pressure.   3. The mitral valve is normal in structure. Mild mitral valve  regurgitation. No evidence of mitral stenosis.   4. The aortic valve is tricuspid. Aortic valve regurgitation is not  visualized. Mild aortic valve sclerosis is present, with no evidence of  aortic valve stenosis.   5. The inferior vena cava is normal in size with greater than 50%  respiratory variability, suggesting right atrial pressure of 3 mmHg.   Event monitor 03/2020: Normal sinus rhythm with an average heart rate of 71 bpm. 1 run of wide-complex tachycardia lasting only 4 beats likely SVT with aberrancy although slow nonsustained ventricular tachycardia cannot be excluded. 6 episodes of SVT noted the longest lasted 16 beats. Rare PACs and rare PVCs. Most triggered events did not correlate with arrhythmia.      Lynwood Geofm RIGGERS [facility] Stay [facility]/Anesthesiology Phone [phone] 08/29/2024 12:52 PM

## 2024-08-29 NOTE — Anesthesia Preprocedure Evaluation (Addendum)
 "                                  Anesthesia Evaluation  Patient identified by MRN, date of birth, ID band Patient awake    Reviewed: Allergy & Precautions, NPO status , Patient's Chart, lab work & pertinent test results  Airway Mallampati: I  TM Distance: >3 FB Neck ROM: Full    Dental  (+) Teeth Intact, Dental Advisory Given   Pulmonary former smoker   breath sounds clear to auscultation       Cardiovascular hypertension, Pt. on home beta blockers and Pt. on medications + CAD   Rhythm:Regular Rate:Normal  Echo:   1. Left ventricular ejection fraction, by estimation, is 55 to 60%. The  left ventricle has normal function. The left ventricle has no regional  wall motion abnormalities. Left ventricular diastolic parameters were  normal. The average left ventricular  global longitudinal strain is -15.9 %.   2. Right ventricular systolic function is normal. The right ventricular  size is normal. There is normal pulmonary artery systolic pressure.   3. The mitral valve is normal in structure. Mild mitral valve  regurgitation. No evidence of mitral stenosis.   4. The aortic valve is tricuspid. Aortic valve regurgitation is not  visualized. Mild aortic valve sclerosis is present, with no evidence of  aortic valve stenosis.   5. The inferior vena cava is normal in size with greater than 50%  respiratory variability, suggesting right atrial pressure of 3 mmHg.     Neuro/Psych negative neurological ROS  negative psych ROS   GI/Hepatic negative GI ROS, Neg liver ROS,,,  Endo/Other  negative endocrine ROS    Renal/GU negative Renal ROS     Musculoskeletal  (+) Arthritis ,    Abdominal   Peds  Hematology negative hematology ROS (+)   Anesthesia Other Findings   Reproductive/Obstetrics                              Anesthesia Physical Anesthesia Plan  ASA: 2  Anesthesia Plan: General   Post-op Pain Management: Ofirmev  IV  (intra-op)*   Induction: Intravenous  PONV Risk Score and Plan: 3 and Ondansetron , Dexamethasone  and Midazolam   Airway Management Planned: Oral ETT  Additional Equipment: None  Intra-op Plan:   Post-operative Plan: Extubation in OR  Informed Consent: I have reviewed the patients History and Physical, chart, labs and discussed the procedure including the risks, benefits and alternatives for the proposed anesthesia with the patient or authorized representative who has indicated his/her understanding and acceptance.     Dental advisory given  Plan Discussed with: CRNA  Anesthesia Plan Comments: (PAT note by Lynwood Hope, PA-C:  69 year old male follows with cardiology for history of nonobstructive CAD, HTN, HLD, palpitations. ZIO monitor in 2021 showed 1 episode of wide-complex tachycardia lasting only 4 beats and was likely due to SVT with aberrancy although slow nonsustained ventricular tachycardia could not be excluded. He was also noted to have short runs of SVT the longest lasted 16 beats. Echocardiogram in August 2021 showed normal LV systolic function with mild mitral regurgitation and no significant pulmonary hypertension. Cardiac CTA in September 2021 showed a calcium  score of 857 with evidence of moderate stenosis in the proximal LAD as well as mild left circumflex disease. None of the lesions were significant by FFR.  Last seen in follow-up  by Dr. DELENA on 07/13/2024 for preop evaluation.  Per note, Preop cardiovascular evaluation for lumbar spine surgery: He has no anginal symptoms and his EKG is unchanged. He can proceed with an overall low risk. Aspirin  can be held 7 days before.  Carotid Doppler was also ordered at that time which was done 08/15/2024 and showed mild nonobstructive disease bilaterally.  Other pertinent history includes former smoker (quit 2004), obesity BMI 33.  Preop labs reviewed, mild hyponatremia sodium 133, otherwise unremarkable.  EKG 07/13/2024: NSR.   Rate 68.  Right bundle branch block.  Left anterior fascicular block.  CCTA with FFR analysis 05/01/2020: 1. Left Main:  No significant stenosis.  2. LAD: No significant stenosis.  FFR 0.9 proximally, 0.84 distally 3. LCX: No significant stenosis.  FFR 0.91 4. RCA: No significant stenosis.  FFR 0.9  IMPRESSION: 1.  CT FFR analysis didn't show any significant stenosis.  TTE 04/21/2020: 1. Left ventricular ejection fraction, by estimation, is 55 to 60%. The  left ventricle has normal function. The left ventricle has no regional  wall motion abnormalities. Left ventricular diastolic parameters were  normal. The average left ventricular  global longitudinal strain is -15.9 %.  2. Right ventricular systolic function is normal. The right ventricular  size is normal. There is normal pulmonary artery systolic pressure.  3. The mitral valve is normal in structure. Mild mitral valve  regurgitation. No evidence of mitral stenosis.  4. The aortic valve is tricuspid. Aortic valve regurgitation is not  visualized. Mild aortic valve sclerosis is present, with no evidence of  aortic valve stenosis.  5. The inferior vena cava is normal in size with greater than 50%  respiratory variability, suggesting right atrial pressure of 3 mmHg.   Event monitor 03/2020: Normal sinus rhythm with an average heart rate of 71 bpm. 1 run of wide-complex tachycardia lasting only 4 beats likely SVT with aberrancy although slow nonsustained ventricular tachycardia cannot be excluded. 6 episodes of SVT noted the longest lasted 16 beats. Rare PACs and rare PVCs. Most triggered events did not correlate with arrhythmia.    )         Anesthesia Quick Evaluation  "

## 2024-08-31 ENCOUNTER — Observation Stay (HOSPITAL_COMMUNITY)
Admission: RE | Admit: 2024-08-31 | Discharge: 2024-09-01 | Disposition: A | Attending: Neurological Surgery | Admitting: Neurological Surgery

## 2024-08-31 ENCOUNTER — Other Ambulatory Visit: Payer: Self-pay

## 2024-08-31 ENCOUNTER — Inpatient Hospital Stay (HOSPITAL_COMMUNITY)

## 2024-08-31 ENCOUNTER — Inpatient Hospital Stay (HOSPITAL_COMMUNITY): Payer: Self-pay | Admitting: Physician Assistant

## 2024-08-31 ENCOUNTER — Encounter (HOSPITAL_COMMUNITY): Payer: Self-pay | Admitting: Neurological Surgery

## 2024-08-31 ENCOUNTER — Encounter (HOSPITAL_COMMUNITY): Admission: RE | Disposition: A | Payer: Self-pay | Source: Home / Self Care | Attending: Neurological Surgery

## 2024-08-31 DIAGNOSIS — Z85828 Personal history of other malignant neoplasm of skin: Secondary | ICD-10-CM | POA: Insufficient documentation

## 2024-08-31 DIAGNOSIS — I10 Essential (primary) hypertension: Secondary | ICD-10-CM | POA: Diagnosis not present

## 2024-08-31 DIAGNOSIS — M48061 Spinal stenosis, lumbar region without neurogenic claudication: Secondary | ICD-10-CM | POA: Diagnosis not present

## 2024-08-31 DIAGNOSIS — M5416 Radiculopathy, lumbar region: Secondary | ICD-10-CM | POA: Insufficient documentation

## 2024-08-31 DIAGNOSIS — Y792 Prosthetic and other implants, materials and accessory orthopedic devices associated with adverse incidents: Secondary | ICD-10-CM | POA: Diagnosis not present

## 2024-08-31 DIAGNOSIS — T8484XA Pain due to internal orthopedic prosthetic devices, implants and grafts, initial encounter: Secondary | ICD-10-CM | POA: Insufficient documentation

## 2024-08-31 DIAGNOSIS — I2510 Atherosclerotic heart disease of native coronary artery without angina pectoris: Secondary | ICD-10-CM | POA: Insufficient documentation

## 2024-08-31 DIAGNOSIS — Z9889 Other specified postprocedural states: Principal | ICD-10-CM

## 2024-08-31 DIAGNOSIS — M5440 Lumbago with sciatica, unspecified side: Secondary | ICD-10-CM | POA: Diagnosis present

## 2024-08-31 DIAGNOSIS — T84296A Other mechanical complication of internal fixation device of vertebrae, initial encounter: Principal | ICD-10-CM | POA: Insufficient documentation

## 2024-08-31 DIAGNOSIS — Z87891 Personal history of nicotine dependence: Secondary | ICD-10-CM | POA: Insufficient documentation

## 2024-08-31 HISTORY — PX: LAMINECTOMY WITH POSTERIOR LATERAL ARTHRODESIS LEVEL 2: SHX6336

## 2024-08-31 SURGERY — LAMINECTOMY WITH POSTERIOR LATERAL ARTHRODESIS LEVEL 2
Anesthesia: General | Site: Back | Laterality: Bilateral

## 2024-08-31 MED ORDER — FENTANYL CITRATE (PF) 100 MCG/2ML IJ SOLN
25.0000 ug | INTRAMUSCULAR | Status: DC | PRN
  Administered 2024-08-31 (×2): 25 ug via INTRAVENOUS

## 2024-08-31 MED ORDER — MAGNESIUM OXIDE -MG SUPPLEMENT 400 (240 MG) MG PO TABS
400.0000 mg | ORAL_TABLET | Freq: Every day | ORAL | Status: DC
  Administered 2024-08-31: 400 mg via ORAL
  Filled 2024-08-31: qty 1

## 2024-08-31 MED ORDER — CARVEDILOL 3.125 MG PO TABS
3.1250 mg | ORAL_TABLET | Freq: Two times a day (BID) | ORAL | Status: DC
  Administered 2024-08-31 – 2024-09-01 (×2): 3.125 mg via ORAL
  Filled 2024-08-31 (×2): qty 1

## 2024-08-31 MED ORDER — MORPHINE SULFATE (PF) 2 MG/ML IV SOLN: 2.0000 mg | INTRAVENOUS | Status: DC | PRN

## 2024-08-31 MED ORDER — METHOCARBAMOL 500 MG PO TABS
500.0000 mg | ORAL_TABLET | Freq: Four times a day (QID) | ORAL | Status: DC | PRN
  Administered 2024-08-31 (×2): 500 mg via ORAL
  Filled 2024-08-31 (×2): qty 1

## 2024-08-31 MED ORDER — THROMBIN 20000 UNITS EX SOLR
CUTANEOUS | Status: AC
  Filled 2024-08-31: qty 20000

## 2024-08-31 MED ORDER — PROPOFOL 10 MG/ML IV BOLUS
INTRAVENOUS | Status: DC | PRN
  Administered 2024-08-31: 140 mg via INTRAVENOUS

## 2024-08-31 MED ORDER — BUPIVACAINE HCL (PF) 0.25 % IJ SOLN
INTRAMUSCULAR | Status: DC | PRN
  Administered 2024-08-31: 10 mL

## 2024-08-31 MED ORDER — CEFAZOLIN SODIUM-DEXTROSE 2-4 GM/100ML-% IV SOLN
INTRAVENOUS | Status: AC
  Filled 2024-08-31: qty 100

## 2024-08-31 MED ORDER — SUGAMMADEX SODIUM 200 MG/2ML IV SOLN
INTRAVENOUS | Status: DC | PRN
  Administered 2024-08-31: 200 mg via INTRAVENOUS

## 2024-08-31 MED ORDER — ONDANSETRON HCL 4 MG/2ML IJ SOLN: 4.0000 mg | Freq: Four times a day (QID) | INTRAMUSCULAR | Status: DC | PRN

## 2024-08-31 MED ORDER — ROCURONIUM BROMIDE 10 MG/ML (PF) SYRINGE
PREFILLED_SYRINGE | INTRAVENOUS | Status: AC
  Filled 2024-08-31: qty 10

## 2024-08-31 MED ORDER — THROMBIN 5000 UNITS EX SOLR
OROMUCOSAL | Status: DC | PRN
  Administered 2024-08-31: 5 mL via TOPICAL

## 2024-08-31 MED ORDER — DEXAMETHASONE SOD PHOSPHATE PF 10 MG/ML IJ SOLN
INTRAMUSCULAR | Status: AC
  Filled 2024-08-31: qty 1

## 2024-08-31 MED ORDER — MIDAZOLAM HCL 2 MG/2ML IJ SOLN
INTRAMUSCULAR | Status: AC
  Filled 2024-08-31: qty 2

## 2024-08-31 MED ORDER — SODIUM CHLORIDE 0.9 % IV SOLN
250.0000 mL | INTRAVENOUS | Status: DC
  Administered 2024-08-31: 250 mL via INTRAVENOUS

## 2024-08-31 MED ORDER — DEXAMETHASONE SOD PHOSPHATE PF 10 MG/ML IJ SOLN
INTRAMUSCULAR | Status: DC | PRN
  Administered 2024-08-31: 10 mg via INTRAVENOUS

## 2024-08-31 MED ORDER — MIDAZOLAM HCL (PF) 2 MG/2ML IJ SOLN
INTRAMUSCULAR | Status: DC | PRN
  Administered 2024-08-31: 2 mg via INTRAVENOUS

## 2024-08-31 MED ORDER — ONDANSETRON HCL 4 MG PO TABS: 4.0000 mg | ORAL_TABLET | Freq: Four times a day (QID) | ORAL | Status: DC | PRN

## 2024-08-31 MED ORDER — MAGNESIUM 200 MG PO TABS: ORAL_TABLET | Freq: Every evening | ORAL | Status: DC

## 2024-08-31 MED ORDER — LIDOCAINE 2% (20 MG/ML) 5 ML SYRINGE
INTRAMUSCULAR | Status: DC | PRN
  Administered 2024-08-31: 50 mg via INTRAVENOUS

## 2024-08-31 MED ORDER — 0.9 % SODIUM CHLORIDE (POUR BTL) OPTIME
TOPICAL | Status: DC | PRN
  Administered 2024-08-31: 1000 mL

## 2024-08-31 MED ORDER — FENTANYL CITRATE (PF) 250 MCG/5ML IJ SOLN
INTRAMUSCULAR | Status: AC
  Filled 2024-08-31: qty 5

## 2024-08-31 MED ORDER — DOCUSATE SODIUM 100 MG PO CAPS
100.0000 mg | ORAL_CAPSULE | Freq: Two times a day (BID) | ORAL | Status: DC | PRN
  Administered 2024-08-31: 100 mg via ORAL
  Filled 2024-08-31: qty 1

## 2024-08-31 MED ORDER — THROMBIN 5000 UNITS EX KIT
PACK | CUTANEOUS | Status: AC
  Filled 2024-08-31: qty 1

## 2024-08-31 MED ORDER — ACETAMINOPHEN 500 MG PO TABS
1000.0000 mg | ORAL_TABLET | Freq: Four times a day (QID) | ORAL | Status: DC
  Administered 2024-08-31 – 2024-09-01 (×3): 1000 mg via ORAL
  Filled 2024-08-31 (×3): qty 2

## 2024-08-31 MED ORDER — LOSARTAN POTASSIUM 50 MG PO TABS
50.0000 mg | ORAL_TABLET | Freq: Every day | ORAL | Status: DC
  Administered 2024-09-01: 50 mg via ORAL
  Filled 2024-08-31: qty 1

## 2024-08-31 MED ORDER — PHENOL 1.4 % MT LIQD: 1.0000 | OROMUCOSAL | Status: DC | PRN

## 2024-08-31 MED ORDER — EPHEDRINE SULFATE-NACL 50-0.9 MG/10ML-% IV SOSY
PREFILLED_SYRINGE | INTRAVENOUS | Status: DC | PRN
  Administered 2024-08-31: 10 mg via INTRAVENOUS

## 2024-08-31 MED ORDER — SODIUM CHLORIDE 0.9% FLUSH: 3.0000 mL | INTRAVENOUS | Status: DC | PRN

## 2024-08-31 MED ORDER — EPHEDRINE 5 MG/ML INJ
INTRAVENOUS | Status: AC
  Filled 2024-08-31: qty 5

## 2024-08-31 MED ORDER — LIDOCAINE 2% (20 MG/ML) 5 ML SYRINGE
INTRAMUSCULAR | Status: AC
  Filled 2024-08-31: qty 5

## 2024-08-31 MED ORDER — POTASSIUM CHLORIDE IN NACL 20-0.9 MEQ/L-% IV SOLN
INTRAVENOUS | Status: DC
  Filled 2024-08-31: qty 1000

## 2024-08-31 MED ORDER — CEFAZOLIN SODIUM-DEXTROSE 1-4 GM/50ML-% IV SOLN
1.0000 g | Freq: Three times a day (TID) | INTRAVENOUS | Status: AC
  Administered 2024-08-31 – 2024-09-01 (×2): 1 g via INTRAVENOUS
  Filled 2024-08-31 (×2): qty 50

## 2024-08-31 MED ORDER — ROCURONIUM BROMIDE 10 MG/ML (PF) SYRINGE
PREFILLED_SYRINGE | INTRAVENOUS | Status: DC | PRN
  Administered 2024-08-31: 20 mg via INTRAVENOUS
  Administered 2024-08-31: 60 mg via INTRAVENOUS
  Administered 2024-08-31 (×2): 20 mg via INTRAVENOUS

## 2024-08-31 MED ORDER — LACTATED RINGERS IV SOLN: INTRAVENOUS | Status: DC

## 2024-08-31 MED ORDER — SURGIPHOR WOUND IRRIGATION SYSTEM - OPTIME
TOPICAL | Status: DC | PRN
  Administered 2024-08-31: 450 mL

## 2024-08-31 MED ORDER — MENTHOL 3 MG MT LOZG: 1.0000 | LOZENGE | OROMUCOSAL | Status: DC | PRN

## 2024-08-31 MED ORDER — CHLORHEXIDINE GLUCONATE 0.12 % MT SOLN
15.0000 mL | Freq: Once | OROMUCOSAL | Status: AC
  Administered 2024-08-31: 15 mL via OROMUCOSAL
  Filled 2024-08-31: qty 15

## 2024-08-31 MED ORDER — FENTANYL CITRATE (PF) 100 MCG/2ML IJ SOLN
INTRAMUSCULAR | Status: AC
  Filled 2024-08-31: qty 2

## 2024-08-31 MED ORDER — KETOROLAC TROMETHAMINE 30 MG/ML IJ SOLN
INTRAMUSCULAR | Status: AC
  Filled 2024-08-31: qty 1

## 2024-08-31 MED ORDER — PROPOFOL 10 MG/ML IV BOLUS
INTRAVENOUS | Status: AC
  Filled 2024-08-31: qty 20

## 2024-08-31 MED ORDER — THROMBIN 20000 UNITS EX SOLR
CUTANEOUS | Status: DC | PRN
  Administered 2024-08-31: 20 mL via TOPICAL

## 2024-08-31 MED ORDER — KETOROLAC TROMETHAMINE 30 MG/ML IJ SOLN
INTRAMUSCULAR | Status: DC | PRN
  Administered 2024-08-31: 15 mg via INTRAVENOUS

## 2024-08-31 MED ORDER — SENNA 8.6 MG PO TABS
1.0000 | ORAL_TABLET | Freq: Two times a day (BID) | ORAL | Status: DC
  Administered 2024-08-31 – 2024-09-01 (×2): 8.6 mg via ORAL
  Filled 2024-08-31 (×2): qty 1

## 2024-08-31 MED ORDER — ONDANSETRON HCL 4 MG/2ML IJ SOLN
INTRAMUSCULAR | Status: AC
  Filled 2024-08-31: qty 2

## 2024-08-31 MED ORDER — BUPIVACAINE HCL (PF) 0.25 % IJ SOLN
INTRAMUSCULAR | Status: AC
  Filled 2024-08-31: qty 30

## 2024-08-31 MED ORDER — FENTANYL CITRATE (PF) 250 MCG/5ML IJ SOLN
INTRAMUSCULAR | Status: DC | PRN
  Administered 2024-08-31: 100 ug via INTRAVENOUS
  Administered 2024-08-31: 50 ug via INTRAVENOUS

## 2024-08-31 MED ORDER — ASPIRIN 81 MG PO TBEC
81.0000 mg | DELAYED_RELEASE_TABLET | Freq: Every day | ORAL | Status: DC
  Administered 2024-08-31 – 2024-09-01 (×2): 81 mg via ORAL
  Filled 2024-08-31 (×2): qty 1

## 2024-08-31 MED ORDER — SODIUM CHLORIDE 0.9% FLUSH
3.0000 mL | Freq: Two times a day (BID) | INTRAVENOUS | Status: DC
  Administered 2024-08-31: 3 mL via INTRAVENOUS

## 2024-08-31 MED ORDER — OXYCODONE HCL 5 MG PO TABS
5.0000 mg | ORAL_TABLET | ORAL | Status: DC | PRN
  Administered 2024-08-31 – 2024-09-01 (×3): 5 mg via ORAL
  Filled 2024-08-31 (×4): qty 1

## 2024-08-31 MED ORDER — CEFAZOLIN SODIUM-DEXTROSE 2-3 GM-%(50ML) IV SOLR
INTRAVENOUS | Status: DC | PRN
  Administered 2024-08-31: 2 g via INTRAVENOUS

## 2024-08-31 MED ORDER — ONDANSETRON HCL 4 MG/2ML IJ SOLN
INTRAMUSCULAR | Status: DC | PRN
  Administered 2024-08-31: 4 mg via INTRAVENOUS

## 2024-08-31 MED ORDER — ORAL CARE MOUTH RINSE: 15.0000 mL | Freq: Once | OROMUCOSAL | Status: AC

## 2024-08-31 MED ORDER — PHENYLEPHRINE HCL-NACL 20-0.9 MG/250ML-% IV SOLN
INTRAVENOUS | Status: DC | PRN
  Administered 2024-08-31: 40 ug/min via INTRAVENOUS

## 2024-08-31 SURGICAL SUPPLY — 45 items
BAG COUNTER SPONGE SURGICOUNT (BAG) ×1 IMPLANT
BASKET BONE COLLECTION (BASKET) IMPLANT
BENZOIN TINCTURE PRP APPL 2/3 (GAUZE/BANDAGES/DRESSINGS) ×1 IMPLANT
BLADE BONE MILL MEDIUM (MISCELLANEOUS) IMPLANT
BLADE CLIPPER SURG (BLADE) IMPLANT
BUR CARBIDE MATCH 3.0 (BURR) ×1 IMPLANT
CANISTER SUCTION 3000ML PPV (SUCTIONS) ×1 IMPLANT
CNTNR URN SCR LID CUP LEK RST (MISCELLANEOUS) ×1 IMPLANT
COVER BACK TABLE 60X90IN (DRAPES) ×1 IMPLANT
DERMABOND ADVANCED .7 DNX12 (GAUZE/BANDAGES/DRESSINGS) ×1 IMPLANT
DRAPE C-ARM 42X72 X-RAY (DRAPES) IMPLANT
DRAPE LAPAROTOMY 100X72X124 (DRAPES) ×1 IMPLANT
DRAPE SURG 17X23 STRL (DRAPES) ×1 IMPLANT
DRSG AQUACEL AG ADV 3.5X 6 (GAUZE/BANDAGES/DRESSINGS) ×1 IMPLANT
DRSG AQUACEL AG ADV 3.5X10 (GAUZE/BANDAGES/DRESSINGS) IMPLANT
DURAPREP 26ML APPLICATOR (WOUND CARE) ×1 IMPLANT
ELECTRODE REM PT RTRN 9FT ADLT (ELECTROSURGICAL) ×1 IMPLANT
EVACUATOR 1/8 PVC DRAIN (DRAIN) IMPLANT
GAUZE 4X4 16PLY ~~LOC~~+RFID DBL (SPONGE) IMPLANT
GLOVE BIO SURGEON STRL SZ7 (GLOVE) IMPLANT
GLOVE BIO SURGEON STRL SZ8 (GLOVE) ×2 IMPLANT
GLOVE BIOGEL PI IND STRL 7.0 (GLOVE) IMPLANT
GOWN STRL REUS W/ TWL LRG LVL3 (GOWN DISPOSABLE) IMPLANT
GOWN STRL REUS W/ TWL XL LVL3 (GOWN DISPOSABLE) ×2 IMPLANT
GOWN STRL REUS W/TWL 2XL LVL3 (GOWN DISPOSABLE) IMPLANT
HEMOSTAT POWDER KIT SURGIFOAM (HEMOSTASIS) ×1 IMPLANT
KIT BASIN OR (CUSTOM PROCEDURE TRAY) ×1 IMPLANT
KIT TURNOVER KIT B (KITS) ×1 IMPLANT
MILL BONE PREP (MISCELLANEOUS) IMPLANT
NEEDLE HYPO 25X1 1.5 SAFETY (NEEDLE) ×1 IMPLANT
PACK LAMINECTOMY NEURO (CUSTOM PROCEDURE TRAY) ×1 IMPLANT
PAD ARMBOARD POSITIONER FOAM (MISCELLANEOUS) ×3 IMPLANT
RASP 3.0MM (RASP) IMPLANT
SOLN 0.9% NACL POUR BTL 1000ML (IV SOLUTION) ×1 IMPLANT
SOLN STERILE WATER BTL 1000 ML (IV SOLUTION) ×1 IMPLANT
SOLUTION IRRIG SURGIPHOR (IV SOLUTION) ×1 IMPLANT
SPONGE SURGIFOAM ABS GEL 100 (HEMOSTASIS) ×1 IMPLANT
SPONGE T-LAP 4X18 ~~LOC~~+RFID (SPONGE) IMPLANT
STRIP CLOSURE SKIN 1/2X4 (GAUZE/BANDAGES/DRESSINGS) ×2 IMPLANT
SUT VIC AB 0 CT1 18XCR BRD8 (SUTURE) ×1 IMPLANT
SUT VIC AB 2-0 CP2 18 (SUTURE) ×1 IMPLANT
SUT VIC AB 3-0 SH 8-18 (SUTURE) ×2 IMPLANT
TOWEL GREEN STERILE (TOWEL DISPOSABLE) ×1 IMPLANT
TOWEL GREEN STERILE FF (TOWEL DISPOSABLE) ×1 IMPLANT
TRAY FOLEY MTR SLVR 16FR STAT (SET/KITS/TRAYS/PACK) IMPLANT

## 2024-08-31 NOTE — Transfer of Care (Signed)
 Immediate Anesthesia Transfer of Care Note  Patient: Richard Mueller [facility]  Procedure(s) Performed: Bilateral - Lumbar two-Lumbar three laminectomy, removal of hardware - Lumbar five-Sacral one-Sacral two with redo posterior lateral arthrodesis (Bilateral: Back)  Patient Location: PACU  Anesthesia Type:General  Level of Consciousness: awake, alert , and oriented  Airway & Oxygen Therapy: Patient Spontanous Breathing  Post-op Assessment: Report given to RN and Post -op Vital signs reviewed and stable  Post vital signs: Reviewed and stable  Last Vitals:  Vitals Value Taken Time  BP 90/68 08/31/24 12:58  Temp    Pulse 78 08/31/24 13:00  Resp 13 08/31/24 13:00  SpO2 93 % 08/31/24 13:00  Vitals shown include unfiled device data.  Last Pain:  Vitals:   08/31/24 0836  TempSrc:   PainSc: 5       Patients Stated Pain Goal: 4 (08/31/24 9163)  Complications: There were no known notable events for this encounter.

## 2024-08-31 NOTE — Anesthesia Procedure Notes (Signed)
 Procedure Name: Intubation Date/Time: 08/31/2024 10:14 AM  Performed by: Alen Motto D, CRNAPre-anesthesia Checklist: Patient identified, Emergency Drugs available, Suction available and Patient being monitored Patient Re-evaluated:Patient Re-evaluated prior to induction Oxygen Delivery Method: Circle System Utilized Preoxygenation: Pre-oxygenation with 100% oxygen Induction Type: IV induction Ventilation: Mask ventilation without difficulty Laryngoscope Size: Mac and 4 Grade View: Grade I Tube type: Oral Tube size: 7.5 mm Number of attempts: 1 Airway Equipment and Method: Stylet and Oral airway Placement Confirmation: ETT inserted through vocal cords under direct vision, positive ETCO2 and breath sounds checked- equal and bilateral Secured at: 22 cm Tube secured with: Tape Dental Injury: Teeth and Oropharynx as per pre-operative assessment

## 2024-08-31 NOTE — Anesthesia Postprocedure Evaluation (Signed)
"   Anesthesia Post Note  Patient: Richard Mueller [facility]  Procedure(s) Performed: Bilateral - Lumbar two-Lumbar three laminectomy, removal of hardware - Lumbar five-Sacral one-Sacral two with redo posterior lateral arthrodesis (Bilateral: Back)     Patient location during evaluation: PACU Anesthesia Type: General Level of consciousness: awake and alert Pain management: pain level controlled Vital Signs Assessment: post-procedure vital signs reviewed and stable Respiratory status: spontaneous breathing, nonlabored ventilation, respiratory function stable and patient connected to nasal cannula oxygen Cardiovascular status: blood pressure returned to baseline and stable Postop Assessment: no apparent nausea or vomiting Anesthetic complications: no   There were no known notable events for this encounter.  Last Vitals:  Vitals:   08/31/24 1500 08/31/24 1520  BP: 98/66 106/64  Pulse: 80 91  Resp: 18 16  Temp: 36.6 C 36.6 C  SpO2: 90% 94%    Last Pain:  Vitals:   08/31/24 1536  TempSrc:   PainSc: 5                  [name]      "

## 2024-08-31 NOTE — Op Note (Signed)
 08/31/2024  12:56 PM  PATIENT:  Richard Mueller  69 y.o. male  PRE-OPERATIVE DIAGNOSIS:  1.  Possible pseudoarthrosis L5-S1 with loosening of instrumentation, 2.  Lumbar spinal stenosis L2-3, 3.  Back pain with radiculopathy  POST-OPERATIVE DIAGNOSIS:  same  PROCEDURE:  1.  Lumbar reexploration with removal of segment fixation L5-S2 with exploration of fusion to assure solid arthrodesis, 2.  Decompressive lumbar hemilaminectomy medial facetectomy foraminotomies L2-3 on the right with sublaminar decompression  SURGEON:  Alm Molt, MD  ASSISTANTS: Suzen Pean, FNP  ANESTHESIA:   General  EBL: 200 ml  No intake/output data recorded.  BLOOD ADMINISTERED: none  DRAINS: None  SPECIMEN:  none  INDICATION FOR PROCEDURE: This patient presented with back pain and right leg pain. Imaging showed possible pseudoarthrosis L5-S1 and lumbar spinal stenosis L2-3. The patient tried conservative measures without relief. Pain was debilitating. Recommended decompressive laminectomy L2-3 with lumbar reexploration with removal of hardware L5 S2 with exploration of fusion and possible arthrodesis. Patient understood the risks, benefits, and alternatives and potential outcomes and wished to proceed.  PROCEDURE DETAILS: The patient was taken to the operating room and after induction of adequate generalized endotracheal anesthesia, the patient was rolled into the prone position on chest rolls and all pressure points were padded. The lumbar region was cleaned and then prepped with DuraPrep and draped in the usual sterile fashion. 5 cc of local anesthesia was injected and then a dorsal midline incision was made and carried down to the lumbo sacral fascia. The fascia was opened and the paraspinous musculature was taken down in a subperiosteal fashion to expose the previously placed instrumentation L5-S2.  We were able to remove the locking caps and remove the rods.  We had to cut the rod between S1 and  S2 on the left to get the rod out of the S2 screw head.  We removed the S1 and S2 screws bilaterally.  We explored the fusion and pulled on the L5 screw and everything moved as a unit bilaterally L5-S1.  This suggested solid arthrodesis L5-S1.  We then irrigated with saline solution and 0.5% povidone iodine solution.  We dried all bleeding points.  We closed the fascia with 0 Vicryl.  We closed the subcutaneous tissue with 2-0 Vicryl.  We closed the subcuticular tissue with 3-0 Vicryl.  The skin was closed with Dermabond in the skin and benzoin and Steri-Strips.   We then moved superiorly and made an incision at L2-3 and took down the musculature at L2-3 on the right in a subperiosteal fashion to expose the interlaminar space on the right. Intraoperative x-ray confirmed my level, and then I used a combination of the high-speed drill and the Kerrison punches to perform a hemilaminectomy, medial facetectomy, and foraminotomy at L2-3 on the right. The underlying yellow ligament was opened and removed in a piecemeal fashion to expose the underlying dura and exiting nerve root. I undercut the lateral recess and dissected down until I was medial to and distal to the pedicle. The nerve root was well decompressed.  We then drilled up under the spinous process and performed a sublaminar decompression to decompress the midline in the left lateral recess.  I then palpated with a coronary dilator along the nerve root and into the foramen to assure adequate decompression. I felt no more compression of the nerve root. I irrigated with saline solution containing bacitracin. Achieved hemostasis with bipolar cautery, lined the dura with Gelfoam, and then closed the fascia with 0  Vicryl. I closed the subcutaneous tissues with 2-0 Vicryl and the subcuticular tissues with 3-0 Vicryl. The skin was then closed with benzoin and Steri-Strips. The drapes were removed, a sterile dressing was applied.    Nurse practitioner was scrubbed  for the entirety of the case and helped with the exposure, the removal of the instrumentation, the exploration of the fusion, the hemilaminectomy, and the closure.    PLAN OF CARE: Admit for overnight observation  PATIENT DISPOSITION:  PACU - hemodynamically stable.   Delay start of Pharmacological VTE agent (>24hrs) due to surgical blood loss or risk of bleeding:  yes

## 2024-08-31 NOTE — H&P (Signed)
 Subjective: Patient is a 69 y.o. male admitted for back and leg pain. Onset of symptoms was several months ago, gradually worsening since that time.  The pain is rated severe, and is located at the across the lower back and radiates to leg. The pain is described as aching and occurs all day. The symptoms have been progressive. Symptoms are exacerbated by exercise, standing, and walking for more than a few minutes. MRI or CT showed possible pseudoarthrosis L5-S1 with lucency around instrumentation, and spinal stenosis L2-3  Past Medical History:  Diagnosis Date   Allergic rhinitis, cause unspecified    Allergy    Bronchitis    Cancer (HCC) 2020   skin cancer   Hyperlipidemia    Hypertension    Metabolic syndrome    Obesity, unspecified    Osteoarthritis    Osteoarthrosis, unspecified whether generalized or localized, unspecified site    Pneumonia    Right upper quadrant pain     Past Surgical History:  Procedure Laterality Date   APPENDECTOMY  April 2020   COLONOSCOPY WITH PROPOFOL  N/A 08/08/2020   Procedure: COLONOSCOPY WITH PROPOFOL ;  Surgeon: Jinny Carmine, MD;  Location: [facility];  Service: Endoscopy;  Laterality: N/A;  priority 4   LAPAROSCOPIC APPENDECTOMY N/A 11/28/2018   Procedure: APPENDECTOMY LAPAROSCOPIC;  Surgeon: Jordis Laneta FALCON, MD;  Location: [facility];  Service: General;  Laterality: N/A;   Open Lumbar Five-Sacral One replacement of loose screws, Lumbar Five-Pelvis posterolateral instrumented fusion  09/22/2021   Dr. Cheryle   ORTHOPEDIC SURGERY Right 1977   lower arm went through a glass door.   POLYPECTOMY  08/08/2020   Procedure: POLYPECTOMY;  Surgeon: Jinny Carmine, MD;  Location: [facility];  Service: Endoscopy;;   SKIN CANCER DESTRUCTION Left 03/06/2015   Hand- Dr. Arlyss   SKIN CANCER EXCISION Left 08/2018   Dr. Arlyss on Left Arm    SPINE SURGERY  February 2023   TRANSFORAMINAL LUMBAR INTERBODY FUSION W/ MIS 1 LEVEL Left 10/10/2020    Procedure: Right Lumbar Five Sacral One Minimally invasive transforaminal lumbar interbody fusion;  Surgeon: Cheryle Debby LABOR, MD;  Location: [facility];  Service: Neurosurgery;  Laterality: Left;  posterior    Prior to Admission medications  Medication Sig Start Date End Date Taking? Authorizing Provider  acetaminophen  (TYLENOL ) 500 MG tablet Take 1,000 mg by mouth every 6 (six) hours as needed (pain.).   Yes [provider]  Ascorbic Acid (VITAMIN C PO) Take 500 mg by mouth every evening.   Yes [provider]  aspirin  81 MG tablet Take 1 tablet (81 mg total) by mouth daily. Okay to restart on 09/30/21 09/23/21  Yes Ostergard, Debby LABOR, MD  atorvastatin  (LIPITOR) 40 MG tablet Take 1 tablet (40 mg total) by mouth daily. 06/14/24  Yes Darron Deatrice LABOR, MD  carvedilol  (COREG ) 3.125 MG tablet Take 1 tablet (3.125 mg total) by mouth 2 (two) times daily. 12/22/23 12/21/24 Yes Sowles, Krichna, MD  Cholecalciferol (VITAMIN D) 50 MCG (2000 UT) tablet Take 2,000 Units by mouth every evening.   Yes [provider]  ezetimibe  (ZETIA ) 10 MG tablet Take 1 tablet (10 mg total) by mouth daily. Patient taking differently: Take 10 mg by mouth every evening. 01/12/24 08/22/27 Yes Darron Deatrice LABOR, MD  losartan  (COZAAR ) 50 MG tablet Take 1 tablet (50 mg total) by mouth daily. 06/13/24  Yes Darron Deatrice LABOR, MD  MAGNESIUM  PO Take 1 tablet by mouth every evening.   Yes [provider]  methocarbamol  (ROBAXIN ) 500 MG tablet Take 500 mg by mouth every 6 (six) hours as needed for muscle spasms. 04/26/24  Yes [provider]  Multiple Vitamin (MULTIVITAMIN) tablet Take 1 tablet by mouth in the morning.   Yes [provider]  naproxen  (NAPROSYN ) 500 MG tablet Take 500 mg by mouth 2 (two) times daily as needed (pain.).   Yes [provider]  Polyethyl Glycol-Propyl Glycol (LUBRICANT EYE DROPS) 0.4-0.3 % SOLN Place 1-2 drops into both eyes 3 (three) times daily as needed  (dry/irritated eyes.).   Yes [provider]  tadalafil  (CIALIS ) 5 MG tablet Take 1 tablet (5 mg total) by mouth daily. Patient taking differently: Take 5 mg by mouth daily as needed for erectile dysfunction. 03/07/24   Sowles, Krichna, MD   Allergies[1]  Social History   Tobacco Use   Smoking status: Former    Current packs/day: 0.00    Average packs/day: 1 pack/day for 15.0 years (15.0 ttl pk-yrs)    Types: Cigarettes    Start date: 08/24/1987    Quit date: 08/26/2002    Years since quitting: 22.0   Smokeless tobacco: Never  Substance Use Topics   Alcohol use: Yes    Comment: Couple of beers a week socially    Family History  Problem Relation Age of Onset   Heart disease Mother    Heart attack Mother    COPD Mother    Heart disease Father    Hypertension Father    COPD Father    Cancer Father         Prostate and Skin   COPD Sister    Suicidality Brother    CAD Brother    Depression Daughter    Cirrhosis Daughter      Review of Systems  Positive ROS: Negative  All other systems have been reviewed and were otherwise negative with the exception of those mentioned in the HPI and as above.  Objective: Vital signs in last 24 hours: Temp:  [97.9 F (36.6 C)] 97.9 F (36.6 C) (01/09 0751) Pulse Rate:  [61] 61 (01/09 0751) Resp:  [17] 17 (01/09 0751) BP: (146)/(82) 146/82 (01/09 0751) SpO2:  [99 %] 99 % (01/09 0751) Weight:  [95.3 kg] 95.3 kg (01/09 0751)  General Appearance: Alert, cooperative, no distress, appears stated age Head: Normocephalic, without obvious abnormality, atraumatic Eyes: PERRL, conjunctiva/corneas clear, EOM's intact    Neck: Supple, symmetrical, trachea midline Back: Symmetric, no curvature, ROM normal, no CVA tenderness Lungs:  respirations unlabored Heart: Regular rate and rhythm Abdomen: Soft, non-tender Extremities: Extremities normal, atraumatic, no cyanosis or edema Pulses: 2+ and symmetric all extremities Skin: Skin color,  texture, turgor normal, no rashes or lesions  NEUROLOGIC:   Mental status: Alert and oriented x4,  no aphasia, good attention span, fund of knowledge, and memory Motor Exam - grossly normal Sensory Exam - grossly normal Reflexes: 1+ Coordination - grossly normal Gait -  not tested Balance -not tested Cranial Nerves: I: smell Not tested  II: visual acuity  OS: nl    OD: nl  II: visual fields Full to confrontation  II: pupils Equal, round, reactive to light  III,VII: ptosis None  III,IV,VI: extraocular muscles  Full ROM  V: mastication Normal  V: facial light touch sensation  Normal  V,VII: corneal reflex  Present  VII: facial muscle function - upper  Normal  VII: facial muscle function - lower Normal  VIII: hearing Not tested  IX: soft palate elevation  Normal  IX,X: gag reflex Present  XI: trapezius strength  5/5  XI: sternocleidomastoid strength 5/5  XI: neck flexion strength  5/5  XII: tongue strength  Normal    Data Review Lab Results  Component Value Date   WBC 5.7 08/28/2024   HGB 13.9 08/28/2024   HCT 41.3 08/28/2024   MCV 91.2 08/28/2024   PLT 216 08/28/2024   Lab Results  Component Value Date   NA 133 (L) 08/28/2024   K 4.4 08/28/2024   CL 100 08/28/2024   CO2 26 08/28/2024   BUN 16 08/28/2024   CREATININE 0.92 08/28/2024   GLUCOSE 90 08/28/2024   No results found for: INR, PROTIME  Assessment/Plan:  Estimated body mass index is 31.93 kg/m as calculated from the following:   Height as of this encounter: 5' 8 (1.727 m).   Weight as of this encounter: 95.3 kg. Patient admitted for back pain with radiculopathy.  Plan is for decompressive laminectomy L2-3 to address his spinal stenosis and exploration of his fusion at L5-S2 with removal of instrumentation and possible posterolateral arthrodesis.  Patient has failed a reasonable attempt at conservative therapy.  I explained the condition and procedure to the patient and answered any questions.   Patient wishes to proceed with procedure as planned. Understands risks/ benefits and typical outcomes of procedure.   Alm GORMAN Molt 08/31/2024 9:28 AM      [1]  Allergies Allergen Reactions   Lisinopril Rash    pins and needles in arms

## 2024-09-01 ENCOUNTER — Encounter (HOSPITAL_COMMUNITY): Payer: Self-pay | Admitting: Neurological Surgery

## 2024-09-01 DIAGNOSIS — T84296A Other mechanical complication of internal fixation device of vertebrae, initial encounter: Secondary | ICD-10-CM | POA: Diagnosis not present

## 2024-09-01 MED ORDER — OXYCODONE HCL 5 MG PO TABS: 5.0000 mg | ORAL_TABLET | ORAL | 0 refills | Status: AC | PRN

## 2024-09-01 NOTE — Progress Notes (Signed)
 Patient alert and oriented, voided, ambulate. Surgical site clean and dry no sign of infection. D/c instructions explain and given, all questions answered.

## 2024-09-01 NOTE — Evaluation (Signed)
 Occupational Therapy Evaluation Patient Details Name: Richard Mueller MRN: [id number] DOB: 20-Jun-1956 Today's Date: 09/01/2024   History of Present Illness   Pt is a 69 y.o. male s/p bil L2-3 laminectomy with removal of hardware L5-S2 with redo posterior lateral arthrodesis. PMH: cancer, HLD, HTN, osteoarthritis, PNA     Clinical Impressions PTA, pt living with wife and daughter and reports being independent. Upon eval, pt performing BADL with mod I. Pt educated and demonstrating use of compensatory techniques for bed mobility, LB ADL, grooming, toileting, shower transfers, and stair training within precautions. Reviewed recommendations for exercise vs rest, car transfers. All education provided and questions answered. Recommending discharge with no OT follow up. OT to sign off. Please re-consult if change in status.        If plan is discharge home, recommend the following:   Other (comment) (on pt request)     Functional Status Assessment   Patient has had a recent decline in their functional status and demonstrates the ability to make significant improvements in function in a reasonable and predictable amount of time.     Equipment Recommendations   None recommended by OT     Recommendations for Other Services         Precautions/Restrictions   Precautions Precautions: Back Precaution Booklet Issued: Yes (comment) Recall of Precautions/Restrictions: Intact Required Braces or Orthoses:  (no brace needed orders) Restrictions Weight Bearing Restrictions Per Provider Order: No     Mobility Bed Mobility Overal bed mobility: Independent                  Transfers Overall transfer level: Independent                        Balance Overall balance assessment: Modified Independent                                         ADL either performed or assessed with clinical judgement   ADL Overall ADL's : Modified  independent                                             Vision Baseline Vision/History: 1 Wears glasses Ability to See in Adequate Light: 0 Adequate Patient Visual Report: No change from baseline Vision Assessment?: No apparent visual deficits     Perception Perception: Within Functional Limits       Praxis Praxis: WFL       Pertinent Vitals/Pain Pain Assessment Pain Assessment: Faces Faces Pain Scale: Hurts a little bit Pain Location: surgical site Pain Descriptors / Indicators: Guarding Pain Intervention(s): Monitored during session     Extremity/Trunk Assessment Upper Extremity Assessment Upper Extremity Assessment: Overall WFL for tasks assessed   Lower Extremity Assessment Lower Extremity Assessment: Overall WFL for tasks assessed   Cervical / Trunk Assessment Cervical / Trunk Assessment: Back Surgery   Communication Communication Communication: No apparent difficulties   Cognition Arousal: Alert Behavior During Therapy: WFL for tasks assessed/performed Cognition: No apparent impairments                               Following commands: Intact       Cueing  General Comments  VSS   Exercises     Shoulder Instructions      Home Living Family/patient expects to be discharged to:: Private residence Living Arrangements: Spouse/significant other;Children (dtr recently staying after a liver transplant in September) Available Help at Discharge: Family;Available 24 hours/day Type of Home: House Home Access: Stairs to enter [employer] of Steps: 4-5 Entrance Stairs-Rails: Can reach both;Left;Right Home Layout: One level     Bathroom Shower/Tub: Chief Strategy Officer: Standard     Home Equipment: None          Prior Functioning/Environment Prior Level of Function : Independent/Modified Independent             Mobility Comments: no AD      OT Problem List: Impaired balance  (sitting and/or standing);Decreased activity tolerance;Decreased strength;Pain;Decreased knowledge of precautions   OT Treatment/Interventions:        OT Goals(Current goals can be found in the care plan section)   Acute Rehab OT Goals Patient Stated Goal: go home OT Goal Formulation: With patient Time For Goal Achievement: 09/15/24 Potential to Achieve Goals: Good   OT Frequency:       Co-evaluation              AM-PAC OT 6 Clicks Daily Activity     Outcome Measure Help from another person eating meals?: None Help from another person taking care of personal grooming?: None Help from another person toileting, which includes using toliet, bedpan, or urinal?: None Help from another person bathing (including washing, rinsing, drying)?: None Help from another person to put on and taking off regular upper body clothing?: None Help from another person to put on and taking off regular lower body clothing?: None 6 Click Score: 24   End of Session Equipment Utilized During Treatment: Gait belt Nurse Communication: Mobility status  Activity Tolerance: Patient tolerated treatment well Patient left: Other (comment) (standing in room speaking with Dr. Joshua)  OT Visit Diagnosis: Pain;Muscle weakness (generalized) (M62.81) Pain - part of body:  (back)                Time: 9250-9188 OT Time Calculation (min): 22 min Charges:  OT General Charges $OT Visit: 1 Visit OT Evaluation $OT Eval Low Complexity: 1 Low  Elma JONETTA Lebron FREDERICK, OTR/L [facility] Acute Rehabilitation Office: [phone]   Elma JONETTA Lebron 09/01/2024, 8:56 AM

## 2024-09-01 NOTE — Discharge Summary (Signed)
 Physician Discharge Summary  Patient ID: Richard Mueller MRN: [id number] DOB/AGE: [birth year] 69 y.o.  Admit date: 08/31/2024 Discharge date: 09/01/2024  Admission Diagnoses: lumbar stenosis, loosened painful hardware    Discharge Diagnoses: same   Discharged Condition: good  Hospital Course: The patient was admitted on 08/31/2024 and taken to the operating room where the patient underwent LL L2-3 and removal of instrumentation L5-S2. The patient tolerated the procedure well and was taken to the recovery room and then to the floor in stable condition. The hospital course was routine. There were no complications. The wound remained clean dry and intact. Pt had appropriate back soreness. No complaints of leg pain or new N/T/W. The patient remained afebrile with stable vital signs, and tolerated a regular diet. The patient continued to increase activities, and pain was well controlled with oral pain medications.   Consults: None  Significant Diagnostic Studies:  Results for orders placed or performed during the hospital encounter of 08/28/24  Surgical pcr screen   Collection Time: 08/28/24  9:03 AM   Specimen: Nasal Mucosa; Nasal Swab  Result Value Ref Range   MRSA, PCR NEGATIVE NEGATIVE   Staphylococcus aureus NEGATIVE NEGATIVE  Basic metabolic panel per protocol   Collection Time: 08/28/24  9:03 AM  Result Value Ref Range   Sodium 133 (L) 135 - 145 mmol/L   Potassium 4.4 3.5 - 5.1 mmol/L   Chloride 100 98 - 111 mmol/L   CO2 26 22 - 32 mmol/L   Glucose, Bld 90 70 - 99 mg/dL   BUN 16 8 - 23 mg/dL   Creatinine, Ser 9.07 0.61 - 1.24 mg/dL   Calcium  8.6 (L) 8.9 - 10.3 mg/dL   GFR, Estimated >39 >39 mL/min   Anion gap 7 5 - 15  CBC per protocol   Collection Time: 08/28/24  9:03 AM  Result Value Ref Range   WBC 5.7 4.0 - 10.5 K/uL   RBC 4.53 4.22 - 5.81 MIL/uL   Hemoglobin 13.9 13.0 - 17.0 g/dL   HCT 58.6 60.9 - 47.9 %   MCV 91.2 80.0 - 100.0 fL   MCH 30.7 26.0 - 34.0 pg    MCHC 33.7 30.0 - 36.0 g/dL   RDW 86.7 88.4 - 84.4 %   Platelets 216 150 - 400 K/uL   nRBC 0.0 0.0 - 0.2 %    DG Lumbar Spine 2-3 Views Result Date: 08/31/2024 CLINICAL DATA:  Elective surgery. EXAM: LUMBAR SPINE - 2-3 VIEW COMPARISON:  Preoperative imaging FINDINGS: Single lateral fluoroscopic spot view of the lumbar spine submitted from the operating room. There are pedicle screws at L5 with interbody spacer at L5-S1. Surgical instrument localizes posteriorly at the upper L3 level. Fluoroscopy time 7 seconds. Dose 5.11 mGy. IMPRESSION: Intraoperative fluoroscopy during lumbar spine surgery. Electronically Signed   By: Andrea Gasman M.D.   On: 08/31/2024 16:11   DG C-Arm 1-60 Min-No Report Result Date: 08/31/2024 Fluoroscopy was utilized by the requesting physician.  No radiographic interpretation.   DG C-Arm 1-60 Min-No Report Result Date: 08/31/2024 Fluoroscopy was utilized by the requesting physician.  No radiographic interpretation.   VAS US  CAROTID Result Date: 08/15/2024 Carotid Arterial Duplex Study Patient Name:  Richard Mueller [facility] Mueller  Date of Exam:   08/15/2024 Medical Rec #: [id number]                 Accession #:    [id number] Date of Birth: [birth year]  Patient Gender: M Patient Age:   69 years Exam Location:  [place] Procedure:      VAS US  CAROTID Referring Phys: [facility] ARIDA --------------------------------------------------------------------------------  Indications:       Right bruit and right arm Numbness. Risk Factors:      Hypertension, hyperlipidemia, past history of smoking,                    coronary artery disease. Other Factors:     Patient's right arm fell asleep during the exam. The patient                    said that this happens a lot. Comparison Study:  No previous on record Performing Technologist: Arley Pac RDMS, RVT, RDCS  Examination Guidelines: A complete evaluation includes B-mode imaging, spectral Doppler, color Doppler, and power Doppler  as needed of all accessible portions of each vessel. Bilateral testing is considered an integral part of a complete examination. Limited examinations for reoccurring indications may be performed as noted.  Right Carotid Findings: +----------+--------+--------+--------+-----------------------------+--------+           PSV cm/sEDV cm/sStenosisPlaque Description           Comments +----------+--------+--------+--------+-----------------------------+--------+ CCA Prox  31      13                                                    +----------+--------+--------+--------+-----------------------------+--------+ CCA Distal33      20              focal, hyperechoic and smooth         +----------+--------+--------+--------+-----------------------------+--------+ ICA Prox  17      9       1-39%   heterogenous and focal                +----------+--------+--------+--------+-----------------------------+--------+ ICA Mid   32      14                                                    +----------+--------+--------+--------+-----------------------------+--------+ ICA Distal31      12                                                    +----------+--------+--------+--------+-----------------------------+--------+ ECA       47      19                                                    +----------+--------+--------+--------+-----------------------------+--------+ +----------+--------+-------+----------------+-------------------+           PSV cm/sEDV cmsDescribe        Arm Pressure (mmHG) +----------+--------+-------+----------------+-------------------+ [id number]     22     Multiphasic, WNL98                  +----------+--------+-------+----------------+-------------------+ +---------+--------+--+--------+----------+ VertebralPSV cm/s65EDV cm/sRetrograde +---------+--------+--+--------+----------+ Tardus parvus waveforms throughout the carotid system including  the  subclavian artery Homogeneous vascularized area in the thyroid meas 1.0 x 0.8 cm  rounded monophasic waveforms in the brachial artery Left Carotid Findings: +----------+--------+--------+--------+------------------------------+--------+           PSV cm/sEDV cm/sStenosisPlaque Description            Comments +----------+--------+--------+--------+------------------------------+--------+ CCA Prox  142     29                                                     +----------+--------+--------+--------+------------------------------+--------+ CCA Distal95      23              hyperechoic and focal                  +----------+--------+--------+--------+------------------------------+--------+ ICA Prox  106     19              focal, smooth and heterogenous         +----------+--------+--------+--------+------------------------------+--------+ ICA Mid   169     24                                                     +----------+--------+--------+--------+------------------------------+--------+ ICA Distal96      32                                                     +----------+--------+--------+--------+------------------------------+--------+ ECA       99      18                                                     +----------+--------+--------+--------+------------------------------+--------+ +----------+--------+--------+----------------+-------------------+           PSV cm/sEDV cm/sDescribe        Arm Pressure (mmHG) +----------+--------+--------+----------------+-------------------+ Subclavian206     20      Multiphasic, [id number]                 +----------+--------+--------+----------------+-------------------+ +---------+--------+--+--------+--+---------+ VertebralPSV cm/s82EDV cm/s23Antegrade +---------+--------+--+--------+--+---------+ Homogeneous vascularized area in the thyroid meas 0.6 x 0.4 cm Triphasic waveforms seen in the brachial artery   Summary: Right Carotid: Velocities in the right ICA are consistent with a 1-39% stenosis.                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 Imaging                could not be obtained at the innominate level. Left Carotid: Velocities in the left ICA are consistent with a 1-39% stenosis. Vertebrals:  Left vertebral artery demonstrates antegrade flow. Right vertebral              artery demonstrates retrograde flow. Subclavians: Normal flow hemodynamics were seen in bilateral subclavian              arteries. *See table(s) above for measurements and observations.  Electronically signed by Maude Emmer  MD on 08/15/2024 at 1:02:10 PM.    Final     Antibiotics:  Anti-infectives (From admission, onward)    Start     Dose/Rate Route Frequency Ordered Stop   08/31/24 1900  ceFAZolin  (ANCEF ) IVPB 1 g/50 mL premix        1 g 100 mL/hr over 30 Minutes Intravenous Every 8 hours 08/31/24 1515 09/01/24 0820   08/31/24 0824  ceFAZolin  (ANCEF ) 2-4 GM/100ML-% IVPB       Note to Pharmacy: Barron Friday D: cabinet override      08/31/24 0824 08/31/24 2029       Discharge Exam: Blood pressure (!) 141/65, pulse 65, temperature 97.9 F (36.6 C), temperature source Oral, resp. rate 20, height 5' 8 (1.727 m), weight 95.3 kg, SpO2 100%. Neurologic: Grossly normal Dressing dry  Discharge Medications:   Allergies as of 09/01/2024       Reactions   Lisinopril Rash   pins and needles in arms        Medication List     TAKE these medications    acetaminophen  500 MG tablet Commonly known as: TYLENOL  Take 1,000 mg by mouth every 6 (six) hours as needed (pain.).   aspirin  81 MG tablet Take 1 tablet (81 mg total) by mouth daily. Okay to restart on 09/30/21   atorvastatin  40 MG tablet Commonly known as: LIPITOR Take 1 tablet (40 mg total) by mouth daily.   carvedilol  3.125 MG tablet Commonly known as: COREG  Take 1 tablet (3.125 mg total) by mouth 2 (two) times daily.   ezetimibe   10 MG tablet Commonly known as: ZETIA  Take 1 tablet (10 mg total) by mouth daily. What changed: when to take this   losartan  50 MG tablet Commonly known as: COZAAR  Take 1 tablet (50 mg total) by mouth daily.   Lubricant Eye Drops 0.4-0.3 % Soln Generic drug: Polyethyl Glycol-Propyl Glycol Place 1-2 drops into both eyes 3 (three) times daily as needed (dry/irritated eyes.).   MAGNESIUM  PO Take 1 tablet by mouth every evening.   methocarbamol  500 MG tablet Commonly known as: ROBAXIN  Take 500 mg by mouth every 6 (six) hours as needed for muscle spasms.   multivitamin tablet Take 1 tablet by mouth in the morning.   naproxen  500 MG tablet Commonly known as: NAPROSYN  Take 500 mg by mouth 2 (two) times daily as needed (pain.).   oxyCODONE  5 MG immediate release tablet Commonly known as: Oxy IR/ROXICODONE  Take 1 tablet (5 mg total) by mouth every 4 (four) hours as needed for moderate pain (pain score 4-6).   tadalafil  5 MG tablet Commonly known as: CIALIS  Take 1 tablet (5 mg total) by mouth daily. What changed:  when to take this reasons to take this   VITAMIN C PO Take 500 mg by mouth every evening.   Vitamin D 50 MCG (2000 UT) tablet Take 2,000 Units by mouth every evening.        Disposition: home   Final Dx: LL L2-3, removal hardware L5-S2  Discharge Instructions      Remove dressing in 72 hours   Complete by: As directed    Call MD for:  difficulty breathing, headache or visual disturbances   Complete by: As directed    Call MD for:  persistant nausea and vomiting   Complete by: As directed    Call MD for:  redness, tenderness, or signs of infection (pain, swelling, redness, odor or green/yellow discharge around incision site)   Complete by: As  directed    Call MD for:  severe uncontrolled pain   Complete by: As directed    Call MD for:  temperature >100.4   Complete by: As directed    Increase activity slowly   Complete by: As directed            Signed: Alm GORMAN Molt 09/01/2024, 8:22 AM

## 2024-09-07 LAB — OPHTHALMOLOGY REPORT-SCANNED

## 2024-09-18 ENCOUNTER — Encounter: Payer: Self-pay | Admitting: Cardiovascular Disease

## 2024-09-18 ENCOUNTER — Ambulatory Visit: Attending: Cardiovascular Disease | Admitting: Cardiovascular Disease

## 2024-09-18 VITALS — BP 130/70 | HR 62 | Ht 68.0 in | Wt 219.6 lb

## 2024-09-18 DIAGNOSIS — R002 Palpitations: Secondary | ICD-10-CM | POA: Insufficient documentation

## 2024-09-18 DIAGNOSIS — I2510 Atherosclerotic heart disease of native coronary artery without angina pectoris: Secondary | ICD-10-CM | POA: Diagnosis not present

## 2024-09-18 DIAGNOSIS — E785 Hyperlipidemia, unspecified: Secondary | ICD-10-CM | POA: Insufficient documentation

## 2024-09-18 DIAGNOSIS — I10 Essential (primary) hypertension: Secondary | ICD-10-CM | POA: Insufficient documentation

## 2024-09-18 DIAGNOSIS — I70218 Atherosclerosis of native arteries of extremities with intermittent claudication, other extremity: Secondary | ICD-10-CM | POA: Insufficient documentation

## 2024-09-18 NOTE — Progress Notes (Signed)
 "    Cardiology Office Note   Date:  09/18/2024   ID:  Richard Mueller, DOB [birth year], MRN [id number]  PCP:  Richard Mire, MD  Cardiologist:   Richard Cage, MD   Chief Complaint  Patient presents with   Follow-up     follow up  for Carotid duplex abnormal results.  pt has been doing well with no complaints of chest pain, chest pressure or SOB, medication reviewed verbally with patient .       History of Present Illness: Richard Mueller is a 69 y.o. male who presents for a follow-up regarding moderate nonobstructive coronary artery disease and palpitations.  He does have a family history of CAD.  He is known to have aortic atherosclerosis on previous CT imaging. He has history of palpitations.  ZIO monitor in 2021 showed 1 episode of wide-complex tachycardia lasting only 4 beats and was likely due to SVT with aberrancy although slow nonsustained ventricular tachycardia could not be excluded.  He was also noted to have short runs of SVT the longest lasted 16 beats.  Echocardiogram in August 2021 showed normal LV systolic function with mild mitral regurgitation and no significant pulmonary hypertension.  Cardiac CTA in September 2021 showed a calcium  score of 857 with evidence of moderate stenosis in the proximal LAD as well as mild left circumflex disease.  None of the lesions were significant by FFR.  He had lumbar spine surgery 3 times in the past most recently earlier this month.  He was seen in November for pulsatile tinnitus.  He underwent carotid Doppler which showed mild nonobstructive disease in the carotid arteries.  He was noted to have decreased pressure in the right arm compared to the left with retrograde flow in the right vertebral artery.  He reports that his right arm easy with occasional numbness but no severe pain with activities.  In addition, he denies dizziness or nausea.  He is mostly bothered by the pulsating noise in his ears which is on both  sides.    Past Medical History:  Diagnosis Date   Allergic rhinitis, cause unspecified    Allergy    Bronchitis    Cancer (HCC) 2020   skin cancer   Hyperlipidemia    Hypertension    Metabolic syndrome    Obesity, unspecified    Osteoarthritis    Osteoarthrosis, unspecified whether generalized or localized, unspecified site    Pneumonia    Right upper quadrant pain     Past Surgical History:  Procedure Laterality Date   APPENDECTOMY  April 2020   COLONOSCOPY WITH PROPOFOL  N/A 08/08/2020   Procedure: COLONOSCOPY WITH PROPOFOL ;  Surgeon: Richard Carmine, MD;  Location: [facility];  Service: Endoscopy;  Laterality: N/A;  priority 4   LAMINECTOMY WITH POSTERIOR LATERAL ARTHRODESIS LEVEL 2 Bilateral 08/31/2024   Procedure: Bilateral - Lumbar two-Lumbar three laminectomy, removal of hardware - Lumbar five-Sacral one-Sacral two with redo posterior lateral arthrodesis;  Surgeon: Richard Alm Hamilton, MD;  Location: [facility] OR;  Service: Neurosurgery;  Laterality: Bilateral;   LAPAROSCOPIC APPENDECTOMY N/A 11/28/2018   Procedure: APPENDECTOMY LAPAROSCOPIC;  Surgeon: Richard Laneta FALCON, MD;  Location: [facility] ORS;  Service: General;  Laterality: N/A;   Open Lumbar Five-Sacral One replacement of loose screws, Lumbar Five-Pelvis posterolateral instrumented fusion  09/22/2021   Dr. Cheryle   ORTHOPEDIC SURGERY Right 1977   lower arm went through a glass door.   POLYPECTOMY  08/08/2020   Procedure: POLYPECTOMY;  Surgeon: Richard Carmine,  MD;  Location: [facility];  Service: Endoscopy;;   SKIN CANCER DESTRUCTION Left 03/06/2015   Hand- Dr. Arlyss   SKIN CANCER EXCISION Left 08/2018   Dr. Arlyss on Left Arm    SPINE SURGERY  February 2023   TRANSFORAMINAL LUMBAR INTERBODY FUSION W/ MIS 1 LEVEL Left 10/10/2020   Procedure: Right Lumbar Five Sacral One Minimally invasive transforaminal lumbar interbody fusion;  Surgeon: Richard Mueller LABOR, MD;  Location: [facility] OR;  Service: Neurosurgery;   Laterality: Left;  posterior     Current Outpatient Medications  Medication Sig Dispense Refill   acetaminophen  (TYLENOL ) 500 MG tablet Take 1,000 mg by mouth every 6 (six) hours as needed (pain.).     Ascorbic Acid (VITAMIN C PO) Take 500 mg by mouth every evening.     aspirin  81 MG tablet Take 1 tablet (81 mg total) by mouth daily. Okay to restart on 09/30/21 30 tablet    atorvastatin  (LIPITOR) 40 MG tablet Take 1 tablet (40 mg total) by mouth daily. 90 tablet 1   carvedilol  (COREG ) 3.125 MG tablet Take 1 tablet (3.125 mg total) by mouth 2 (two) times daily. 180 tablet 3   Cholecalciferol (VITAMIN D) 50 MCG (2000 UT) tablet Take 2,000 Units by mouth every evening.     ezetimibe  (ZETIA ) 10 MG tablet Take 1 tablet (10 mg total) by mouth daily. (Patient taking differently: Take 10 mg by mouth every evening.) 90 tablet 3   losartan  (COZAAR ) 50 MG tablet Take 1 tablet (50 mg total) by mouth daily. 90 tablet 1   MAGNESIUM  PO Take 1 tablet by mouth every evening.     methocarbamol  (ROBAXIN ) 500 MG tablet Take 500 mg by mouth every 6 (six) hours as needed for muscle spasms.     Multiple Vitamin (MULTIVITAMIN) tablet Take 1 tablet by mouth in the morning.     naproxen  (NAPROSYN ) 500 MG tablet Take 500 mg by mouth 2 (two) times daily as needed (pain.).     Polyethyl Glycol-Propyl Glycol (LUBRICANT EYE DROPS) 0.4-0.3 % SOLN Place 1-2 drops into both eyes 3 (three) times daily as needed (dry/irritated eyes.).     tadalafil  (CIALIS ) 5 MG tablet Take 1 tablet (5 mg total) by mouth daily. (Patient taking differently: Take 5 mg by mouth daily as needed for erectile dysfunction.) 90 tablet 1   oxyCODONE  (OXY IR/ROXICODONE ) 5 MG immediate release tablet Take 1 tablet (5 mg total) by mouth every 4 (four) hours as needed for moderate pain (pain score 4-6). (Patient not taking: Reported on 09/18/2024) 30 tablet 0   No current facility-administered medications for this visit.    Allergies:   Lisinopril     Social History:  The patient  reports that he quit smoking about 22 years ago. His smoking use included cigarettes. He started smoking about 37 years ago. He has a 15 pack-year smoking history. He has never used smokeless tobacco. He reports current alcohol use. He reports that he does not use drugs.   Family History:  The patient's family history includes CAD in his brother; COPD in his father, mother, and sister; Cancer in his father; Cirrhosis in his daughter; Depression in his daughter; Heart attack in his mother; Heart disease in his father and mother; Hypertension in his father; Suicidality in his brother.    ROS:  Please see the history of present illness.   Otherwise, review of systems are positive for none.   All other systems are reviewed and negative.  PHYSICAL EXAM: VS:  BP 130/70 (BP Location: Left Arm, Patient Position: Sitting, Cuff Size: Normal)   Pulse 62   Ht 5' 8 (1.727 m)   Wt 219 lb 9.6 oz (99.6 kg)   SpO2 98%   BMI 33.39 kg/m  , BMI Body mass index is 33.39 kg/m. GEN: Well nourished, well developed, in no acute distress  HEENT: normal  Neck: no JVD, or masses.  Right carotid bruit Cardiac: RRR; no murmurs, rubs, or gallops,no edema  Respiratory:  clear to auscultation bilaterally, normal work of breathing GI: soft, nontender, nondistended, + BS MS: no deformity or atrophy  Skin: warm and dry, no rash Neuro:  Strength and sensation are intact Psych: euthymic mood, full affect Vascular: Pulses in the right arm are +1.  Normal pulses in the left arm.  EKG:  EKG is ordered today. The ekg ordered today demonstrates : Normal sinus rhythm Right bundle branch block Left anterior fascicular block Bifascicular block Minimal voltage criteria for LVH, may be normal variant ( R in aVL ) When compared with ECG of 13-Jul-2024 10:24, T wave inversion no longer evident in Anterior leads    Recent Labs: 03/05/2024: ALT 17 08/28/2024: BUN 16; Creatinine, Ser 0.92;  Hemoglobin 13.9; Platelets 216; Potassium 4.4; Sodium 133    Lipid Panel    Component Value Date/Time   CHOL 98 (L) 03/05/2024 0836   TRIG 54 03/05/2024 0836   TRIG 108 02/27/2013 1609   HDL 44 03/05/2024 0836   HDL 42 02/27/2013 1609   CHOLHDL 2.2 03/05/2024 0836   CHOLHDL 3.1 09/06/2023 0755   VLDL 13 09/06/2023 0755   LDLCALC 41 03/05/2024 0836   LDLCALC 60 06/18/2020 0834   LDLCALC 76 02/27/2013 1609      Wt Readings from Last 3 Encounters:  09/18/24 219 lb 9.6 oz (99.6 kg)  08/31/24 210 lb (95.3 kg)  08/28/24 219 lb 1.6 oz (99.4 kg)        ASSESSMENT AND PLAN:   1.  Right innominate/subclavian artery stenosis: He has retrograde flow in the right vertebral artery but overall mild ischemic symptoms.  Pulsatile tinnitus could be related to this given the presence of February in the right upper chest.   I discussed with him the indication for revascularization which is mainly driven by symptoms.  His symptoms do not seem to be lifestyle limiting and thus we will continue medical therapy for now.  I asked him to monitor his symptoms and let me know if there is worsening. His blood pressure should always be checked in the left arm which is more reflective of his central aortic pressure.  2.  Coronary artery disease involving native coronary arteries without angina: Known moderate nonobstructive coronary artery disease on previous CTA.  Currently with no anginal symptoms.  Continue treatment of risk factors and low-dose aspirin  81 mg daily.  3. Palpitations: Short runs of SVT noted on previous monitor.  Symptoms are well controlled on small dose carvedilol .  5.  Hyperlipidemia: He is doing well with atorvastatin  and ezetimibe .  Most recent lipid profile showed an LDL of 41.   6. Essential hypertension: Blood pressure is well-controlled on current medications.     Disposition:   FU with me in 6 months  Signed,  Richard Cage, MD  09/18/2024 3:09 PM    [facility] "

## 2024-09-18 NOTE — Patient Instructions (Signed)
 Medication Instructions:  No changes *If you need a refill on your cardiac medications before your next appointment, please call your pharmacy*  Lab Work: None ordered If you have labs (blood work) drawn today and your tests are completely normal, you will receive your results only by: MyChart Message (if you have MyChart) OR A paper copy in the mail If you have any lab test that is abnormal or we need to change your treatment, we will call you to review the results.  Testing/Procedures: None ordered  Follow-Up: At [facility], you and your health needs are our priority.  As part of our continuing mission to provide you with exceptional heart care, our providers are all part of one team.  This team includes your primary Cardiologist (physician) and Advanced Practice Providers or APPs (Physician Assistants and Nurse Practitioners) who all work together to provide you with the care you need, when you need it.  Your next appointment:   6 month(s)  Provider:   You may see Dr. Alvenia Aus or one of the following Advanced Practice Providers on your designated Care Team:   Laneta Pintos, NP Gildardo Labrador, PA-C Varney Gentleman, PA-C Cadence [place], PA-C Ronald Cockayne, NP Morey Ar, NP    We recommend signing up for the patient portal called MyChart.  Sign up information is provided on this After Visit Summary.  MyChart is used to connect with patients for Virtual Visits (Telemedicine).  Patients are able to view lab/test results, encounter notes, upcoming appointments, etc.  Non-urgent messages can be sent to your provider as well.   To learn more about what you can do with MyChart, go to [email or web address].

## 2024-12-21 ENCOUNTER — Ambulatory Visit: Admitting: Family Medicine

## 2025-03-28 ENCOUNTER — Ambulatory Visit
